# Patient Record
Sex: Female | Born: 1955
Health system: Southern US, Community
[De-identification: ages and names within clinical notes are randomized; demographics above are authoritative.]

## PROBLEM LIST (undated history)

## (undated) DIAGNOSIS — K635 Polyp of colon: Secondary | ICD-10-CM

## (undated) DIAGNOSIS — M779 Enthesopathy, unspecified: Secondary | ICD-10-CM

## (undated) DIAGNOSIS — N611 Abscess of the breast and nipple: Secondary | ICD-10-CM

## (undated) DIAGNOSIS — Z0000 Encounter for general adult medical examination without abnormal findings: Secondary | ICD-10-CM

## (undated) DIAGNOSIS — H539 Unspecified visual disturbance: Secondary | ICD-10-CM

## (undated) DIAGNOSIS — M79672 Pain in left foot: Secondary | ICD-10-CM

## (undated) DIAGNOSIS — D126 Benign neoplasm of colon, unspecified: Secondary | ICD-10-CM

## (undated) DIAGNOSIS — E162 Hypoglycemia, unspecified: Secondary | ICD-10-CM

## (undated) DIAGNOSIS — D649 Anemia, unspecified: Secondary | ICD-10-CM

## (undated) DIAGNOSIS — M1990 Unspecified osteoarthritis, unspecified site: Secondary | ICD-10-CM

## (undated) DIAGNOSIS — F329 Major depressive disorder, single episode, unspecified: Secondary | ICD-10-CM

## (undated) DIAGNOSIS — T7840XA Allergy, unspecified, initial encounter: Secondary | ICD-10-CM

## (undated) DIAGNOSIS — R0683 Snoring: Secondary | ICD-10-CM

## (undated) DIAGNOSIS — H269 Unspecified cataract: Secondary | ICD-10-CM

## (undated) DIAGNOSIS — I10 Essential (primary) hypertension: Secondary | ICD-10-CM

## (undated) DIAGNOSIS — K219 Gastro-esophageal reflux disease without esophagitis: Secondary | ICD-10-CM

## (undated) DIAGNOSIS — F32A Depression, unspecified: Secondary | ICD-10-CM

## (undated) DIAGNOSIS — L989 Disorder of the skin and subcutaneous tissue, unspecified: Secondary | ICD-10-CM

## (undated) HISTORY — DX: Unspecified visual disturbance: H53.9

## (undated) HISTORY — DX: Essential (primary) hypertension: I10

## (undated) HISTORY — DX: Polyp of colon: K63.5

## (undated) HISTORY — DX: Hypoglycemia, unspecified: E16.2

## (undated) HISTORY — DX: Snoring: R06.83

## (undated) HISTORY — PX: OTHER SURGICAL HISTORY: SHX169

## (undated) HISTORY — DX: Benign neoplasm of colon, unspecified: D12.6

## (undated) HISTORY — PX: POLYPECTOMY: SHX149

## (undated) HISTORY — DX: Unspecified osteoarthritis, unspecified site: M19.90

## (undated) HISTORY — PX: FRACTURE SURGERY: SHX138

## (undated) HISTORY — DX: Abscess of the breast and nipple: N61.1

## (undated) HISTORY — PX: EYE SURGERY: SHX253

## (undated) HISTORY — DX: Anemia, unspecified: D64.9

## (undated) HISTORY — DX: Gastro-esophageal reflux disease without esophagitis: K21.9

## (undated) HISTORY — DX: Enthesopathy, unspecified: M77.9

## (undated) HISTORY — PX: SPINE SURGERY: SHX786

## (undated) HISTORY — DX: Allergy, unspecified, initial encounter: T78.40XA

## (undated) HISTORY — PX: FOOT SURGERY: SHX648

## (undated) HISTORY — PX: CATARACT EXTRACTION: SUR2

## (undated) HISTORY — DX: Unspecified cataract: H26.9

## (undated) HISTORY — DX: Encounter for general adult medical examination without abnormal findings: Z00.00

## (undated) HISTORY — PX: CARPAL TUNNEL RELEASE: SHX101

## (undated) HISTORY — PX: COLONOSCOPY: SHX174

## (undated) HISTORY — PX: NASAL SEPTUM SURGERY: SHX37

## (undated) HISTORY — DX: Disorder of the skin and subcutaneous tissue, unspecified: L98.9

## (undated) HISTORY — DX: Pain in left foot: M79.672

---

## 1997-08-24 ENCOUNTER — Ambulatory Visit (HOSPITAL_COMMUNITY): Admission: RE | Admit: 1997-08-24 | Discharge: 1997-08-24 | Payer: Self-pay | Admitting: Orthopedic Surgery

## 1999-01-23 ENCOUNTER — Other Ambulatory Visit: Admission: RE | Admit: 1999-01-23 | Discharge: 1999-01-23 | Payer: Self-pay | Admitting: *Deleted

## 1999-02-13 ENCOUNTER — Encounter: Payer: Self-pay | Admitting: Family Medicine

## 1999-02-13 ENCOUNTER — Ambulatory Visit (HOSPITAL_COMMUNITY): Admission: RE | Admit: 1999-02-13 | Discharge: 1999-02-13 | Payer: Self-pay | Admitting: Family Medicine

## 1999-03-16 ENCOUNTER — Other Ambulatory Visit: Admission: RE | Admit: 1999-03-16 | Discharge: 1999-03-16 | Payer: Self-pay | Admitting: *Deleted

## 1999-03-16 ENCOUNTER — Encounter (INDEPENDENT_AMBULATORY_CARE_PROVIDER_SITE_OTHER): Payer: Self-pay | Admitting: Specialist

## 2000-02-18 ENCOUNTER — Encounter: Payer: Self-pay | Admitting: *Deleted

## 2000-02-18 ENCOUNTER — Ambulatory Visit (HOSPITAL_COMMUNITY): Admission: RE | Admit: 2000-02-18 | Discharge: 2000-02-18 | Payer: Self-pay | Admitting: *Deleted

## 2000-04-24 ENCOUNTER — Other Ambulatory Visit: Admission: RE | Admit: 2000-04-24 | Discharge: 2000-04-24 | Payer: Self-pay | Admitting: *Deleted

## 2000-06-24 HISTORY — PX: ABDOMINAL HYSTERECTOMY: SHX81

## 2000-10-10 ENCOUNTER — Other Ambulatory Visit: Admission: RE | Admit: 2000-10-10 | Discharge: 2000-10-10 | Payer: Self-pay | Admitting: *Deleted

## 2000-10-10 ENCOUNTER — Encounter (INDEPENDENT_AMBULATORY_CARE_PROVIDER_SITE_OTHER): Payer: Self-pay | Admitting: Specialist

## 2000-11-20 ENCOUNTER — Encounter (INDEPENDENT_AMBULATORY_CARE_PROVIDER_SITE_OTHER): Payer: Self-pay | Admitting: Specialist

## 2000-11-20 ENCOUNTER — Observation Stay (HOSPITAL_COMMUNITY): Admission: RE | Admit: 2000-11-20 | Discharge: 2000-11-21 | Payer: Self-pay | Admitting: *Deleted

## 2001-04-07 ENCOUNTER — Encounter: Payer: Self-pay | Admitting: *Deleted

## 2001-04-07 ENCOUNTER — Ambulatory Visit (HOSPITAL_COMMUNITY): Admission: RE | Admit: 2001-04-07 | Discharge: 2001-04-07 | Payer: Self-pay | Admitting: *Deleted

## 2001-05-11 ENCOUNTER — Other Ambulatory Visit: Admission: RE | Admit: 2001-05-11 | Discharge: 2001-05-11 | Payer: Self-pay | Admitting: *Deleted

## 2002-04-08 ENCOUNTER — Ambulatory Visit (HOSPITAL_COMMUNITY): Admission: RE | Admit: 2002-04-08 | Discharge: 2002-04-08 | Payer: Self-pay | Admitting: *Deleted

## 2002-04-08 ENCOUNTER — Encounter: Payer: Self-pay | Admitting: *Deleted

## 2002-05-06 ENCOUNTER — Other Ambulatory Visit: Admission: RE | Admit: 2002-05-06 | Discharge: 2002-05-06 | Payer: Self-pay | Admitting: *Deleted

## 2003-04-18 ENCOUNTER — Ambulatory Visit (HOSPITAL_COMMUNITY): Admission: RE | Admit: 2003-04-18 | Discharge: 2003-04-18 | Payer: Self-pay | Admitting: *Deleted

## 2003-04-18 ENCOUNTER — Encounter: Payer: Self-pay | Admitting: *Deleted

## 2003-05-16 ENCOUNTER — Other Ambulatory Visit: Admission: RE | Admit: 2003-05-16 | Discharge: 2003-05-16 | Payer: Self-pay | Admitting: *Deleted

## 2004-05-29 ENCOUNTER — Ambulatory Visit (HOSPITAL_COMMUNITY): Admission: RE | Admit: 2004-05-29 | Discharge: 2004-05-29 | Payer: Self-pay | Admitting: *Deleted

## 2004-05-30 ENCOUNTER — Other Ambulatory Visit: Admission: RE | Admit: 2004-05-30 | Discharge: 2004-05-30 | Payer: Self-pay | Admitting: *Deleted

## 2004-12-03 ENCOUNTER — Ambulatory Visit: Payer: Self-pay | Admitting: Gastroenterology

## 2004-12-13 ENCOUNTER — Ambulatory Visit: Payer: Self-pay | Admitting: Gastroenterology

## 2004-12-13 LAB — HM COLONOSCOPY

## 2005-08-12 ENCOUNTER — Ambulatory Visit (HOSPITAL_COMMUNITY): Admission: RE | Admit: 2005-08-12 | Discharge: 2005-08-12 | Payer: Self-pay | Admitting: *Deleted

## 2005-08-19 ENCOUNTER — Other Ambulatory Visit: Admission: RE | Admit: 2005-08-19 | Discharge: 2005-08-19 | Payer: Self-pay | Admitting: Gynecology

## 2006-06-20 ENCOUNTER — Encounter: Payer: Self-pay | Admitting: Internal Medicine

## 2006-06-20 LAB — CONVERTED CEMR LAB

## 2006-06-24 DIAGNOSIS — R0683 Snoring: Secondary | ICD-10-CM

## 2006-06-24 HISTORY — DX: Snoring: R06.83

## 2006-08-14 ENCOUNTER — Ambulatory Visit (HOSPITAL_COMMUNITY): Admission: RE | Admit: 2006-08-14 | Discharge: 2006-08-14 | Payer: Self-pay | Admitting: *Deleted

## 2006-08-25 ENCOUNTER — Other Ambulatory Visit: Admission: RE | Admit: 2006-08-25 | Discharge: 2006-08-25 | Payer: Self-pay | Admitting: *Deleted

## 2006-12-22 ENCOUNTER — Ambulatory Visit: Payer: Self-pay | Admitting: Internal Medicine

## 2006-12-22 LAB — CONVERTED CEMR LAB
Albumin: 3.9 g/dL (ref 3.5–5.2)
Alkaline Phosphatase: 69 units/L (ref 39–117)
Basophils Absolute: 0 10*3/uL (ref 0.0–0.1)
Bilirubin, Direct: 0.1 mg/dL (ref 0.0–0.3)
CO2: 31 meq/L (ref 19–32)
Eosinophils Absolute: 0.1 10*3/uL (ref 0.0–0.6)
Eosinophils Relative: 2.6 % (ref 0.0–5.0)
GFR calc non Af Amer: 62 mL/min
HCT: 42.8 % (ref 36.0–46.0)
Hemoglobin, Urine: NEGATIVE
Ketones, ur: NEGATIVE mg/dL
Monocytes Relative: 9.3 % (ref 3.0–11.0)
Neutrophils Relative %: 49 % (ref 43.0–77.0)
Nitrite: NEGATIVE
Platelets: 175 10*3/uL (ref 150–400)
RBC / HPF: NONE SEEN
RDW: 11.3 % — ABNORMAL LOW (ref 11.5–14.6)
TSH: 1.04 microintl units/mL (ref 0.35–5.50)
Total Bilirubin: 0.8 mg/dL (ref 0.3–1.2)
Total CHOL/HDL Ratio: 4.9
Total Protein, Urine: NEGATIVE mg/dL
Total Protein: 6.7 g/dL (ref 6.0–8.3)
Triglycerides: 157 mg/dL — ABNORMAL HIGH (ref 0–149)
Urine Glucose: NEGATIVE mg/dL
Urobilinogen, UA: 0.2 (ref 0.0–1.0)
pH: 7 (ref 5.0–8.0)

## 2007-01-22 ENCOUNTER — Ambulatory Visit: Payer: Self-pay | Admitting: Internal Medicine

## 2007-05-28 ENCOUNTER — Encounter: Payer: Self-pay | Admitting: Internal Medicine

## 2007-05-30 ENCOUNTER — Encounter: Payer: Self-pay | Admitting: Internal Medicine

## 2007-05-30 DIAGNOSIS — H5310 Unspecified subjective visual disturbances: Secondary | ICD-10-CM | POA: Insufficient documentation

## 2007-05-30 DIAGNOSIS — J309 Allergic rhinitis, unspecified: Secondary | ICD-10-CM | POA: Insufficient documentation

## 2007-05-30 DIAGNOSIS — S82899A Other fracture of unspecified lower leg, initial encounter for closed fracture: Secondary | ICD-10-CM | POA: Insufficient documentation

## 2007-05-30 DIAGNOSIS — D126 Benign neoplasm of colon, unspecified: Secondary | ICD-10-CM

## 2007-05-30 DIAGNOSIS — M159 Polyosteoarthritis, unspecified: Secondary | ICD-10-CM | POA: Insufficient documentation

## 2007-05-30 DIAGNOSIS — E162 Hypoglycemia, unspecified: Secondary | ICD-10-CM

## 2007-06-09 ENCOUNTER — Encounter: Payer: Self-pay | Admitting: Internal Medicine

## 2007-07-21 ENCOUNTER — Ambulatory Visit: Payer: Self-pay | Admitting: Internal Medicine

## 2007-07-21 LAB — CONVERTED CEMR LAB
ALT: 16 units/L (ref 0–35)
BUN: 16 mg/dL (ref 6–23)
CO2: 31 meq/L (ref 19–32)
Calcium: 9.5 mg/dL (ref 8.4–10.5)
Cholesterol: 175 mg/dL (ref 0–200)
GFR calc Af Amer: 75 mL/min
Hgb A1c MFr Bld: 4.8 % (ref 4.6–6.0)
Total CHOL/HDL Ratio: 4.2
Triglycerides: 90 mg/dL (ref 0–149)

## 2007-07-24 ENCOUNTER — Ambulatory Visit (HOSPITAL_BASED_OUTPATIENT_CLINIC_OR_DEPARTMENT_OTHER): Admission: RE | Admit: 2007-07-24 | Discharge: 2007-07-24 | Payer: Self-pay | Admitting: Otolaryngology

## 2007-07-27 ENCOUNTER — Ambulatory Visit: Payer: Self-pay | Admitting: Internal Medicine

## 2007-07-27 DIAGNOSIS — IMO0001 Reserved for inherently not codable concepts without codable children: Secondary | ICD-10-CM

## 2007-08-01 ENCOUNTER — Ambulatory Visit: Payer: Self-pay | Admitting: Internal Medicine

## 2007-08-10 ENCOUNTER — Encounter: Payer: Self-pay | Admitting: Internal Medicine

## 2007-08-25 ENCOUNTER — Ambulatory Visit (HOSPITAL_COMMUNITY): Admission: RE | Admit: 2007-08-25 | Discharge: 2007-08-25 | Payer: Self-pay | Admitting: *Deleted

## 2007-08-27 ENCOUNTER — Encounter: Payer: Self-pay | Admitting: Internal Medicine

## 2007-09-03 ENCOUNTER — Encounter: Payer: Self-pay | Admitting: Internal Medicine

## 2007-09-21 ENCOUNTER — Other Ambulatory Visit: Admission: RE | Admit: 2007-09-21 | Discharge: 2007-09-21 | Payer: Self-pay | Admitting: *Deleted

## 2007-10-27 ENCOUNTER — Encounter: Payer: Self-pay | Admitting: Internal Medicine

## 2008-06-27 ENCOUNTER — Encounter: Payer: Self-pay | Admitting: Internal Medicine

## 2008-08-03 ENCOUNTER — Encounter: Payer: Self-pay | Admitting: Internal Medicine

## 2008-08-19 ENCOUNTER — Ambulatory Visit: Payer: Self-pay | Admitting: Internal Medicine

## 2008-08-19 DIAGNOSIS — N951 Menopausal and female climacteric states: Secondary | ICD-10-CM | POA: Insufficient documentation

## 2008-09-05 ENCOUNTER — Ambulatory Visit (HOSPITAL_COMMUNITY): Admission: RE | Admit: 2008-09-05 | Discharge: 2008-09-06 | Payer: Self-pay | Admitting: Gynecology

## 2008-10-10 ENCOUNTER — Ambulatory Visit: Payer: Self-pay | Admitting: Internal Medicine

## 2008-10-10 LAB — CONVERTED CEMR LAB
ALT: 27 units/L (ref 0–35)
AST: 26 units/L (ref 0–37)
Alkaline Phosphatase: 83 units/L (ref 39–117)
Bilirubin, Direct: 0.1 mg/dL (ref 0.0–0.3)
CO2: 30 meq/L (ref 19–32)
Chloride: 109 meq/L (ref 96–112)
HCT: 42.2 % (ref 36.0–46.0)
Lymphs Abs: 1.3 10*3/uL (ref 0.7–4.0)
MCHC: 34.8 g/dL (ref 30.0–36.0)
MCV: 89.6 fL (ref 78.0–100.0)
Monocytes Absolute: 0.4 10*3/uL (ref 0.1–1.0)
Neutrophils Relative %: 52.6 % (ref 43.0–77.0)
Platelets: 142 10*3/uL — ABNORMAL LOW (ref 150.0–400.0)
RBC: 4.71 M/uL (ref 3.87–5.11)
Sodium: 144 meq/L (ref 135–145)
Total Bilirubin: 0.8 mg/dL (ref 0.3–1.2)
Total Protein: 6.6 g/dL (ref 6.0–8.3)
VLDL: 24.6 mg/dL (ref 0.0–40.0)
WBC: 3.7 10*3/uL — ABNORMAL LOW (ref 4.5–10.5)

## 2008-10-17 ENCOUNTER — Ambulatory Visit: Payer: Self-pay | Admitting: Internal Medicine

## 2009-01-16 ENCOUNTER — Ambulatory Visit: Payer: Self-pay | Admitting: Internal Medicine

## 2009-01-16 LAB — CONVERTED CEMR LAB
Basophils Relative: 0.5 % (ref 0.0–3.0)
Eosinophils Relative: 0.9 % (ref 0.0–5.0)
Lymphocytes Relative: 25.5 % (ref 12.0–46.0)
Lymphs Abs: 1.6 10*3/uL (ref 0.7–4.0)
MCV: 89.9 fL (ref 78.0–100.0)
Monocytes Relative: 8.5 % (ref 3.0–12.0)
Neutro Abs: 4 10*3/uL (ref 1.4–7.7)
Platelets: 172 10*3/uL (ref 150.0–400.0)
RDW: 11.8 % (ref 11.5–14.6)

## 2009-01-17 ENCOUNTER — Telehealth: Payer: Self-pay | Admitting: Internal Medicine

## 2009-03-28 ENCOUNTER — Ambulatory Visit (HOSPITAL_BASED_OUTPATIENT_CLINIC_OR_DEPARTMENT_OTHER): Admission: RE | Admit: 2009-03-28 | Discharge: 2009-03-28 | Payer: Self-pay | Admitting: Internal Medicine

## 2009-03-28 ENCOUNTER — Ambulatory Visit: Payer: Self-pay | Admitting: Internal Medicine

## 2009-03-28 ENCOUNTER — Ambulatory Visit: Payer: Self-pay | Admitting: Diagnostic Radiology

## 2009-03-28 DIAGNOSIS — M25559 Pain in unspecified hip: Secondary | ICD-10-CM | POA: Insufficient documentation

## 2009-04-01 ENCOUNTER — Encounter: Admission: RE | Admit: 2009-04-01 | Discharge: 2009-04-01 | Payer: Self-pay | Admitting: Internal Medicine

## 2009-04-02 ENCOUNTER — Telehealth: Payer: Self-pay | Admitting: Internal Medicine

## 2009-04-02 DIAGNOSIS — IMO0002 Reserved for concepts with insufficient information to code with codable children: Secondary | ICD-10-CM

## 2009-04-13 ENCOUNTER — Encounter: Payer: Self-pay | Admitting: Internal Medicine

## 2009-05-31 ENCOUNTER — Encounter: Payer: Self-pay | Admitting: Internal Medicine

## 2009-06-05 ENCOUNTER — Encounter: Payer: Self-pay | Admitting: Internal Medicine

## 2009-06-14 ENCOUNTER — Inpatient Hospital Stay (HOSPITAL_COMMUNITY): Admission: RE | Admit: 2009-06-14 | Discharge: 2009-06-15 | Payer: Self-pay | Admitting: *Deleted

## 2009-06-15 HISTORY — PX: CERVICAL FUSION: SHX112

## 2009-06-26 ENCOUNTER — Telehealth: Payer: Self-pay | Admitting: Internal Medicine

## 2009-08-01 ENCOUNTER — Encounter: Payer: Self-pay | Admitting: Internal Medicine

## 2009-09-19 ENCOUNTER — Ambulatory Visit (HOSPITAL_COMMUNITY): Admission: RE | Admit: 2009-09-19 | Discharge: 2009-09-19 | Payer: Self-pay | Admitting: Internal Medicine

## 2009-09-19 LAB — HM MAMMOGRAPHY: HM Mammogram: NORMAL

## 2009-10-24 ENCOUNTER — Telehealth: Payer: Self-pay | Admitting: Internal Medicine

## 2009-11-13 ENCOUNTER — Ambulatory Visit: Payer: Self-pay | Admitting: Internal Medicine

## 2009-11-13 DIAGNOSIS — G5600 Carpal tunnel syndrome, unspecified upper limb: Secondary | ICD-10-CM | POA: Insufficient documentation

## 2009-11-13 DIAGNOSIS — M546 Pain in thoracic spine: Secondary | ICD-10-CM | POA: Insufficient documentation

## 2009-11-29 ENCOUNTER — Ambulatory Visit: Payer: Self-pay | Admitting: Family

## 2009-11-30 ENCOUNTER — Telehealth: Payer: Self-pay | Admitting: Family

## 2009-12-18 ENCOUNTER — Ambulatory Visit: Payer: Self-pay | Admitting: Internal Medicine

## 2009-12-18 ENCOUNTER — Emergency Department (HOSPITAL_BASED_OUTPATIENT_CLINIC_OR_DEPARTMENT_OTHER): Admission: EM | Admit: 2009-12-18 | Discharge: 2009-12-18 | Payer: Self-pay | Admitting: Emergency Medicine

## 2009-12-18 ENCOUNTER — Ambulatory Visit: Payer: Self-pay | Admitting: Diagnostic Radiology

## 2009-12-18 DIAGNOSIS — M25539 Pain in unspecified wrist: Secondary | ICD-10-CM

## 2010-01-02 ENCOUNTER — Telehealth: Payer: Self-pay | Admitting: Internal Medicine

## 2010-01-09 ENCOUNTER — Encounter (INDEPENDENT_AMBULATORY_CARE_PROVIDER_SITE_OTHER): Payer: Self-pay | Admitting: *Deleted

## 2010-02-09 ENCOUNTER — Ambulatory Visit: Payer: Self-pay | Admitting: Internal Medicine

## 2010-02-15 ENCOUNTER — Telehealth: Payer: Self-pay | Admitting: Internal Medicine

## 2010-03-02 ENCOUNTER — Ambulatory Visit: Payer: Self-pay | Admitting: Diagnostic Radiology

## 2010-03-02 ENCOUNTER — Ambulatory Visit (HOSPITAL_BASED_OUTPATIENT_CLINIC_OR_DEPARTMENT_OTHER): Admission: RE | Admit: 2010-03-02 | Discharge: 2010-03-02 | Payer: Self-pay | Admitting: Internal Medicine

## 2010-03-02 ENCOUNTER — Ambulatory Visit: Payer: Self-pay | Admitting: Internal Medicine

## 2010-03-02 LAB — CONVERTED CEMR LAB: IgE (Immunoglobulin E), Serum: 23.9 intl units/mL (ref 0.0–180.0)

## 2010-03-06 ENCOUNTER — Telehealth: Payer: Self-pay | Admitting: Internal Medicine

## 2010-04-02 ENCOUNTER — Encounter: Payer: Self-pay | Admitting: Internal Medicine

## 2010-05-02 ENCOUNTER — Ambulatory Visit: Payer: Self-pay | Admitting: Internal Medicine

## 2010-05-02 DIAGNOSIS — R635 Abnormal weight gain: Secondary | ICD-10-CM

## 2010-05-02 DIAGNOSIS — I10 Essential (primary) hypertension: Secondary | ICD-10-CM

## 2010-06-24 DIAGNOSIS — L989 Disorder of the skin and subcutaneous tissue, unspecified: Secondary | ICD-10-CM

## 2010-06-24 HISTORY — DX: Disorder of the skin and subcutaneous tissue, unspecified: L98.9

## 2010-07-15 ENCOUNTER — Encounter: Payer: Self-pay | Admitting: Internal Medicine

## 2010-07-24 ENCOUNTER — Telehealth (INDEPENDENT_AMBULATORY_CARE_PROVIDER_SITE_OTHER): Payer: Self-pay | Admitting: *Deleted

## 2010-07-26 NOTE — Progress Notes (Signed)
Summary: Pt feeling better  Phone Note Call from Patient Call back at Home Phone [phone]   Caller: Patient Summary of Call: Pt called, stated is feeling better after taking effexor pill Initial call taken by: Lannette Donath,  November 30, 2009 3:46 PM

## 2010-07-26 NOTE — Letter (Signed)
Summary: Colonoscopy Letter  [facility]  [address] [place], [place] [address]   Phone: [phone]  Fax: [phone]      January 09, 2010 MRN: [id number]   [facility] [address] [place], [place]  [address]   Dear Ms. Helgeson,   According to your medical record, it is time for you to schedule a Colonoscopy. The American Cancer Society recommends this procedure as a method to detect early colon cancer. Patients with a family history of colon cancer, or a personal history of colon polyps or inflammatory bowel disease are at increased risk.  This letter has beeen generated based on the recommendations made at the time of your procedure. If you feel that in your particular situation this may no longer apply, please contact our office.  Please call our office at [phone] to schedule this appointment or to update your records at your earliest convenience.  Thank you for cooperating with [place] to provide you with the very best care possible.   Sincerely,  Hedwig Morton. Juanda Chance, M.D.  [facility] [phone]
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
## 2010-07-26 NOTE — Letter (Signed)
Summary: [facility]  [facility]   Imported By: Lanelle Bal 08/17/2009 10:29:20  _____________________________________________________________________  External Attachment:    Type:   Image     Comment:   External Document

## 2010-07-26 NOTE — Assessment & Plan Note (Signed)
Summary: FOLLOW UP / TF,CMA   Vital Signs:  Patient profile:   55 year old female Height:      64 inches Weight:      191.50 pounds BMI:     32.99 Temp:     98.2 degrees F oral Pulse rate:   81 / minute Pulse rhythm:   regular Resp:     18 per minute BP sitting:   135 / 95  (left arm)  Vitals Entered By: Glendell Docker CMA (Nov 13, 2009 4:15 PM) CC: Rm 2- Follow up , Back pain   Primary Care Provider:  DThomos Lemons DO  CC:  Rm 2- Follow up  and Back pain.  History of Present Illness:  Back Pain      This is a Julie Gray who presents with Back pain.  The pain is located in the left thoracic back.  The pain began gradually.  The pain is made worse by activity.  Risk factors for serious underlying conditions include duration of pain > 1 month.    Int hx - neck surgery in 05/2009  hot flashes - better with effexor  she also c/o intermittent right hand numbness.   symtpoms worse at night  Allergies: 1)  ! Vivelle-Dot (Estradiol)  Past History:  Past Medical History: Abnormal gluocose Osteoarthritis (knees)  Hypoglycemia   Visual flashes - referred to [facility] eye care (Normal funduscopic exam) Allergic Rhinitis Snoring (neg sleep study of OSA - 2008)  Colon Polyps Hot flashes  Past Surgical History: Hysterectomy 2002 Colonoscopy 2006    cervical fusion Right-sided C6-C7 radiculopathy status post   anterior cervical diskectomy and fusion at the C5-6 and C6-7 levels.  Estill Bamberg, MD 06/15/2009      Family History: Mother - Theresa Mulligan - history of CVA,  colon cancer. 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   Lungs:  normal respiratory effort and normal breath sounds.   Heart:  normal rate, regular rhythm, and no gallop.   Msk:  somatic dysfunction T7-9 (rotated  left) positive phalens and tinels Neurologic:  cranial nerves II-XII intact and gait normal.   Psych:  normally interactive and good eye contact.     Impression & Recommendations:  Problem # 1:  BACK PAIN, THORACIC REGION (ICD-724.1)  55 y.o with left side thoracic back pain due to somatic dysfunction.   discomfort with movement and limitation in ROM  utilized myofacial release and HVLA to thoracic spine.  she tolerated well with immediate relief discussed need for improved posture with prolonged sitting  Orders: OMT 1-2 [place] ([address])  Problem # 2:  CARPAL TUNNEL SYNDROME, RIGHT (ICD-354.0) right hand numbness and forearm pain c/w CTS.  Trial of wrist splint and stretching exercises.  if persistent symptoms, refer to Dr. Teressa Senter  Complete Medication List: 1)  Calcium-magnesium-zinc 1000-400-15 Mg Tabs (Calcium-magnesium-zinc) .... Take 1 tablet by mouth once a day 2)  Soy Isoflavones 40 Mg Tabs (Soy isoflavone) .... Take 1 tablet by mouth two times a day 3)  Glucosamine-chondroitin 1500-1200 Mg/46ml Liqd (Glucosamine-chondroitin) .... Take 1 tablet by mouth two times a day 4)  Caltrate 600+d 600-400 Mg-unit Tabs (Calcium carbonate-vitamin d) .... Take 1 tablet by mouth two times a day 5)  Vitamin C 1000 Mg Tabs (Ascorbic acid) .... Take 1  tablet by mouth once a day 6)  Fish Oil 1000 Mg Caps (Omega-3 fatty acids) .... Take 2 tablet by mouth two times a day 7)  Flaxseed Oil 1200 Mg Caps (Flaxseed (linseed)) .... Take 1 tablet by mouth two times a day 8)  Gabapentin 300 Mg Tabs (Gabapentin) .... Take 1 tablet by mouth two times a day 9)  Multivitamins Tabs (Multiple vitamin) .... Take 1 tablet by mouth once a day 10)  Effexor Xr 75 Mg Xr24h-cap (Venlafaxine hcl) .... One by mouth qd 11)  Zinc 50 Mg Tabs (Zinc) .... Take 1 tablet by mouth once a day  Patient Instructions: 1)  Please schedule a follow-up appointment in 6 months. 2)  Use right wrist splint every night as directed  for 1 month 3)  [email or web address] 4)  Limit your calories to 1300-1400 cal per day 5)  Limit carbohydrate intake to 1300-1400 cal per day Prescriptions: GABAPENTIN 300 MG  TABS (GABAPENTIN) Take 1 tablet by mouth two times a day  #180 x 3   Entered and Authorized by:   D. Thomos Lemons DO   Signed by:   D. Thomos Lemons DO on 11/13/2009   Method used:   Print then Give to Patient   RxID:   [id number] EFFEXOR XR 75 MG XR24H-CAP (VENLAFAXINE HCL) one by mouth qd  #90 x 3   Entered and Authorized by:   D. Thomos Lemons DO   Signed by:   D. Thomos Lemons DO on 11/13/2009   Method used:   Print then Give to Patient   RxID:   [id number]

## 2010-07-26 NOTE — Assessment & Plan Note (Signed)
Summary: bronchitis?/mhf   Vital Signs:  Patient profile:   55 year old female Height:      64 inches Weight:      192.75 pounds BMI:     33.21 O2 Sat:      98 % on Room air Temp:     98.3 degrees F oral Pulse rate:   80 / minute Pulse rhythm:   regular Resp:     20 per minute BP sitting:   110 / 80  (right arm) Cuff size:   large  Vitals Entered By: Glendell Docker CMA (February 09, 2010 1:12 PM)  O2 Flow:  Room air CC: Sinus & Cough Is Patient Diabetic? No Pain Assessment Patient in pain? no        Primary Care [place] Files:  Dondra Spry DO  CC:  Sinus & Cough.  History of Present Illness: 55 y/o white female c/o prod cough onset of symptoms Sunday nasal congestion tried mucinex DM - worse cough is productive of yellowish sputum no fever or shortness of breath  intermittent allergy symptoms heartburn - 3 to 4 x per month  no hx of asthma no hx of tob use   Preventive Screening-Counseling & Management  Alcohol-Tobacco     Smoking Status: never  Allergies: 1)  ! Vivelle-Dot (Estradiol)  Past History:  Past Medical History: Abnormal gluocose Osteoarthritis (knees)   Hypoglycemia   Visual flashes - referred to [facility] eye care (Normal funduscopic exam) Allergic Rhinitis Snoring (neg sleep study of OSA - 2008)  Colon Polyps Hot flashes   Family History: Mother - Theresa Mulligan - history of CVA,  colon cancer. 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   Lungs:  normal respiratory effort and slight coarse breath sounds bilaterally Heart:  normal rate, regular rhythm, and no gallop.     Impression & Recommendations:  Problem # 1:  ACUTE BRONCHITIS (ICD-466.0)  Her updated medication list for this problem includes:    Cefuroxime Axetil 500 Mg  Tabs (Cefuroxime axetil) ..... One by mouth two times a day    Azithromycin 250 Mg Tabs (Azithromycin) .Marland Kitchen... 2 tabs on day one, then one by mouth once daily x 4 days    Dulera 100-5 Mcg/act Aero (Mometasone furo-formoterol fum) .Marland Kitchen... 2 puffs two times a day  Take antibiotics and other medications as directed. Encouraged to push clear liquids, get enough rest, and take acetaminophen as needed. To be seen in 5-7 days if no improvement, sooner if worse.  Complete Medication List: 1)  Calcium-magnesium-zinc 1000-400-15 Mg Tabs (Calcium-magnesium-zinc) .... Take 1 tablet by mouth once a day 2)  Soy Isoflavones 40 Mg Tabs (Soy isoflavone) .... Take 1 tablet by mouth two times a day 3)  Glucosamine-chondroitin 1500-1200 Mg/43ml Liqd (Glucosamine-chondroitin) .... Take 1 tablet by mouth two times a day 4)  Caltrate 600+d 600-400 Mg-unit Tabs (Calcium carbonate-vitamin d) .... Take 1 tablet by mouth two times a day 5)  Vitamin C 1000 Mg Tabs (Ascorbic acid) .... Take 1 tablet by mouth once a day 6)  Fish Oil 1000 Mg Caps (Omega-3 fatty acids) .... Take 2 tablet by mouth two times a day 7)  Flaxseed Oil 1200 Mg Caps (Flaxseed (linseed)) .... Take 1 tablet by mouth two times a day 8)  Gabapentin 300  Mg Tabs (Gabapentin) .... Take 1 tablet by mouth two times a day 9)  Multivitamins Tabs (Multiple vitamin) .... Take 1 tablet by mouth once a day 10)  Effexor Xr 75 Mg Xr24h-cap (Venlafaxine hcl) .... One by mouth qd 11)  Zinc 50 Mg Tabs (Zinc) .... Take 1 tablet by mouth once a day 12)  Cefuroxime Axetil 500 Mg Tabs (Cefuroxime axetil) .... One by mouth two times a day 13)  Azithromycin 250 Mg Tabs (Azithromycin) .... 2 tabs on day one, then one by mouth once daily x 4 days 14)  Dulera 100-5 Mcg/act Aero (Mometasone furo-formoterol fum) .... 2 puffs two times a day  Patient Instructions: 1)  Call our office if your symptoms do not  improve or gets worse. Prescriptions: AZITHROMYCIN 250 MG TABS  (AZITHROMYCIN) 2 tabs on day one, then one by mouth once daily x 4 days  #6 x 0   Entered and Authorized by:   D. Thomos Lemons DO   Signed by:   D. Thomos Lemons DO on 02/09/2010   Method used:   Electronically to        [facility]* (retail)       [address]       [place], [place]  [address]       Ph: [phone]       Fax: [phone]   RxID:   [id number] CEFUROXIME AXETIL 500 MG TABS (CEFUROXIME AXETIL) one by mouth two times a day  #20 x 0   Entered and Authorized by:   D. Thomos Lemons DO   Signed by:   D. Thomos Lemons DO on 02/09/2010   Method used:   Electronically to        [facility]* (retail)       [address]       [place], [place]  [address]       Ph: [phone]       Fax: [phone]   RxID:   [id number]   Current Allergies (reviewed today): ! VIVELLE-DOT (ESTRADIOL)

## 2010-07-26 NOTE — Progress Notes (Signed)
Summary: Blood in Sputum with cough  Phone Note Call from Patient Call back at [phone]   Caller: Patient Call For: D. Thomos Lemons DO Summary of Call: patient called and left voice message stating she is concerned for the past 2 days she has coughed up specs of bloods. She  states her cough is worse at night, and she has been taking Delsym for relief. She would like to know if she should be concerned with the specs of blood appearing in her sputum. Initial call taken by: Glendell Docker CMA,  February 15, 2010 8:41 AM  Follow-up for Phone Call        this can occur with bronchitis but I suggest pt come in for CXR then OV Follow-up by: D. Thomos Lemons DO,  February 15, 2010 9:28 AM  Additional Follow-up for Phone Call Additional follow up Details #1::        patient advised per Dr Artist Pais instructions. She states she is out of town in [place] today and she will come in tomorrow to have her CXR done. Appointment with Dr Artist Pais scheduled for Thursday at 10:15 am Additional Follow-up by: Glendell Docker CMA,  February 15, 2010 9:42 AM

## 2010-07-26 NOTE — Assessment & Plan Note (Signed)
Summary: FU ON CHEST XRAY/DT   Vital Signs:  Patient profile:   55 year old female Height:      64 inches Weight:      195 pounds BMI:     33.59 O2 Sat:      97 % on Room air Temp:     98.3 degrees F oral Pulse rhythm:   regular Resp:     18 per minute BP sitting:   132 / 90  (left arm) Cuff size:   large  Vitals Entered By: Glendell Docker CMA (March 02, 2010 1:46 PM)  O2 Flow:  Room air CC: follow-up visit Is Patient Diabetic? No Pain Assessment Patient in pain? no        Primary Care Provider:  Dondra Spry DO  CC:  follow-up visit.  History of Present Illness: 55 y/o white female for f/u she still co intermittent cough she could not tolerate dulera inhaler  no shortness of breath no fever or chills cough worse at night and dry environments she started using humidifier in her room which has helped She states she still has a productive cough yellow in color  Allergies: 1)  ! Vivelle-Dot (Estradiol)  Past History:  Past Medical History: Abnormal gluocose Osteoarthritis (knees)    Hypoglycemia    Visual flashes - referred to [facility] eye care (Normal funduscopic exam) Allergic Rhinitis Snoring (neg sleep study of OSA - 2008)  Colon Polyps Hot flashes   Past Surgical History: Hysterectomy 2002 Colonoscopy 2006     cervical fusion Right-sided C6-C7 radiculopathy status post    anterior cervical diskectomy and fusion at the C5-6 and C6-7 levels.  Estill Bamberg, MD 06/15/2009       Family History: Mother - Theresa Mulligan - history of CVA,  colon cancer. 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   Lungs:  normal respiratory effort, normal breath sounds, and no wheezes.   Heart:  normal rate, regular rhythm,  and no gallop.     Impression & Recommendations:  Problem # 1:  COUGH (ICD-786.2) I suspect chronic cough from post nasal gtt / allergies flare tx with solumedrol  use nasonex regularly  Orders: T- * Misc. Laboratory test [phone]) Admin of Therapeutic Inj  intramuscular or subcutaneous ([address]213) Solumedrol up to 125mg  (Y8657)  Complete Medication List: 1)  Calcium-magnesium-zinc 1000-400-15 Mg Tabs (Calcium-magnesium-zinc) .... Take 1 tablet by mouth once a day 2)  Soy Isoflavones 40 Mg Tabs (Soy isoflavone) .... Take 1 tablet by mouth two times a day 3)  Glucosamine-chondroitin 1500-1200 Mg/47ml Liqd (Glucosamine-chondroitin) .... Take 1 tablet by mouth two times a day 4)  Caltrate 600+d 600-400 Mg-unit Tabs (Calcium carbonate-vitamin d) .... Take 1 tablet by mouth two times a day 5)  Vitamin C 1000 Mg Tabs (Ascorbic acid) .... Take 1 tablet by mouth once a day 6)  Fish Oil 1000 Mg Caps (Omega-3 fatty acids) .... Take 2 tablet by mouth two times a day 7)  Flaxseed Oil 1200 Mg Caps (Flaxseed (linseed)) .... Take 1 tablet by mouth two times a day 8)  Gabapentin 300 Mg Tabs (Gabapentin) .... Take 1 tablet by mouth two times a day 9)  Multivitamins  Tabs (Multiple vitamin) .... Take 1 tablet by mouth once a day 10)  Effexor Xr 75 Mg Xr24h-cap (Venlafaxine hcl) .... One by mouth qd 11)  Zinc 50 Mg Tabs (Zinc) .... Take 1 tablet by mouth once a day 12)  Allegra Allergy 180 Mg Tabs (Fexofenadine hcl) .... Take 1 tab by mouth at bedtime 13)  Omeprazole Magnesium 20.6 (20 Base) Mg Cpdr (Omeprazole magnesium) .... Take 1 capsule by mouth once a day 30 minutes before breakfeast 14)  Nasonex 50 Mcg/act Susp (Mometasone furoate) .... 2 sprays each nostril once daily  Patient Instructions: 1)  Please schedule a follow-up appointment in 2 months. 2)  Call our office if your symptoms do not  improve or gets worse. Prescriptions: NASONEX 50 MCG/ACT SUSP (MOMETASONE FUROATE) 2 sprays each nostril once  daily  #1 x 2   Entered and Authorized by:   D. Thomos Lemons DO   Signed by:   D. Thomos Lemons DO on 03/02/2010   Method used:   Electronically to        [facility] (retail)       [address]       [place], [place]  [address]       Ph: [phone]       Fax: [phone]   RxID:   [id number]   Current Allergies (reviewed today): ! VIVELLE-DOT (ESTRADIOL)   Medication Administration  Injection # 1:    Medication: Solumedrol up to 125mg     Diagnosis: COUGH (ICD-786.2)    Route: IM    Site: RUOQ gluteus    Exp Date: 02/22/2012    Lot #: [id number]    Mfr: Pharmacia    Patient tolerated injection without complications    Given by: Glendell Docker CMA (March 02, 2010 2:48 PM)  Orders Added: 1)  T- * Misc. Laboratory test [phone] 2)  Admin of Therapeutic Inj  intramuscular or subcutaneous [96372] 3)  Solumedrol up to 125mg  [J2930] 4)  Est. Patient Level III [87564]

## 2010-07-26 NOTE — Assessment & Plan Note (Signed)
Summary: DIZZY AND NAUSEATED/[facility]--Rm 4   Vital Signs:  Patient profile:   55 year old female Height:      64 inches Weight:      191 pounds BMI:     32.90 Temp:     97.8 degrees F oral Pulse rate:   78 / minute Pulse rhythm:   regular Resp:     16 per minute BP sitting:   134 / 62  (right arm) Cuff size:   regular  Vitals Entered By: Mervin Kung CMA (November 29, 2009 3:41 PM) CC: Room 4  Pt states she woke up yesterday with dizziness and nausea. Symptoms persist today. Reports diarrhea started today.  Has been out of Effexor since Saturday, needs refill., Headache Is Patient Diabetic? No   Primary Care Provider:  Dondra Spry DO  CC:  Room 4  Pt states she woke up yesterday with dizziness and nausea. Symptoms persist today. Reports diarrhea started today.  Has been out of Effexor since Saturday, needs refill., and Headache.  History of Present Illness: Julie Gray is a 55 year old female who presents today with complaint of Dizziness, nausea, fatigue, and diarrhea.  Notes that her last dose of Effexor was saturday night.  Symptoms started yesterday morning.  Allergies: 1)  ! Vivelle-Dot (Estradiol)  Physical Exam  General:  Well-developed,well-nourished,in no acute distress; alert,appropriate and cooperative throughout examination Lungs:  Normal respiratory effort, chest expands symmetrically. Lungs are clear to auscultation, no crackles or wheezes. Heart:  Normal rate and regular rhythm. S1 and S2 normal without gallop, murmur, click, rub or other extra sounds. Neurologic:  No cranial nerve deficits noted. Station and gait are normal. Plantar reflexes are down-going bilaterally. DTRs are symmetrical throughout. Sensory, motor and coordinative functions appear intact.   Impression & Recommendations:  Problem # 1:  DRUG WITHDRAWAL (ICD-292.0) Suspect that all of patient's symptoms can be related to Effexor withdrawal.  She is awaiting mail order.  Requests 1 week supply to  be sent to her local pharmacy to carry her until order arrives.  Patient was instructed to call if symptoms worsen or do not improve after resuming effexor.  Also instructed patient to avoid abrupt discontinuation of this medication.  Complete Medication List: 1)  Calcium-magnesium-zinc 1000-400-15 Mg Tabs (Calcium-magnesium-zinc) .... Take 1 tablet by mouth once a day 2)  Soy Isoflavones 40 Mg Tabs (Soy isoflavone) .... Take 1 tablet by mouth two times a day 3)  Glucosamine-chondroitin 1500-1200 Mg/14ml Liqd (Glucosamine-chondroitin) .... Take 1 tablet by mouth two times a day 4)  Caltrate 600+d 600-400 Mg-unit Tabs (Calcium carbonate-vitamin d) .... Take 1 tablet by mouth two times a day 5)  Vitamin C 1000 Mg Tabs (Ascorbic acid) .... Take 1 tablet by mouth once a day 6)  Fish Oil 1000 Mg Caps (Omega-3 fatty acids) .... Take 2 tablet by mouth two times a day 7)  Flaxseed Oil 1200 Mg Caps (Flaxseed (linseed)) .... Take 1 tablet by mouth two times a day 8)  Gabapentin 300 Mg Tabs (Gabapentin) .... Take 1 tablet by mouth two times a day 9)  Multivitamins Tabs (Multiple vitamin) .... Take 1 tablet by mouth once a day 10)  Effexor Xr 75 Mg Xr24h-cap (Venlafaxine hcl) .... One by mouth qd 11)  Zinc 50 Mg Tabs (Zinc) .... Take 1 tablet by mouth once a day  Patient Instructions: 1)  Please call if your symptoms worsen or do not improve. Prescriptions: EFFEXOR XR 75 MG XR24H-CAP (VENLAFAXINE HCL)  one by mouth qd  #7 x 0   Entered and Authorized by:   Lemont Fillers FNP   Signed by:   Lemont Fillers FNP on 11/29/2009   Method used:   Print then Give to Patient   RxID:   [id number]   Current Allergies: ! VIVELLE-DOT (ESTRADIOL)

## 2010-07-26 NOTE — Assessment & Plan Note (Signed)
Summary: 2 MONTH FU/DT   Vital Signs:  Patient profile:   55 year old female Height:      64 inches Weight:      199 pounds BMI:     34.28 O2 Sat:      96 % on Room air Temp:     97.9 degrees F oral Pulse rate:   76 / minute Pulse rhythm:   regular Resp:     20 per minute BP sitting:   132 / 90  (right arm) Cuff size:   large  Vitals Entered By: Glendell Docker CMA (May 02, 2010 8:02 AM)  O2 Flow:  Room air CC: 2 Month Follow up Is Patient Diabetic? No Pain Assessment Patient in pain? no      Comments discuss zostavax -husband currently  has   Primary Care Provider:  DThomos Lemons DO  CC:  2 Month Follow up.  History of Present Illness: 55 y/o white female cough completely resolved after finishing steroids  pt notes elevated bp at screening event   Preventive Screening-Counseling & Management  Alcohol-Tobacco     Smoking Status: never  Allergies: 1)  ! Vivelle-Dot (Estradiol)  Past History:  Past Medical History: Osteoarthritis (knees)     Hypoglycemia    Visual flashes - referred to [facility] eye care (Normal funduscopic exam) Allergic Rhinitis Snoring (neg sleep study of OSA - 2008)  Colon Polyps Hot flashes   Past Surgical History: Hysterectomy 2002 Colonoscopy 2006      cervical fusion Right-sided C6-C7 radiculopathy status post    anterior cervical diskectomy and fusion at the C5-6 and C6-7 levels.  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   Lungs:  normal respiratory effort, normal breath sounds, no crackles, and no wheezes.   Heart:  normal rate, regular rhythm, and no gallop.   Extremities:  trace left pedal edema and trace right pedal edema.     Impression & Recommendations:  Problem # 1:  COUGH (ICD-786.2) Assessment  Improved resolved with steroids allergy panel was negative  Problem # 2:  WEIGHT GAIN (ICD-783.1) pt reports exercising less hard to eat right when she is traveling for work follow 1000-1200 cal diet  Problem # 3:  ELEVATED BLOOD PRESSURE WITHOUT DIAGNOSIS OF HYPERTENSION (ICD-796.2) pt reports 140's to 150's SBP at screening event.  pt advised to monitor BP at home. regular exercise and low salt diet recommended  BP today: 132/90 Prior BP: 132/90 (03/02/2010)  Labs Reviewed: Creat: 0.9 (10/10/2008) Chol: 179 (10/10/2008)   HDL: 45.70 (10/10/2008)   LDL: 109 (10/10/2008)   TG: 123.0 (10/10/2008)  Instructed in low sodium diet (DASH Handout) and behavior modification.    Complete Medication List: 1)  Calcium-magnesium-zinc 1000-400-15 Mg Tabs (Calcium-magnesium-zinc) .... Take 1 tablet by mouth once a day 2)  Soy Isoflavones 40 Mg Tabs (Soy isoflavone) .... Take 1 tablet by mouth two times a day 3)  Glucosamine-chondroitin 1500-1200 Mg/2ml Liqd (Glucosamine-chondroitin) .... Take 1 tablet by mouth two times a day 4)  Caltrate 600+d 600-400 Mg-unit Tabs (Calcium carbonate-vitamin d) .... Take 1 tablet by mouth two times a day 5)  Vitamin C 1000 Mg Tabs (Ascorbic acid) .... Take 1 tablet by mouth once a day 6)  Fish Oil 1000 Mg Caps (Omega-3  fatty acids) .... Take 2 tablet by mouth two times a day 7)  Flaxseed Oil 1200 Mg Caps (Flaxseed (linseed)) .... Take 1 tablet by mouth two times a day 8)  Gabapentin 300 Mg Tabs (Gabapentin) .... Take 1 tablet by mouth two times a day 9)  Multivitamins Tabs (Multiple vitamin) .... Take 1 tablet by mouth once a day 10)  Effexor Xr 75 Mg Xr24h-cap (Venlafaxine hcl) .... One by mouth qd 11)  Zinc 50 Mg Tabs (Zinc) .... Take 1 tablet by mouth once a day 12)  Allegra Allergy 180 Mg Tabs (Fexofenadine hcl) .... Take 1 tab by mouth at bedtime 13)  Omeprazole Magnesium 20.6 (20 Base) Mg Cpdr (Omeprazole magnesium) .... Take 1 capsule by mouth once a day  30 minutes before breakfeast 14)  Nasonex 50 Mcg/act Susp (Mometasone furoate) .... 2 sprays each nostril once daily 15)  Zostavax 28413 Unt/0.65ml Solr (Zoster vaccine live) .... Administer vaccine x 1  Other Orders: Flu Vaccine 80yrs + (24401) Admin 1st Vaccine (02725)  Patient Instructions: 1)  Please schedule a follow-up appointment in 3 months. 2)  Monitor your blood pressure with home monitor. Prescriptions: GABAPENTIN 300 MG  TABS (GABAPENTIN) Take 1 tablet by mouth two times a day  #180 x 1   Entered and Authorized by:   D. Thomos Lemons DO   Signed by:   D. Thomos Lemons DO on 05/02/2010   Method used:   Print then Mail to Patient   RxID:   [id number] EFFEXOR XR 75 MG XR24H-CAP (VENLAFAXINE HCL) one by mouth qd  #90 x 1   Entered and Authorized by:   D. Thomos Lemons DO   Signed by:   D. Thomos Lemons DO on 05/02/2010   Method used:   Print then Mail to Patient   RxID:   [id number] ZOSTAVAX 60630 UNT/0.65ML SOLR (ZOSTER VACCINE LIVE) administer vaccine x 1  #1 x 0   Entered and Authorized by:   D. Thomos Lemons DO   Signed by:   D. Thomos Lemons DO on 05/02/2010   Method used:   Print then Give to Patient   RxID:   [id number]  Rx's for Gabepentin and Effexor faxed to [facility] Glendell Docker CMA  May 02, 2010 10:35 AM   Orders Added: 1)  Flu Vaccine 77yrs + [90658] 2)  Admin 1st Vaccine [90471] 3)  Est. Patient Level III [25427]   Immunizations Administered:  Influenza Vaccine # 1:    Vaccine Type: Fluvax 3+    Site: left deltoid    Mfr: GlaxoSmithKline    Dose: 0.5 ml    Route: IM    Given by: Glendell Docker CMA    Exp. Date: 12/22/2010    Lot #: [id number]    VIS given: 01/16/10 version given May 02, 2010.  Flu Vaccine Consent Questions:    Do you have a history of severe allergic reactions to this vaccine? no    Any prior history of allergic reactions to egg and/or gelatin? no    Do you have a sensitivity to the preservative Thimersol? no     Do you have a past history of Guillan-Barre Syndrome? no    Do you currently have an acute febrile illness? no    Have you ever had a severe reaction to latex? no    Vaccine information given and explained to patient? yes    Are you currently pregnant? no   Immunizations Administered:  Influenza Vaccine # 1:  Vaccine Type: Fluvax 3+    Site: left deltoid    Mfr: GlaxoSmithKline    Dose: 0.5 ml    Route: IM    Given by: Glendell Docker CMA    Exp. Date: 12/22/2010    Lot #: [id number]    VIS given: 01/16/10 version given May 02, 2010.     Current Allergies (reviewed today): ! VIVELLE-DOT (ESTRADIOL)

## 2010-07-26 NOTE — Progress Notes (Signed)
Summary: Lab Results  Phone Note Outgoing Call   Summary of Call: call pt - allergy panel is negative Initial call taken by: D. Thomos Lemons DO,  March 06, 2010 6:49 PM  Follow-up for Phone Call        call placed to patient [phone], she has been advised per Dr Artist Pais instructions Follow-up by: Glendell Docker CMA,  March 07, 2010 9:15 AM

## 2010-07-26 NOTE — Progress Notes (Signed)
Summary: refill request  Phone Note Refill Request Message from:  Fax from Pharmacy on June 26, 2009 8:54 AM  Refills Requested: Medication #1:  venlafaxine hcl er 7.5 mg cer   Dosage confirmed as above?Dosage Confirmed   Brand Name Necessary? No   Supply Requested: 1 month  Method Requested: Electronic Next Appointment Scheduled: none Initial call taken by: Roselle Locus,  June 26, 2009 8:55 AM  Follow-up for Phone Call        Patient was last seen on 03/28/09. How many refills allowed? Follow-up by: Lucious Groves,  June 26, 2009 3:35 PM  Additional Follow-up for Phone Call Additional follow up Details #1::        ok to refill x 3 Additional Follow-up by: D. Thomos Lemons DO,  June 26, 2009 4:17 PM    Prescriptions: EFFEXOR XR 75 MG XR24H-CAP (VENLAFAXINE HCL) one by mouth qd  #30 x 3   Entered by:   Lamar Sprinkles, CMA   Authorized by:   D. Thomos Lemons DO   Signed by:   Lamar Sprinkles, CMA on 06/27/2009   Method used:   Electronically to        [facility]* (retail)       [address]       [place], [place]  [address]       Ph: [phone]       Fax: [phone]   RxID:   [id number]

## 2010-07-26 NOTE — Letter (Signed)
Summary: [facility]  [facility]   Imported By: Maryln Gottron 04/20/2010 14:25:37  _____________________________________________________________________  External Attachment:    Type:   Image     Comment:   External Document

## 2010-07-26 NOTE — Progress Notes (Signed)
Summary: Status Update  ---- Converted from flag ---- ---- 01/01/2010 11:03 PM, D. Thomos Lemons DO wrote: call pt - is she feeling better ------------------------------ Phone Note Outgoing Call   Call placed by: Glendell Docker CMA,  January 02, 2010 8:28 AM Summary of Call: call placed to patient  at [phone]. She states she  is feeling much better. Her bronchitis is better, her cough is just about gone, and rib pain is still present, but she is able to move somewhat better than before, and since she no longer is bothered by the cough her pain has improved. She does states with certain movement she still has some discomfort,but is doing much better. Initial call taken by: Glendell Docker CMA,  January 02, 2010 8:32 AM

## 2010-07-26 NOTE — Progress Notes (Signed)
Summary: Effexor Refill  Phone Note Refill Request Message from:  Fax from Pharmacy on Oct 24, 2009 1:09 PM  Refills Requested: Medication #1:  EFFEXOR XR 75 MG XR24H-CAP one by mouth qd.   Dosage confirmed as above?Dosage Confirmed   Brand Name Necessary? No   Supply Requested: 1 month  Method Requested: Electronic Next Appointment Scheduled: None Initial call taken by: Glendell Docker CMA,  Oct 24, 2009 1:09 PM  Follow-up for Phone Call        refill x 2.  needs OV for further refills Follow-up by: D. Thomos Lemons DO,  Oct 24, 2009 1:20 PM  Additional Follow-up for Phone Call Additional follow up Details #1::        Refills sent to pharmacy. Left message on pt's voicemail at home to return my call.  Mervin Kung CMA  Oct 24, 2009 3:11 PM   Advised pt. that refills were sent to pharmacy. Scheduled f/u with Dr. Artist Pais for 11/13/09 @ 3:45pm.  Mervin Kung CMA  Oct 24, 2009 4:26 PM     Prescriptions: EFFEXOR XR 75 MG XR24H-CAP (VENLAFAXINE HCL) one by mouth qd  #30 x 1   Entered by:   Mervin Kung CMA   Authorized by:   D. Thomos Lemons DO   Signed by:   Mervin Kung CMA on 10/24/2009   Method used:   Electronically to        [facility]* (retail)       [address]       [place], [place]  [address]       Ph: [phone]       Fax: [phone]   RxID:   [id number]

## 2010-08-01 NOTE — Progress Notes (Signed)
  Phone Note Other Incoming   Request: Send information Summary of Call: Request for records received from [facility].Request forwarded to [facility]. Complete health history-06/24/2005 to present  Julie Gray

## 2010-08-03 ENCOUNTER — Ambulatory Visit (INDEPENDENT_AMBULATORY_CARE_PROVIDER_SITE_OTHER): Payer: 59 | Admitting: Internal Medicine

## 2010-08-03 ENCOUNTER — Encounter: Payer: Self-pay | Admitting: Internal Medicine

## 2010-08-03 DIAGNOSIS — I10 Essential (primary) hypertension: Secondary | ICD-10-CM

## 2010-08-06 ENCOUNTER — Other Ambulatory Visit: Payer: Self-pay | Admitting: Obstetrics & Gynecology

## 2010-08-13 ENCOUNTER — Other Ambulatory Visit (HOSPITAL_COMMUNITY): Payer: Self-pay | Admitting: Obstetrics & Gynecology

## 2010-08-13 DIAGNOSIS — Z1231 Encounter for screening mammogram for malignant neoplasm of breast: Secondary | ICD-10-CM

## 2010-08-21 NOTE — Assessment & Plan Note (Signed)
Summary: 3 month follow up/mh[facility]   Vital Signs:  Patient profile:   55 year old female Height:      64 inches Weight:      194 pounds BMI:     33.42 O2 Sat:      99 % on Room air Temp:     97.9 degrees F oral Pulse rate:   75 / minute Resp:     22 per minute BP sitting:   154 / 84  (right arm) Cuff size:   large  Vitals Entered By: Glendell Docker CMA (August 03, 2010 8:12 AM)  O2 Flow:  Room air CC: 3 Month follow up  Is Patient Diabetic? No Pain Assessment Patient in pain? no      Comments refills on medications-request printed rx's  , no concerns   Primary Care Provider:  DThomos Lemons DO  CC:  3 Month follow up .  History of Present Illness:  Hypertension Follow-Up      This is a 55 year old woman who presents for Hypertension follow-up.  The patient denies headaches.  The patient denies the following associated symptoms: chest pain.  The patient reports that dietary compliance has been fair.  The patient reports exercising occasionally.    Allergies: 1)  ! Vivelle-Dot (Estradiol)  Past History:  Past Medical History: Osteoarthritis (knees)     Hypoglycemia    Visual flashes - referred to [facility] eye care (Normal funduscopic exam) Allergic Rhinitis Snoring (neg sleep study of OSA - 2008)  Colon Polyps Hot flashes  Precancerous lesion removed from right side of face 06/2010  Past Surgical History: Hysterectomy 2002 Colonoscopy 2006      cervical fusion Right-sided C6-C7 radiculopathy status post    anterior cervical diskectomy and fusion at the C5-6 and C6-7 levels.  Estill Bamberg, MD 06/15/2009  Bilateral CTS surgery - Dr. Ave Filter 05/2010, 06/2010  Family History: Mother - Theresa Mulligan - history of CVA,  colon cancer.   now residing at [facility] Father has history of colon cancer        Physical Exam  General:  alert, well-developed, and well-nourished.   Neck:  supple and no carotid bruits.   Lungs:  normal respiratory effort and  normal breath sounds.   Heart:  normal rate, regular rhythm, and no gallop.   Extremities:  trace left pedal edema and trace right pedal edema.     Impression & Recommendations:  Problem # 1:  HYPERTENSION (ICD-401.9) Assessment Deteriorated no improvement with lifestyle changes.  start ARB Her updated medication list for this problem includes:    Losartan Potassium 25 Mg Tabs (Losartan potassium) ..... One tab by mouth once daily  BP today: 154/84 Prior BP: 132/90 (05/02/2010)  Labs Reviewed: K+: 4.2 (10/10/2008) Creat: : 0.9 (10/10/2008)   Chol: 179 (10/10/2008)   HDL: 45.70 (10/10/2008)   LDL: 109 (10/10/2008)   TG: 123.0 (10/10/2008)  Complete Medication List: 1)  Calcium-magnesium-zinc 1000-400-15 Mg Tabs (Calcium-magnesium-zinc) .... Take 1 tablet by mouth once a day 2)  Soy Isoflavones 40 Mg Tabs (Soy isoflavone) .... Take 1 tablet by mouth two times a day 3)  Glucosamine-chondroitin 1500-1200 Mg/12ml Liqd (Glucosamine-chondroitin) .... Take 1 tablet by mouth two times a day 4)  Caltrate 600+d 600-400 Mg-unit Tabs (Calcium carbonate-vitamin d) .... Take 1 tablet by mouth two times a day 5)  Fish Oil 1000 Mg Caps (Omega-3 fatty acids) .... Take 2 tablet by mouth two times a day 6)  Flaxseed Oil 1200  Mg Caps (Flaxseed (linseed)) .... Take 1 tablet by mouth two times a day 7)  Gabapentin 300 Mg Tabs (Gabapentin) .... Take 1 tablet by mouth two times a day 8)  Multivitamins Tabs (Multiple vitamin) .... Take 1 tablet by mouth once a day 9)  Effexor Xr 75 Mg Xr24h-cap (Venlafaxine hcl) .... One by mouth qd 10)  Zinc 50 Mg Tabs (Zinc) .... Take 1 tablet by mouth once a day 11)  Allegra Allergy 180 Mg Tabs (Fexofenadine hcl) .... Take 1 tab by mouth at bedtime 12)  Omeprazole Magnesium 20.6 (20 Base) Mg Cpdr (Omeprazole magnesium) .... Take 1 capsule by mouth once a day 30 minutes before breakfeast 13)  Nasonex 50 Mcg/act Susp (Mometasone furoate) .... 2 sprays each nostril once  daily 14)  Losartan Potassium 25 Mg Tabs (Losartan potassium) .... One tab by mouth once daily  Patient Instructions: 1)  Please schedule a follow-up appointment in 6 weeks. 2)  BMP prior to visit, ICD-9: 401.9 3)  Lipid Panel prior to visit, ICD-9: 401.9 4)  High sensitivity CRP :  401.9 5)  Please return for lab work one (1) week before your next appointment.  Prescriptions: EFFEXOR XR 75 MG XR24H-CAP (VENLAFAXINE HCL) one by mouth qd  #90 x 1   Entered and Authorized by:   D. Thomos Lemons DO   Signed by:   D. Thomos Lemons DO on 08/03/2010   Method used:   Print then Give to Patient   RxID:   [id number] GABAPENTIN 300 MG  TABS (GABAPENTIN) Take 1 tablet by mouth two times a day  #180 x 1   Entered and Authorized by:   D. Thomos Lemons DO   Signed by:   D. Thomos Lemons DO on 08/03/2010   Method used:   Print then Give to Patient   RxID:   [id number] LOSARTAN POTASSIUM 25 MG TABS (LOSARTAN POTASSIUM) one tab by mouth once daily  #30 x 1   Entered and Authorized by:   D. Thomos Lemons DO   Signed by:   D. Thomos Lemons DO on 08/03/2010   Method used:   Electronically to        [facility]* (retail)       [address]       [place], [place]  [address]       Ph: [phone]       Fax: [phone]   RxID:   [id number]    Orders Added: 1)  Est. Patient Level III [56433]    Current Allergies (reviewed today): ! VIVELLE-DOT (ESTRADIOL)

## 2010-09-04 ENCOUNTER — Encounter: Payer: Self-pay | Admitting: Internal Medicine

## 2010-09-07 ENCOUNTER — Other Ambulatory Visit: Payer: Self-pay | Admitting: Internal Medicine

## 2010-09-07 ENCOUNTER — Other Ambulatory Visit: Payer: 59

## 2010-09-07 ENCOUNTER — Encounter (INDEPENDENT_AMBULATORY_CARE_PROVIDER_SITE_OTHER): Payer: Self-pay | Admitting: *Deleted

## 2010-09-07 DIAGNOSIS — E785 Hyperlipidemia, unspecified: Secondary | ICD-10-CM

## 2010-09-07 DIAGNOSIS — I10 Essential (primary) hypertension: Secondary | ICD-10-CM

## 2010-09-07 LAB — BASIC METABOLIC PANEL
Calcium: 9.3 mg/dL (ref 8.4–10.5)
GFR: 73.88 mL/min (ref >60.00)
Potassium: 4.3 mEq/L (ref 3.5–5.1)
Sodium: 142 mEq/L (ref 135–145)

## 2010-09-07 LAB — LIPID PANEL
Cholesterol: 228 mg/dL — ABNORMAL HIGH (ref 0–200)
HDL: 50 mg/dL (ref >39.00)
VLDL: 21.2 mg/dL (ref 0.0–40.0)

## 2010-09-14 ENCOUNTER — Encounter: Payer: Self-pay | Admitting: Internal Medicine

## 2010-09-14 ENCOUNTER — Ambulatory Visit (INDEPENDENT_AMBULATORY_CARE_PROVIDER_SITE_OTHER): Payer: 59 | Admitting: Internal Medicine

## 2010-09-14 DIAGNOSIS — E785 Hyperlipidemia, unspecified: Secondary | ICD-10-CM

## 2010-09-14 DIAGNOSIS — K219 Gastro-esophageal reflux disease without esophagitis: Secondary | ICD-10-CM | POA: Insufficient documentation

## 2010-09-14 DIAGNOSIS — I10 Essential (primary) hypertension: Secondary | ICD-10-CM

## 2010-09-14 MED ORDER — OMEPRAZOLE 40 MG PO CPDR: 40.0000 mg | DELAYED_RELEASE_CAPSULE | Freq: Every day | ORAL | Status: DC

## 2010-09-14 MED ORDER — LOSARTAN POTASSIUM 50 MG PO TABS: 50.0000 mg | ORAL_TABLET | Freq: Every day | ORAL | Status: DC

## 2010-09-14 NOTE — Progress Notes (Signed)
  Subjective:    [name], female    DOB: Mar 29, 1956, 55 y.o.   MRN: [id number]  Gastrophageal Reflux She complains of belching, heartburn and water brash. She reports no abdominal pain. This is a chronic problem. The current episode started more than 1 month ago. The problem occurs frequently. The heartburn is located in the substernum. The heartburn is of moderate intensity. The symptoms are aggravated by lying down. She has tried a PPI for the symptoms. The treatment provided mild relief.     Int hx :  Seen by Dr. Aldona Bar Pap smear reported normal  Hyperlipidemia -  Breakfast - small bowl of cereal with 2% milk,  shreaded wheat Lunch - some times chicken, pita delight, occ salads Dinner - [employer] ,  [employer]   Htn - home bp readings better but still 135-150's systolic  Review of Systems  Gastrointestinal: Positive for heartburn. Negative for abdominal pain.       Objective:   Physical Exam  Constitutional: She appears well-developed and well-nourished.  Cardiovascular: Normal rate, regular rhythm and normal heart sounds.   Pulmonary/Chest: Effort normal and breath sounds normal.  Abdominal: Soft.  Psychiatric: She has a normal mood and affect. Her behavior is normal.          Assessment & Plan:

## 2010-09-14 NOTE — Assessment & Plan Note (Signed)
Slight improvement but not at goal. Increase losartan to 50 mg Lab Results  Component Value Date   CREATININE 0.9 09/07/2010

## 2010-09-14 NOTE — Assessment & Plan Note (Signed)
Pt having refractory GERD symptoms despite omeprazole I suspect her supplements and current diet exacerbating her symptoms  swtitch to omeprazole 40 mg once daily Anti reflux measures reviewed  If persistent symptoms, consider EGD and testing for H Pylori

## 2010-09-14 NOTE — Assessment & Plan Note (Addendum)
She does not want to take statins Prefers to manage with diet alone but she is having difficulty staying on low sat fat diet She belongs to motorcycle club and weekend trips usually involves fast food CRP is normal Pt counseled on low sat fat diet.  Lipid panel reviewed

## 2010-09-24 ENCOUNTER — Ambulatory Visit (HOSPITAL_COMMUNITY)
Admission: RE | Admit: 2010-09-24 | Discharge: 2010-09-24 | Disposition: A | Discharge status: Home / Self Care | Payer: 59 | Source: Ambulatory Visit | Attending: Obstetrics & Gynecology | Admitting: Obstetrics & Gynecology

## 2010-09-24 DIAGNOSIS — Z1231 Encounter for screening mammogram for malignant neoplasm of breast: Secondary | ICD-10-CM

## 2010-09-24 LAB — DIFFERENTIAL
Basophils Relative: 1 % (ref 0–1)
Eosinophils Absolute: 0.1 10*3/uL (ref 0.0–0.7)
Lymphs Abs: 1.9 10*3/uL (ref 0.7–4.0)
Neutrophils Relative %: 50 % (ref 43–77)

## 2010-09-24 LAB — COMPREHENSIVE METABOLIC PANEL
ALT: 44 U/L — ABNORMAL HIGH (ref 0–35)
AST: 38 U/L — ABNORMAL HIGH (ref 0–37)
CO2: 31 mEq/L (ref 19–32)
Calcium: 10.1 mg/dL (ref 8.4–10.5)
Creatinine, Ser: 1 mg/dL (ref 0.4–1.2)
GFR calc Af Amer: >60 mL/min (ref >60)
GFR calc non Af Amer: 58 mL/min — ABNORMAL LOW (ref >60)
Sodium: 142 mEq/L (ref 135–145)
Total Protein: 7 g/dL (ref 6.0–8.3)

## 2010-09-24 LAB — PROTIME-INR
INR: 0.97 (ref 0.00–1.49)
Prothrombin Time: 12.8 seconds (ref 11.6–15.2)

## 2010-09-24 LAB — URINALYSIS, ROUTINE W REFLEX MICROSCOPIC
Bilirubin Urine: NEGATIVE
Hgb urine dipstick: NEGATIVE
Protein, ur: NEGATIVE mg/dL
Urobilinogen, UA: 0.2 mg/dL (ref 0.0–1.0)

## 2010-09-24 LAB — CBC
MCHC: 35.3 g/dL (ref 30.0–36.0)
MCV: 90.1 fL (ref 78.0–100.0)
RDW: 12.4 % (ref 11.5–15.5)

## 2010-11-06 NOTE — Procedures (Signed)
NAME:  Julie [name], Julie [name]            ACCOUNT NO.:  [id number]   MEDICAL RECORD NO.:  [id number]          PATIENT TYPE:  OUT   LOCATION:  SLEEP CENTER                 [facility]ACILITY[facility]  [facility]   PHYSICIAN:  Clinton D. Maple Hudson, MD, FCCP, FACPDATE OF BIRTH:  September 22, 1955   DATE OF STUDY:  07/24/2007                            NOCTURNAL POLYSOMNOGRAM   REFERRING PHYSICIAN:  Zola Button T. Lazarus Salines, M.D.   INDICATION FOR STUDY:  Hypersomnia with sleep apnea.   EPWORTH SLEEPINESS SCORE:  2/24, BMI 31.2, weight 182 pounds, height 64  inches, neck 14 inches.   MEDICATIONS:  Home medication charted and reviewed.   SLEEP ARCHITECTURE:  Total sleep time 235 minutes with sleep efficiency  65.1%.  Stage I was 20.6%, stage II 59.8%, stage III 7%, REM 12.6% of  total sleep time.  Sleep latency 32.5 minutes, REM latency 128.5  minutes, awake after sleep onset 92 minutes, arousal index 12.8.  No  bedtime medication was taken.   RESPIRATORY DATA:  Apnea-hypopnea index (AHI) 3.1 events per hour, which  is within normal limits.  There were 12 total respiratory events, all  hypopneas, recorded most while sleeping supine.  REM AHI 18.3.   OXYGEN DATA:  Moderately loud snoring with oxygen desaturation to a  nadir of 88%.  Mean oxygen saturation through the study 94.7% on room  air.   CARDIAC DATA:  Sinus rhythm with occasional PVC.   MOVEMENT-PARASOMNIA:  Periodic limb movement syndrome with a total of 12  events, index 3.1 per hour.  No bathroom trips.   IMPRESSIONS-RECOMMENDATIONS:  1. Occasional respiratory events within normal limits, apnea-hypopnea      index 3.1 per hour.  Events were primarily associated with supine      sleep position and rapid eye movement.  Moderately loud snoring      with oxygen desaturation to a nadir of 88 percent.  2. Technician stated that the patient arrived upset, not feeling that      she needed to have a sleep study and      indicating that she did not want to be at the Sleep  Center.  Note      that the patient awoke and ended the study at 3 a.m.      Clinton D. Maple Hudson, MD, FCCP, FACP  Diplomate, Biomedical engineer of Sleep Medicine  Electronically Signed     CDY/MEDQ  D:  08/01/2007 18:00:12  T:  08/03/2007 11:31:12  Job:  [id number]

## 2010-11-06 NOTE — Assessment & Plan Note (Signed)
[facility]                           PRIMARY CARE OFFICE NOTE   NAME:[name], [name]                   MRN:          [id number]  DATE:12/22/2006                            DOB:          June 29, 1955    History and Physical.   CHIEF COMPLAINT:  New patient to practice.   HISTORY OF PRESENT ILLNESS:  The patient is a 55 year old white female,  here to establish primary care.  She was formerly followed by Dr.  Egbert [name] in [place], [place].  Her main concern today has been  history of hypoglycemia, also she has experienced visual disturbances  over the last 2 week's  time period.  She describes flashes of white  light, associated with mild lightheadedness.  She notes she can  appreciate white flashes with eyes open or closed.  In terms of her  previous history of hypoglycemia, she describes blood sugars into the  40s.  She believes she saw an endocrinologist in the past who  recommended frequent meals, 5 to 6 times per day.  Unclear what other  work up  had been performed.   Her other chronic medical problems include osteoarthritis.  She  described chronic neck pain, knee pain.  She was seen by Dr. Corliss [name]  at [facility].  She was initially prescribed Celebrex 3 to 4 years ago.  She  currently takes OTC Naproxen.  She takes 220 mg 2 pills in the morning  and 2 pills at night.  She denies any abdominal discomfort.  She has had  some more fluid retention lately and has had noticed elevations in her  blood pressure.   She also denies any problems with melena or dark stools.   PAST MEDICAL HISTORY SUMMARY:  1. Chronic osteoarthritis, neck and bilateral knees.  2. History of allergic rhinitis.  3. Status post hysterectomy 2002.  4. Left ankle fracture in 1969.  5. History of colon polyps.   CURRENT MEDICATIONS:  1. Naproxen 220 mg 2 b.i.d.  2. Magnesium and Zinc 15 mg once a day.  3. Folic acid 1 a day.  4. Soy Isoflavor 40 mg b.i.d.  5.  Glucosamine chondroitin sulfate 1300 mg b.i.d.  6. Caltrate 600 mg with vitamin D twice  a day.  7. Calcium 1000 mg once a day.  8. Estradiol 1 mg 1/2 p.o. nightly.   ALLERGIES TO MEDICATIONS:  Include VIVELLE DOT which causes rash.   SOCIAL HISTORY:  The patient has been married for the last 32 years.  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   HABITS:  No tobacco, no alcohol.   FAMILY HISTORY:  Mother is alive, has had a history of CVA, high blood  pressure and colon cancer. She is a patient at the [place] office. Father  has a history of colon cancer and alcoholism.   REVIEW OF SYSTEMS:  Denies any significant weight gain, weight loss.  No  chest pain, palpitation, shortness of breath or dyspnea on exertion.  Denies heartburn, nausea, vomiting, constipation, diarrhea.  All other  systems negative.  PHYSICAL EXAMINATION:  VITAL SIGNS:  Height is 5 foot 4 inches.  Weight  is 185 pounds.  Temperature is 97.6, pulse is 78, BP is 147/95 left arm  in a seated position.  GENERAL: The patient is a pleasant, somewhat overweight 55 year old  white female who appears her stated age.  HEENT:  Normocephalic.  Atraumatic.  Pupils are equal and reactive to  light bilaterally.  Extraocular muscles was intact.  Patient was  anicteric.  Conjunctiva was within normal limits.  External and auditory  canals and tympanic membranes are clear  bilaterally.  Oropharyngeal  exam was unremarkable.  NECK:  Supple.  Did not appreciate and carotid bruits, thyromegaly or  adenopathy.  CHEST:  Normal inspiratory effort.  Chest was clear to auscultation  bilaterally.  No rhonchi, no rales, no wheezing.  CARDIOVASCULAR:  Regular rate and rhythm.  No significant murmur, rubs,  gallops appreciated.  ABDOMEN:  Soft, protuberant, nontender, positive bowel sounds.  No  organomegaly.  MUSCULOSKELETAL:  No clubbing, cyanosis or edema.  The  patient had  palpable pedis and dorsalis pulses.  SKIN:  Warm and dry.   IMPRESSIONS:  1. Elevated blood pressure reading without diagnosis of hypertension.  2. Chronic neck pain and bilateral knee pain, presumed secondary to      osteoarthritis.  3. Chronic NSAID use.  4. Visual changes of unclear etiology.  5. History of hypoglycemia.  6. History of colon polyps.  7. Health maintenance.   RECOMMENDATIONS:  1. The patient is to taper of Naproxen.  We will try over-the-counter      fish oil and gabapentin 300 mg p.o. nightly.  She was also given a      prescription for Darvocet N 100 every 8 hours as needed.  2. We will check a uric acid to consider possibility of gout.  3. She will be referred to [facility] regarding abnormal      vision/flashes of light to      rule out retinal detachment or other retinal disease.  4. She is at high risk for nephrotoxicity with NSAIDs and we will send      her for baseline kidney function and LFTs.  Followup time in      approximately 1 month.     Julie [name]. Julie Pais, DO  Electronically Signed    RDY/MedQ  DD: 12/22/2006  DT: 12/22/2006  Job #: [id number]

## 2010-11-09 NOTE — Op Note (Signed)
[facility]  Patient:    Julie Gray, Julie Gray                   MRN: [id number] Proc. Date: 11/20/00 Adm. Date:  [id number] Attending:  Collene Schlichter CC:         Harl Bowie, M.D.   Operative Report  PREOPERATIVE DIAGNOSES:  Abnormal uterine bleeding, pelvic pain, recurring ovarian cysts.  POSTOPERATIVE DIAGNOSES:  Abnormal uterine bleeding, pelvic pain, recurring ovarian cysts.  Pending pathology.  OPERATION:  Abdominal supracervical hysterectomy, destruction of left ovarian cysts.  ANESTHESIA:  General orotracheal.  OPERATOR:  Almedia Balls. Randell Patient, M.D.  FIRST ASSISTANT:  Harl Bowie, M.D.  INDICATIONS:  The patient is a 55 year old with the above-noted problems, who was counseled as to the need for surgery to treat these problems.  She has been fully counseled as to the nature of the procedure and the risks involved to include risk of anesthesia, injury to bowel or bladder, blood vessels, ureters, postoperative hemorrhage, infection, and recuperation and the use of hormone replacement should her ovaries be removed.  She fully understands all of these considerations and wishes to proceed on Nov 20, 2000.  OPERATIVE FINDINGS:  On an attempt to effect descent of the uterus, it was found that the uterus would not descend above the upper vaginal area. Accordingly, an abdominal procedure was performed.  On entry into the abdomen, the uterus was mid posterior, top-normal size and extremely well supported by uterosacral ligaments, such that descent was not possible.  Upper abdominal viscera were palpated and felt to be normal to include liver, gallbladder area, spleen, kidneys, periaortic areas, and appendix.  DESCRIPTION OF PROCEDURE:  With the patient under general anesthesia, prepared and draped in the usual sterile fashion, a speculum was placed in the vagina, and a tenaculum was placed on the cervix.  An attempt was made to  effect descent of the uterus which was unsuccessful.  Accordingly, an abdominal procedure was performed.  The patient was reprepared, positioned, and draped for an abdominal procedure after a Foley catheter had been placed and left to straight drainage, and lower abdominal transverse incision was made and carried into the peritoneal cavity without difficulty.  Self-retaining retractor was placed, and the bowel was packed off.  Kelly clamps were used to clamp the uteroovarian anastomoses, tubes, and round ligaments bilaterally for traction and hemostasis.  The round ligaments were then secured by suture ligature or Bovie electrocoagulation with opening of the anterior peritoneum and retroperitoneal space.  Bladder flap was developed on the anterior surface of the uterus and was advanced over the lower uterine segment and cervix.  The uteroovarian anastomoses bilaterally were clamped using Heaney clamps, cut, and doubly ligated with #1 chromic catgut so that the ovaries could be conserved.  Small simple cysts were destroyed using Bovie electrocoagulation.  Skeletonization of the vessels was then carried out with clamping bilaterally, cutting, and suturing with #1 chromic catgut.  Cardinal ligaments were treated in a similar fashion.  At this point, it was possible to remove the uterine fundus from the cervix which was accomplished using Bovie electrocoagulation.  Interrupted sutures of #1 chromic catgut were placed at the 3 and 9 oclock positions on the cervix for hemostasis and in the midline for reapproximation of the upper cervix and hemostasis.  Small bleeders were rendered hemostatic with Bovie electrocoagulation.  The area was lavaged with copious amounts of Lactated Ringers solution, and after noting that hemostasis was maintained in  this area, the area was reperitonealized with an interrupted suture of #1 chromic catgut.  After inspecting for hemostasis, and noting that this was  maintained, and after noting that sponge and instrument counts were correct, the peritoneum was closed with a continuous suture of 0 Vicryl.  The fascia was closed with two sutures of 0 Vicryl which were brought from the lateral aspects of the incision and tied separately in the midline.  Subcutaneous analgesia system was then placed using a puncture wound with placement of a catheter beneath the subcutaneous fat overlying the fascia.  The subcutaneous fat was reapproximated with interrupted sutures of #1 chromic catgut.  The skin was closed with a subcuticular suture of 3-0 plain catgut.  Estimated blood loss 250 mL.  The patient was taken to the recovery room in good condition with clear urine in the Foley catheter tubing and the subcutaneous analgesic catheter in the lower abdomen on the right.  She will be placed on 23-hour observation following surgery. DD:  11/20/00 TD:  11/20/00 Job: [id number]

## 2010-11-09 NOTE — Discharge Summary (Signed)
[facility]  Patient:    Julie Gray, Julie Gray                   MRN: [id number] Adm. Date:  [id number] Disc. Date: 11/21/00 Attending:  Collene Schlichter                           Discharge Summary  HISTORY:                      The patient is a 55 year old with abnormal uterine bleeding, pelvic pain, dysmenorrhea, dyspareunia, history of endometriosis who was admitted for hysterectomy on Nov 20, 2000. The remainder of her history and physical are as previously dictated.  LABORATORY DATA:              Preoperative hemoglobin of 13.4.  HOSPITAL COURSE:              The patient was taken to the operating room on Nov 20, 2000 at which time, after an attempted laparoscopically assisted vaginal hysterectomy, an abdominal supracervical hysterectomy was performed. The patient did well postoperatively. Diet and ambulation were progressed over the evening of May 30 and early morning of May 31. On the morning of May 31, she was afebrile and experiencing only discomfort which was relieved by analgesia, and it was felt that she could be discharged at this time.  FINAL DIAGNOSES:              1. Abnormal uterine bleeding.                               2. Pelvic pain.                               3. Dysmenorrhea.                               4. Dyspareunia.                               5. History of endometriosis.  OPERATION:                    Abdominal supracervical hysterectomy.  PATHOLOGY REPORT:             Unavailable at the time of dictation.  DISPOSITION:                  Discharged home to return to the office in two weeks for followup. She was instructed to gradually progress her activities over several weeks at home and to limit lifting or driving for two weeks. She was fully ambulatory, on a regular diet, and in good condition at the time of discharge.  DISCHARGE MEDICATIONS:        She was given a prescription for [employer], or  generic, #30, to be taken one or two q.4h. p.r.n. pain and doxycycline 100 mg, #12, to be taken one b.i.d.  DISCHARGE INSTRUCTIONS:       She will call for any problems. DD:  11/21/00 TD:  11/21/00 Job: [id number]

## 2010-11-09 NOTE — H&P (Signed)
[facility]  Patient:    Julie Gray, Julie Gray                     MRN: [id number] Adm. Date:  11/20/00 Attending:  Almedia Balls. Randell Patient, M.D.                         History and Physical  CHIEF COMPLAINT:  Heavy periods with severe pain and dyspareunia.  HISTORY:  The patient is a 55 year old gravida 1, para 1, who has had tubal ligation in the past.  She has been followed in our office over several years and has undergone several D&C procedures because of abnormal bleeding.  The most recent of these was in April 2002 with findings of benign endocervical mucosa and disordered proliferative of endometrium with simple hyperplasia without atypia.  Because of the worsening problems that she is experiencing, she is admitted at this time for hysterectomy, possible bilateral salpingo-oophorectomy.  She has been fully counseled as to the nature of the procedure and the risks involved to include risks of anesthesia, injury to bowel, bladder, blood vessels, ureters, postoperative hemorrhage, infection, recuperation and hormone replacement should her ovaries be removed.  She fully understands all these considerations and wishes to proceed on Nov 20, 2000. Pap smear was normal in November 2001.  PAST MEDICAL HISTORY:  Includes D&C as noted above and a previous D&C in September 2000, again with benign findings.  She has undergone tubal ligation for sterilization in the past.  She has been placed on oral contraceptives in the past to attempt to control the bleeding without success.  She takes Celebrex 200 mg one daily because of arthritis of her cervical spine, plus other vitamins.  She has been experiencing hot flashes recently as well with indication of decreased estrogen effect.  FAMILY HISTORY:  Noncontributory.  REVIEW OF SYSTEMS:  HEENT:  Negative.  Cardiorespiratory:  Negative. Gastrointestinal:  Negative.  Genitourinary:  As in present illness. Neuromuscular:   Negative.  PHYSICAL EXAMINATION:  VITAL SIGNS:  Height is 5 feet 4.5 inches, weight is 151 pounds.  Blood pressure is 110/60, pulse 80, respirations 18.  GENERAL:  Well-developed white female in no acute distress.  HEENT:  Within normal limits.  NECK:  Supple without masses, adenopathy or bruits.  HEART:  Regular rate and rhythm without murmurs.  LUNGS:  Clear to P & A.  BREASTS:  Sitting and lying without mass.  Axilla negative.  ABDOMEN:  Flat and soft with some tenderness bilaterally, right greater than left.  PELVIC:  External genitalia, Bartholins, urethral and Skenes glands within normal limits.  Cervix slightly inflamed, uterus in position, approximately 10-[redacted] weeks gestation size.  There were no palpable adnexal masses.  Anterior and posterior cul-de-sac exam was confirmatory.  EXTREMITIES:  Within normal limits.  CENTRAL NERVOUS SYSTEM:  Grossly intact.  SKIN:  Without suspicious lesions.  IMPRESSION:  Abnormal uterine bleeding, pelvic pain, dysmenorrhea, dyspareunia.  DISCHARGE DISPOSITION:  As noted above. DD:  11/18/00 TD:  11/19/00 Job: [id number]

## 2010-12-14 ENCOUNTER — Encounter: Payer: Self-pay | Admitting: Family Medicine

## 2010-12-14 ENCOUNTER — Ambulatory Visit (INDEPENDENT_AMBULATORY_CARE_PROVIDER_SITE_OTHER): Payer: 59 | Admitting: Family Medicine

## 2010-12-14 ENCOUNTER — Ambulatory Visit: Payer: 59 | Admitting: Internal Medicine

## 2010-12-14 DIAGNOSIS — I10 Essential (primary) hypertension: Secondary | ICD-10-CM

## 2010-12-16 NOTE — Progress Notes (Signed)
OFFICE NOTE  12/16/2010  CC:  Chief Complaint  Patient presents with  . Hypertension    c/o elevated blood pressure     HPI:   Patient is a 55 y.o. Caucasian female who is here for htn f/u. BP at home still 140s/90s some, sometimes 150s systolic.  Some headaches the last week or so.   Ran out of meds yesterday so current bp is w/out any med in her system. Pertinent PMH:  HTN Hyperlipidemia Osteoarthritis GERD Allergic rhinitis Menopausal syndrome  MEDS;   Outpatient Prescriptions Prior to Visit  Medication Sig Dispense Refill  . fexofenadine (ALLEGRA) 180 MG tablet Take 180 mg by mouth at bedtime.        . gabapentin (NEURONTIN) 300 MG capsule Take 300 mg by mouth 2 (two) times daily.        . Glucosamine-Chondroitin-Vit D3 1500-1200-800 MG-MG-UNIT PACK Take 1 tablet by mouth 2 (two) times daily.        Marland Kitchen losartan (COZAAR) 50 MG tablet Take 1 tablet (50 mg total) by mouth daily.  30 tablet  3  . mometasone (NASONEX) 50 MCG/ACT nasal spray 2 sprays by Each Nare route daily.        Marland Kitchen omeprazole (PRILOSEC) 40 MG capsule Take 1 capsule (40 mg total) by mouth daily.  90 capsule  1  . Soy Isoflavones 40 MG TABS Take 1 tablet by mouth 2 (two) times daily.        Marland Kitchen venlafaxine (EFFEXOR-XR) 75 MG 24 hr capsule Take 75 mg by mouth daily.          PE: Blood pressure 126/70, pulse 84, temperature 98.3 F (36.8 C), temperature source Oral, resp. rate 20, SpO2 98.00%. Gen: Alert, well appearing.  Patient is oriented to person, place, time, and situation. Neck: supple, ROM full.  Carotids 2+ bilat, without bruit.  No lymphadenopathy, thyromegaly, or mass. Chest: symmetric expansion, nonlabored respirations.  Clear and equal breath sounds in all lung fields.   CV: RRR, no m/r/g.  Peripheral pulses 2+ and symmetric.  P 65-70 by me today. EXT: no clubbing, cyanosis, or edema.    IMPRESSION AND PLAN:  HYPERTENSION Control not ideal. Will change to benicar HCT 40/12.5, 1  qd. Therapeutic expectations and side effect profile of medication discussed today.  Patient's questions answered. Monitor home bp qd-qod.  DASH diet.  Exercise 30 min for 5/7 days per week. F/u 73mo in office, bmet the week prior.     FOLLOW UP:  Return in about 4 weeks (around 01/11/2011) for f/u HTN.

## 2010-12-16 NOTE — Assessment & Plan Note (Signed)
Control not ideal. Will change to benicar HCT 40/12.5, 1 qd. Therapeutic expectations and side effect profile of medication discussed today.  Patient's questions answered. Monitor home bp qd-qod.  DASH diet.  Exercise 30 min for 5/7 days per week. F/u 49mo in office, bmet the week prior.

## 2010-12-24 ENCOUNTER — Other Ambulatory Visit (INDEPENDENT_AMBULATORY_CARE_PROVIDER_SITE_OTHER): Payer: 59

## 2010-12-24 DIAGNOSIS — I10 Essential (primary) hypertension: Secondary | ICD-10-CM

## 2010-12-24 LAB — BASIC METABOLIC PANEL
CO2: 32 mEq/L (ref 19–32)
Glucose, Bld: 102 mg/dL — ABNORMAL HIGH (ref 70–99)
Potassium: 3.8 mEq/L (ref 3.5–5.1)
Sodium: 142 mEq/L (ref 135–145)

## 2011-01-11 ENCOUNTER — Encounter: Payer: Self-pay | Admitting: Internal Medicine

## 2011-01-11 ENCOUNTER — Ambulatory Visit (INDEPENDENT_AMBULATORY_CARE_PROVIDER_SITE_OTHER): Payer: 59 | Admitting: Internal Medicine

## 2011-01-11 ENCOUNTER — Ambulatory Visit: Payer: 59 | Admitting: Internal Medicine

## 2011-01-11 VITALS — BP 138/80 | HR 75 | Temp 98.5°F | Wt 196.0 lb

## 2011-01-11 DIAGNOSIS — I10 Essential (primary) hypertension: Secondary | ICD-10-CM

## 2011-01-11 MED ORDER — OLMESARTAN MEDOXOMIL-HCTZ 40-12.5 MG PO TABS: 1.0000 | ORAL_TABLET | Freq: Every day | ORAL | Status: DC

## 2011-01-11 NOTE — Assessment & Plan Note (Signed)
Normotensive and stable. Continue current dosing. Prescription provided.

## 2011-01-11 NOTE — Progress Notes (Signed)
  Subjective:    [name], female    DOB: [birth year], 55 y.o.   MRN: [id number]  HPI Pt presents to clinic for followup of HTN. Recently changed from cozaar to samples of benicar hct. Tolerates well without cough. Notices mild increase in urination. Mild le intermittent swelling now resolved. Reviewed recent nl chem7. Outpt bp log reviewed as normotensive. No complaint.  Reviewed pmh, medications and allergies   Review of Systems see hpi     Objective:   Physical Exam  Nursing note and vitals reviewed. Constitutional: She appears well-developed and well-nourished. No distress.  HENT:  Head: Normocephalic and atraumatic.  Right Ear: External ear normal.  Left Ear: External ear normal.  Eyes: Conjunctivae are normal. No scleral icterus.  Cardiovascular: Normal rate and regular rhythm.   Pulmonary/Chest: Effort normal and breath sounds normal. No respiratory distress. She has no wheezes. She has no rales.  Neurological: She is alert.  Skin: Skin is warm and dry. She is not diaphoretic.  Psychiatric: She has a normal mood and affect.          Assessment & Plan:

## 2011-01-30 ENCOUNTER — Telehealth: Payer: Self-pay | Admitting: *Deleted

## 2011-01-30 NOTE — Telephone Encounter (Signed)
Patient had a colonoscopy on 12/13/04 due to family history of colon cancer in a parent and personal history of adenomatous colon polyps. I have left a message for patient to call back.

## 2011-02-06 ENCOUNTER — Encounter: Payer: Self-pay | Admitting: Internal Medicine

## 2011-02-06 NOTE — Telephone Encounter (Signed)
-----   Message -----    From: Holli Humbles    Sent: 02/06/2011   4:36 PM      To: Vernia Buff, CMA  Pt. returned your call and sch'd COL w/Dr. Juanda Chance for 03-22-11 and Previsit for 03-08-11 @ 8:00. Mailed Paperwork.

## 2011-02-06 NOTE — Telephone Encounter (Signed)
Left message for patient to call back  

## 2011-03-11 ENCOUNTER — Ambulatory Visit (AMBULATORY_SURGERY_CENTER): Payer: 59 | Admitting: *Deleted

## 2011-03-11 VITALS — Ht 64.0 in | Wt 198.0 lb

## 2011-03-11 DIAGNOSIS — Z1211 Encounter for screening for malignant neoplasm of colon: Secondary | ICD-10-CM

## 2011-03-11 MED ORDER — SUPREP BOWEL PREP KIT 17.5-3.13-1.6 GM/177ML PO SOLN: 1.0000 | ORAL | Status: DC

## 2011-03-12 ENCOUNTER — Encounter: Payer: Self-pay | Admitting: Internal Medicine

## 2011-03-22 ENCOUNTER — Encounter: Payer: Self-pay | Admitting: Internal Medicine

## 2011-03-22 ENCOUNTER — Ambulatory Visit (AMBULATORY_SURGERY_CENTER): Payer: 59 | Admitting: Internal Medicine

## 2011-03-22 ENCOUNTER — Other Ambulatory Visit: Payer: 59 | Admitting: Internal Medicine

## 2011-03-22 VITALS — BP 119/80 | HR 64 | Temp 97.9°F | Resp 18 | Ht 64.0 in | Wt 198.0 lb

## 2011-03-22 DIAGNOSIS — D126 Benign neoplasm of colon, unspecified: Secondary | ICD-10-CM

## 2011-03-22 DIAGNOSIS — K2270 Barrett's esophagus without dysplasia: Secondary | ICD-10-CM

## 2011-03-22 DIAGNOSIS — Z1211 Encounter for screening for malignant neoplasm of colon: Secondary | ICD-10-CM

## 2011-03-22 MED ORDER — SODIUM CHLORIDE 0.9 % IV SOLN: 500.0000 mL | INTRAVENOUS | Status: DC

## 2011-03-22 NOTE — Patient Instructions (Signed)
Follow your discharge instructions.  Continue your medications.  High Fiber diet with liberal fluid intake.  Await pathology results.

## 2011-03-25 ENCOUNTER — Telehealth: Payer: Self-pay

## 2011-03-25 NOTE — Telephone Encounter (Signed)

## 2011-03-26 ENCOUNTER — Encounter: Payer: Self-pay | Admitting: Internal Medicine

## 2011-04-09 ENCOUNTER — Encounter: Payer: Self-pay | Admitting: Internal Medicine

## 2011-04-09 ENCOUNTER — Ambulatory Visit (INDEPENDENT_AMBULATORY_CARE_PROVIDER_SITE_OTHER): Payer: 59 | Admitting: Internal Medicine

## 2011-04-09 VITALS — BP 112/82 | HR 77 | Temp 98.6°F | Resp 18 | Wt 198.0 lb

## 2011-04-09 DIAGNOSIS — J329 Chronic sinusitis, unspecified: Secondary | ICD-10-CM

## 2011-04-09 MED ORDER — DOXYCYCLINE HYCLATE 100 MG PO TABS: 100.0000 mg | ORAL_TABLET | Freq: Two times a day (BID) | ORAL | Status: AC

## 2011-04-09 MED ORDER — METHYLPREDNISOLONE ACETATE 40 MG/ML IJ SUSP
40.0000 mg | Freq: Once | INTRAMUSCULAR | Status: AC
  Administered 2011-04-09: 40 mg via INTRAMUSCULAR

## 2011-04-09 NOTE — Progress Notes (Signed)
  Subjective:    [name], female    DOB: Oct 27, 1955, 55 y.o.   MRN: [id number]  HPI Pt presents to clinic for evaluation of possible sinusitis. Notes almost a week of cough productive for yellow sputum with nasal congestion and sinus pressure. Taking otc allergy medication without improvement. No other alleviating or exacerbating factors. No sick exposures or f/c. No other complaints.  Past Medical History  Diagnosis Date  . Osteoarthritis     knees  . Hypoglycemia   . Visual disturbance     visual flashes, referred to [facility] (Normal funduscopic exam)  . Allergic rhinitis   . Snoring 2008    negative sleep study of OSA  . Colon polyps   . Hot flashes   . Facial lesion 06/2010    removed from right side of face--precancerous  . GERD (gastroesophageal reflux disease)   . Hypertension   . Adenomatous colon polyp    Past Surgical History  Procedure Date  . Abdominal hysterectomy 2002  . Cervical fusion 06/15/09    right sided C6-C7 radiculopathy status post anterior cervical diskectomy and fusion at the C5-6 and C6-7 levels. Estill Bamberg, MD  . Carpal tunnel release 05/2010, 06/2010    bilateral--Dr Ave Filter  . Colonoscopy     reports that she has never smoked. She has never used smokeless tobacco. She reports that she does not drink alcohol or use illicit drugs. family history includes Colon cancer in her mother and Stroke in her mother. Allergies  Allergen Reactions  . Estradiol     REACTION: Rash       Review of Systems see hpi     Objective:   Physical Exam  Nursing note and vitals reviewed. Constitutional: She appears well-developed and well-nourished. No distress.  HENT:  Head: Normocephalic and atraumatic.  Right Ear: Tympanic membrane, external ear and ear canal normal.  Left Ear: Tympanic membrane, external ear and ear canal normal.  Nose: Nose normal.  Mouth/Throat: Oropharynx is clear and moist. No oropharyngeal exudate.    Neck: Neck supple.  Cardiovascular: Normal rate, regular rhythm and normal heart sounds.   Pulmonary/Chest: Effort normal and breath sounds normal. No respiratory distress. She has no wheezes. She has no rales.  Lymphadenopathy:    She has no cervical adenopathy.  Neurological: She is alert.  Skin: Skin is warm and dry. She is not diaphoretic.  Psychiatric: She has a normal mood and affect.          Assessment & Plan:

## 2011-04-10 ENCOUNTER — Telehealth: Payer: Self-pay | Admitting: *Deleted

## 2011-04-10 NOTE — Telephone Encounter (Signed)
Patient called and left voice message stating she checked her medications when she got home, and the current dosing of her Omeprazole is 40 and  20 mg and previously stated at office visit.

## 2011-04-10 NOTE — Telephone Encounter (Signed)
Rx updated to reflect current dosing.   Patient called back and left voice message stating she wanted to make Dr Rodena Medin aware that she is feeling much better today. Her message stated that she was able to sleep through the night, drainage and cough has improved.

## 2011-04-13 DIAGNOSIS — J329 Chronic sinusitis, unspecified: Secondary | ICD-10-CM | POA: Insufficient documentation

## 2011-04-13 NOTE — Assessment & Plan Note (Signed)
Begin doxycycline to completion. depomedrol im injxn given. Followup if no improvement or worsening.

## 2011-05-01 ENCOUNTER — Other Ambulatory Visit: Payer: Self-pay | Admitting: Internal Medicine

## 2011-05-02 NOTE — Telephone Encounter (Signed)
Rx refill sent to pharmacy. 

## 2011-05-14 ENCOUNTER — Ambulatory Visit (INDEPENDENT_AMBULATORY_CARE_PROVIDER_SITE_OTHER): Payer: 59 | Admitting: Internal Medicine

## 2011-05-14 ENCOUNTER — Encounter: Payer: Self-pay | Admitting: Internal Medicine

## 2011-05-14 VITALS — BP 100/80 | HR 73 | Temp 97.3°F | Resp 16

## 2011-05-14 DIAGNOSIS — E785 Hyperlipidemia, unspecified: Secondary | ICD-10-CM

## 2011-05-14 DIAGNOSIS — E782 Mixed hyperlipidemia: Secondary | ICD-10-CM | POA: Insufficient documentation

## 2011-05-14 DIAGNOSIS — Z79899 Other long term (current) drug therapy: Secondary | ICD-10-CM

## 2011-05-14 DIAGNOSIS — IMO0001 Reserved for inherently not codable concepts without codable children: Secondary | ICD-10-CM

## 2011-05-14 DIAGNOSIS — I10 Essential (primary) hypertension: Secondary | ICD-10-CM

## 2011-05-14 DIAGNOSIS — Z23 Encounter for immunization: Secondary | ICD-10-CM

## 2011-05-14 LAB — CBC
HCT: 43.5 % (ref 36.0–46.0)
Hemoglobin: 14.8 g/dL (ref 12.0–15.0)
MCH: 30.5 pg (ref 26.0–34.0)
MCHC: 34 g/dL (ref 30.0–36.0)

## 2011-05-14 LAB — BASIC METABOLIC PANEL
BUN: 21 mg/dL (ref 6–23)
Glucose, Bld: 82 mg/dL (ref 70–99)
Potassium: 4.6 mEq/L (ref 3.5–5.3)

## 2011-05-14 LAB — HEPATIC FUNCTION PANEL
ALT: 14 U/L (ref 0–35)
Bilirubin, Direct: 0.1 mg/dL (ref 0.0–0.3)
Indirect Bilirubin: 0.3 mg/dL (ref 0.0–0.9)
Total Bilirubin: 0.4 mg/dL (ref 0.3–1.2)

## 2011-05-14 LAB — LIPID PANEL
Cholesterol: 222 mg/dL — ABNORMAL HIGH (ref 0–200)
VLDL: 28 mg/dL (ref 0–40)

## 2011-05-14 MED ORDER — OMEPRAZOLE 20 MG PO CPDR: 40.0000 mg | DELAYED_RELEASE_CAPSULE | Freq: Every day | ORAL | Status: DC

## 2011-05-14 MED ORDER — VENLAFAXINE HCL ER 75 MG PO CP24: 75.0000 mg | ORAL_CAPSULE | Freq: Every day | ORAL | Status: DC

## 2011-05-14 MED ORDER — GABAPENTIN 300 MG PO CAPS: 300.0000 mg | ORAL_CAPSULE | Freq: Two times a day (BID) | ORAL | Status: DC

## 2011-05-14 NOTE — Assessment & Plan Note (Signed)
Obtain lipid/lft. 

## 2011-05-14 NOTE — Assessment & Plan Note (Signed)
Stable. Refill neurontin

## 2011-05-14 NOTE — Progress Notes (Signed)
  Subjective:    [name], female    DOB: 1956-01-18, 55 y.o.   MRN: [id number]  HPI Pt presents to clinic for followup of multiple medical problems. BP normotensive on home monitoring. bp rechecked today 110/70. Compliant with medication without adverse effect. Reviewed h/o hyperlipidemia not currently requiring medication. No active complaint.  Past Medical History  Diagnosis Date  . Osteoarthritis     knees  . Hypoglycemia   . Visual disturbance     visual flashes, referred to [facility] (Normal funduscopic exam)  . Allergic rhinitis   . Snoring 2008    negative sleep study of OSA  . Colon polyps   . Hot flashes   . Facial lesion 06/2010    removed from right side of face--precancerous  . GERD (gastroesophageal reflux disease)   . Hypertension   . Adenomatous colon polyp    Past Surgical History  Procedure Date  . Abdominal hysterectomy 2002  . Cervical fusion 06/15/09    right sided C6-C7 radiculopathy status post anterior cervical diskectomy and fusion at the C5-6 and C6-7 levels. Estill Bamberg, MD  . Carpal tunnel release 05/2010, 06/2010    bilateral--Dr Ave Filter  . Colonoscopy     reports that she has never smoked. She has never used smokeless tobacco. She reports that she does not drink alcohol or use illicit drugs. family history includes Colon cancer in her mother and Stroke in her mother. Allergies  Allergen Reactions  . Estradiol     REACTION: Rash     Review of Systems see hpi     Objective:   Physical Exam  Physical Exam  Nursing note and vitals reviewed. Constitutional: Appears well-developed and well-nourished. No distress.  HENT:  Head: Normocephalic and atraumatic.  Right Ear: External ear normal.  Left Ear: External ear normal.  Eyes: Conjunctivae are normal. No scleral icterus.  Neck: Neck supple. Carotid bruit is not present.  Cardiovascular: Normal rate, regular rhythm and normal heart sounds.  Exam reveals no  gallop and no friction rub.   No murmur heard. Pulmonary/Chest: Effort normal and breath sounds normal. No respiratory distress. He has no wheezes. no rales.  Lymphadenopathy:    He has no cervical adenopathy.  Neurological:Alert.  Skin: Skin is warm and dry. Not diaphoretic.  Psychiatric: Has a normal mood and affect.        Assessment & Plan:

## 2011-05-14 NOTE — Assessment & Plan Note (Signed)
Normotensive and stable. Continue current regimen. Monitor bp as outpt and followup in clinic as scheduled. Obtain cbc and chem7 

## 2011-08-16 ENCOUNTER — Encounter: Payer: Self-pay | Admitting: Internal Medicine

## 2011-08-16 ENCOUNTER — Ambulatory Visit (INDEPENDENT_AMBULATORY_CARE_PROVIDER_SITE_OTHER): Payer: 59 | Admitting: Internal Medicine

## 2011-08-16 VITALS — BP 110/72 | HR 85 | Temp 98.5°F | Resp 20 | Ht 64.0 in | Wt 200.0 lb

## 2011-08-16 DIAGNOSIS — J329 Chronic sinusitis, unspecified: Secondary | ICD-10-CM

## 2011-08-16 DIAGNOSIS — R238 Other skin changes: Secondary | ICD-10-CM

## 2011-08-16 LAB — CBC WITH DIFFERENTIAL/PLATELET
Basophils Absolute: 0 10*3/uL (ref 0.0–0.1)
HCT: 40.6 % (ref 36.0–46.0)
Hemoglobin: 13.7 g/dL (ref 12.0–15.0)
Lymphocytes Relative: 23 % (ref 12–46)
Lymphs Abs: 1.5 10*3/uL (ref 0.7–4.0)
Monocytes Absolute: 0.6 10*3/uL (ref 0.1–1.0)
Neutro Abs: 4.1 10*3/uL (ref 1.7–7.7)
RBC: 4.55 MIL/uL (ref 3.87–5.11)
RDW: 12.4 % (ref 11.5–15.5)
WBC: 6.3 10*3/uL (ref 4.0–10.5)

## 2011-08-16 MED ORDER — AZITHROMYCIN 250 MG PO TABS: ORAL_TABLET | ORAL | Status: AC

## 2011-08-18 DIAGNOSIS — R238 Other skin changes: Secondary | ICD-10-CM | POA: Insufficient documentation

## 2011-08-18 DIAGNOSIS — J329 Chronic sinusitis, unspecified: Secondary | ICD-10-CM | POA: Insufficient documentation

## 2011-08-18 NOTE — Assessment & Plan Note (Signed)
Obtain cbc  

## 2011-08-18 NOTE — Progress Notes (Signed)
  Subjective:    [name], female    DOB: [birth year], 56 y.o.   MRN: [id number]  HPI Pt presents to clinic for evaluation of possible sinusitis. Notes 2wk h/o sinus ha-frontal, green nasal drainage and productive cough without hemoptysis. Taking otc cold medication without improvement. No alleviating or exacerbating factors. Notes recent easy bruisability without gross active bleeding. No triggers/injury for bruising. Does take low dose asa. No h/o coagulopathy or thrombocytopenia.  Past Medical History  Diagnosis Date  . Osteoarthritis     knees  . Hypoglycemia   . Visual disturbance     visual flashes, referred to [facility] (Normal funduscopic exam)  . Allergic rhinitis   . Snoring 2008    negative sleep study of OSA  . Colon polyps   . Hot flashes   . Facial lesion 06/2010    removed from right side of face--precancerous  . GERD (gastroesophageal reflux disease)   . Hypertension   . Adenomatous colon polyp    Past Surgical History  Procedure Date  . Abdominal hysterectomy 2002  . Cervical fusion 06/15/09    right sided C6-C7 radiculopathy status post anterior cervical diskectomy and fusion at the C5-6 and C6-7 levels. Estill Bamberg, MD  . Carpal tunnel release 05/2010, 06/2010    bilateral--Dr Ave Filter  . Colonoscopy     reports that she has never smoked. She has never used smokeless tobacco. She reports that she does not drink alcohol or use illicit drugs. family history includes Colon cancer in her mother and Stroke in her mother. Allergies  Allergen Reactions  . Estradiol     REACTION: Rash     Review of Systems see hpi     Objective:   Physical Exam  Nursing note and vitals reviewed. Constitutional: She appears well-developed and well-nourished. No distress.  HENT:  Head: Normocephalic and atraumatic.  Right Ear: External ear normal.  Left Ear: External ear normal.  Mouth/Throat: Oropharynx is clear and moist. No oropharyngeal  exudate.  Eyes: Conjunctivae are normal. No scleral icterus.  Pulmonary/Chest: Effort normal and breath sounds normal. No respiratory distress. She has no wheezes. She has no rales.  Neurological: She is alert.  Skin: Skin is warm and dry. She is not diaphoretic.  Psychiatric: She has a normal mood and affect.          Assessment & Plan:

## 2011-08-18 NOTE — Assessment & Plan Note (Signed)
Begin abx. Followup if no improvement or worsening.  

## 2011-09-03 ENCOUNTER — Telehealth: Payer: Self-pay | Admitting: *Deleted

## 2011-09-03 NOTE — Telephone Encounter (Signed)
kegel exercises always a good way to address. Can print handout if needed. For medication can try enablex 7.5mg  po qd #30 rf6 if no interactions.

## 2011-09-03 NOTE — Telephone Encounter (Signed)
Patient called and left voice message stating she is experiencing urinary leakage with sneezing coughing, and laughing. She would like to know if there is something she could take to help prevent this. She states that she has tried something a very long time ago, and she is unable to recall what it was that she took.

## 2011-09-04 MED ORDER — DARIFENACIN HYDROBROMIDE ER 7.5 MG PO TB24: 7.5000 mg | ORAL_TABLET | Freq: Every day | ORAL | Status: AC

## 2011-09-04 NOTE — Telephone Encounter (Signed)
Call placed to patient at [phone], no answer. A detailed voice message was left informing patient per Dr Rodena Medin instructions. She was advised to call back if there are any allergies with Enablex. Kegel exercise sheet mailed to patients address on file.

## 2011-09-04 NOTE — Telephone Encounter (Signed)
Patient returned phone call and left voice message stating she did not have any allergies to prescribed medication. Rx sent to pharmacy.

## 2011-09-16 ENCOUNTER — Ambulatory Visit: Payer: 59 | Admitting: Internal Medicine

## 2011-10-14 ENCOUNTER — Telehealth: Payer: Self-pay | Admitting: Internal Medicine

## 2011-10-14 NOTE — Telephone Encounter (Signed)
Patient states that she now uses [facility] for her pharmacy

## 2011-10-14 NOTE — Telephone Encounter (Signed)
Pharmacy updated.

## 2011-10-22 ENCOUNTER — Telehealth: Payer: Self-pay | Admitting: Internal Medicine

## 2011-10-22 MED ORDER — OLMESARTAN MEDOXOMIL-HCTZ 40-12.5 MG PO TABS: 1.0000 | ORAL_TABLET | Freq: Every day | ORAL | Status: DC

## 2011-10-22 MED ORDER — OMEPRAZOLE 20 MG PO CPDR: 40.0000 mg | DELAYED_RELEASE_CAPSULE | Freq: Every day | ORAL | Status: DC

## 2011-10-22 NOTE — Telephone Encounter (Signed)
Omeprazole cap  benicar hct tab  90 days supply

## 2011-10-22 NOTE — Telephone Encounter (Signed)
Rx refill sent to mail order pharmacy. 

## 2011-11-25 ENCOUNTER — Ambulatory Visit (INDEPENDENT_AMBULATORY_CARE_PROVIDER_SITE_OTHER): Payer: 59 | Admitting: Internal Medicine

## 2011-11-25 ENCOUNTER — Encounter: Payer: Self-pay | Admitting: Internal Medicine

## 2011-11-25 VITALS — BP 110/80 | HR 71 | Temp 98.0°F | Resp 16 | Wt 201.2 lb

## 2011-11-25 DIAGNOSIS — N951 Menopausal and female climacteric states: Secondary | ICD-10-CM

## 2011-11-25 DIAGNOSIS — E785 Hyperlipidemia, unspecified: Secondary | ICD-10-CM

## 2011-11-25 MED ORDER — ESTRADIOL 0.025 MG/24HR TD PTWK: 1.0000 | MEDICATED_PATCH | TRANSDERMAL | Status: DC

## 2011-11-25 NOTE — Patient Instructions (Signed)
Please schedule fasting labs for approximately June 17th chem7-v58.69, lipid/lft-272.4

## 2011-12-01 NOTE — Assessment & Plan Note (Signed)
Obtain lipid/lft. 

## 2011-12-01 NOTE — Assessment & Plan Note (Signed)
Discussed potential risks of HRT including but not limited to increased risk of VTE, MI and breast cancer. States understanding and wishes to resume estradiol. Attempt low dose climara.

## 2011-12-01 NOTE — Progress Notes (Signed)
  Subjective:    [name], female    DOB: 1955/12/18, 56 y.o.   MRN: [id number]  HPI Pt presents to clinic for followup of multiple medical problems. Notes continued hot flashes from menopause. Sx's are severe. Wishes to resume HRT.  H/o urine leakage/stress incontinence-enablex didn't help. kegel exercises helped some.   Past Medical History  Diagnosis Date  . Osteoarthritis     knees  . Hypoglycemia   . Visual disturbance     visual flashes, referred to [facility] (Normal funduscopic exam)  . Allergic rhinitis   . Snoring 2008    negative sleep study of OSA  . Colon polyps   . Hot flashes   . Facial lesion 06/2010    removed from right side of face--precancerous  . GERD (gastroesophageal reflux disease)   . Hypertension   . Adenomatous colon polyp    Past Surgical History  Procedure Date  . Abdominal hysterectomy 2002  . Cervical fusion 06/15/09    right sided C6-C7 radiculopathy status post anterior cervical diskectomy and fusion at the C5-6 and C6-7 levels. Estill Bamberg, MD  . Carpal tunnel release 05/2010, 06/2010    bilateral--Dr Ave Filter  . Colonoscopy     reports that she has never smoked. She has never used smokeless tobacco. She reports that she does not drink alcohol or use illicit drugs. family history includes Colon cancer in her mother and Stroke in her mother. No Active Allergies    Review of Systems see hpi     Objective:   Physical Exam  Nursing note and vitals reviewed. Constitutional: She appears well-developed and well-nourished.          Assessment & Plan:

## 2011-12-09 ENCOUNTER — Telehealth: Payer: Self-pay | Admitting: *Deleted

## 2011-12-09 DIAGNOSIS — E785 Hyperlipidemia, unspecified: Secondary | ICD-10-CM

## 2011-12-09 DIAGNOSIS — Z79899 Other long term (current) drug therapy: Secondary | ICD-10-CM

## 2011-12-09 LAB — BASIC METABOLIC PANEL
BUN: 14 mg/dL (ref 6–23)
CO2: 32 mEq/L (ref 19–32)
Chloride: 102 mEq/L (ref 96–112)
Potassium: 4.1 mEq/L (ref 3.5–5.3)

## 2011-12-09 LAB — HEPATIC FUNCTION PANEL
Albumin: 4.2 g/dL (ref 3.5–5.2)
Indirect Bilirubin: 0.3 mg/dL (ref 0.0–0.9)
Total Bilirubin: 0.4 mg/dL (ref 0.3–1.2)
Total Protein: 6.3 g/dL (ref 6.0–8.3)

## 2011-12-09 LAB — LIPID PANEL
HDL: 44 mg/dL (ref >39)
LDL Cholesterol: 129 mg/dL — ABNORMAL HIGH (ref 0–99)
Triglycerides: 142 mg/dL (ref <150)
VLDL: 28 mg/dL (ref 0–40)

## 2011-12-09 NOTE — Telephone Encounter (Signed)
Pt presented to the lab. Order placed and given to the lab.

## 2011-12-30 ENCOUNTER — Encounter: Payer: Self-pay | Admitting: Internal Medicine

## 2011-12-30 ENCOUNTER — Ambulatory Visit (INDEPENDENT_AMBULATORY_CARE_PROVIDER_SITE_OTHER): Payer: 59 | Admitting: Internal Medicine

## 2011-12-30 VITALS — BP 110/62 | HR 79 | Temp 98.6°F | Wt 201.0 lb

## 2011-12-30 DIAGNOSIS — L729 Follicular cyst of the skin and subcutaneous tissue, unspecified: Secondary | ICD-10-CM

## 2011-12-30 DIAGNOSIS — L723 Sebaceous cyst: Secondary | ICD-10-CM

## 2011-12-30 NOTE — Progress Notes (Signed)
  Subjective:    [name], female    DOB: 01/10/1956, 56 y.o.   MRN: [id number]  HPI Pt presents to clinic for evaluation of possible soft tissue mass. Noticed very small soft tissue mass of left forearm three days ago. Area is nontender and mobile. No obvious injury/trauma or other trigger. No alleviating or exacerbating factors. Is using otc green tea extract for weight loss and bp monitoring has been normal.   Past Medical History  Diagnosis Date  . Osteoarthritis     knees  . Hypoglycemia   . Visual disturbance     visual flashes, referred to [facility] (Normal funduscopic exam)  . Allergic rhinitis   . Snoring 2008    negative sleep study of OSA  . Colon polyps   . Hot flashes   . Facial lesion 06/2010    removed from right side of face--precancerous  . GERD (gastroesophageal reflux disease)   . Hypertension   . Adenomatous colon polyp    Past Surgical History  Procedure Date  . Abdominal hysterectomy 2002  . Cervical fusion 06/15/09    right sided C6-C7 radiculopathy status post anterior cervical diskectomy and fusion at the C5-6 and C6-7 levels. Estill Bamberg, MD  . Carpal tunnel release 05/2010, 06/2010    bilateral--Dr Ave Filter  . Colonoscopy     reports that she has never smoked. She has never used smokeless tobacco. She reports that she does not drink alcohol or use illicit drugs. family history includes Colon cancer in her mother and Stroke in her mother. No Active Allergies   Review of Systems see hpi     Objective:   Physical Exam  Nursing note and vitals reviewed. Constitutional: She appears well-developed and well-nourished. No distress.  HENT:  Head: Normocephalic and atraumatic.  Right Ear: External ear normal.  Left Ear: External ear normal.  Neurological: She is alert.  Skin: Skin is warm and dry. No rash noted. She is not diaphoretic. No erythema.       Left forearm: 3-56mm ST mass. Well circumscribed, NT and mobile.    Psychiatric: She has a normal mood and affect.          Assessment & Plan:

## 2011-12-30 NOTE — Assessment & Plan Note (Signed)
Appears most c/w cyst. Is asx. Recommend observation currently.

## 2012-03-26 ENCOUNTER — Ambulatory Visit: Payer: 59 | Admitting: Internal Medicine

## 2012-03-26 ENCOUNTER — Encounter: Payer: Self-pay | Admitting: Internal Medicine

## 2012-03-26 ENCOUNTER — Ambulatory Visit (INDEPENDENT_AMBULATORY_CARE_PROVIDER_SITE_OTHER): Payer: 59 | Admitting: Internal Medicine

## 2012-03-26 VITALS — BP 104/78 | HR 75 | Temp 97.8°F | Resp 16 | Wt 197.8 lb

## 2012-03-26 DIAGNOSIS — Z23 Encounter for immunization: Secondary | ICD-10-CM

## 2012-03-26 DIAGNOSIS — E669 Obesity, unspecified: Secondary | ICD-10-CM

## 2012-03-26 DIAGNOSIS — E785 Hyperlipidemia, unspecified: Secondary | ICD-10-CM

## 2012-03-26 DIAGNOSIS — I10 Essential (primary) hypertension: Secondary | ICD-10-CM

## 2012-03-26 NOTE — Progress Notes (Signed)
  Subjective:    [name], female    DOB: 1955-11-20, 56 y.o.   MRN: [id number]  HPI Pt presents to clinic for followup of multiple medical problems. Is successfully attempting to lose weight. Down 4 lbs since last visit. Stopped green coffee extract. Noted recent bilateral axillary tenderness without mass-now almost resolved. Similar sx's used to occur prior to her periods. BP reviewed normotensive.   Past Medical History  Diagnosis Date  . Osteoarthritis     knees  . Hypoglycemia   . Visual disturbance     visual flashes, referred to [facility] (Normal funduscopic exam)  . Allergic rhinitis   . Snoring 2008    negative sleep study of OSA  . Colon polyps   . Hot flashes   . Facial lesion 06/2010    removed from right side of face--precancerous  . GERD (gastroesophageal reflux disease)   . Hypertension   . Adenomatous colon polyp    Past Surgical History  Procedure Date  . Abdominal hysterectomy 2002  . Cervical fusion 06/15/09    right sided C6-C7 radiculopathy status post anterior cervical diskectomy and fusion at the C5-6 and C6-7 levels. Estill Bamberg, MD  . Carpal tunnel release 05/2010, 06/2010    bilateral--Dr Ave Filter  . Colonoscopy     reports that she has never smoked. She has never used smokeless tobacco. She reports that she does not drink alcohol or use illicit drugs. family history includes Colon cancer in her mother and Stroke in her mother. No Active Allergies    Review of Systems see hpi     Objective:   Physical Exam  Physical Exam  Nursing note and vitals reviewed. Constitutional: Appears well-developed and well-nourished. No distress.  HENT:  Head: Normocephalic and atraumatic.  Right Ear: External ear normal.  Left Ear: External ear normal.  Eyes: Conjunctivae are normal. No scleral icterus.  Neck: Neck supple. Carotid bruit is not present.  Cardiovascular: Normal rate, regular rhythm and normal heart sounds.  Exam  reveals no gallop and no friction rub.   No murmur heard. Pulmonary/Chest: Effort normal and breath sounds normal. No respiratory distress. He has no wheezes. no rales.  Lymphadenopathy:    He has no cervical adenopathy.  Neurological:Alert.  Skin: Skin is warm and dry. Not diaphoretic.  Psychiatric: Has a normal mood and affect.        Assessment & Plan:

## 2012-03-26 NOTE — Assessment & Plan Note (Signed)
Congratulated on efforts. Continue dietary modification/exercise.

## 2012-03-26 NOTE — Assessment & Plan Note (Signed)
Normotensive and stable. Continue current regimen. Recommend outpt bp log as BP may decline with further weight loss. May require downward titration. Obtain chem7 prior to next visit

## 2012-03-26 NOTE — Assessment & Plan Note (Signed)
Encouraged further weight loss. Obtain lipid prior to next visit

## 2012-03-26 NOTE — Patient Instructions (Signed)
Please schedule fasting labs prior to next visit chem7-v58.69 and lipid-272.4 

## 2012-04-07 NOTE — Addendum Note (Signed)
Addended by: Regis Bill on: 04/07/2012 03:49 PM   Modules accepted: Orders

## 2012-05-25 ENCOUNTER — Other Ambulatory Visit: Payer: Self-pay | Admitting: *Deleted

## 2012-05-25 MED ORDER — VENLAFAXINE HCL ER 75 MG PO CP24: 75.0000 mg | ORAL_CAPSULE | Freq: Every day | ORAL | Status: DC

## 2012-05-25 NOTE — Telephone Encounter (Signed)
Venlafaxine XR CAP 75 mg [Last Rx 11.20.12 #90x2]/SLS Please advise.

## 2012-06-08 ENCOUNTER — Telehealth: Payer: Self-pay | Admitting: Internal Medicine

## 2012-06-08 ENCOUNTER — Ambulatory Visit (INDEPENDENT_AMBULATORY_CARE_PROVIDER_SITE_OTHER): Payer: 59 | Admitting: Internal Medicine

## 2012-06-08 ENCOUNTER — Encounter: Payer: Self-pay | Admitting: Internal Medicine

## 2012-06-08 VITALS — BP 110/84 | HR 84 | Temp 98.1°F | Resp 16 | Wt 197.5 lb

## 2012-06-08 DIAGNOSIS — R42 Dizziness and giddiness: Secondary | ICD-10-CM

## 2012-06-08 DIAGNOSIS — Z79899 Other long term (current) drug therapy: Secondary | ICD-10-CM

## 2012-06-08 LAB — BASIC METABOLIC PANEL
BUN: 17 mg/dL (ref 6–23)
Chloride: 101 mEq/L (ref 96–112)
Glucose, Bld: 50 mg/dL — ABNORMAL LOW (ref 70–99)
Potassium: 4.3 mEq/L (ref 3.5–5.3)

## 2012-06-08 LAB — CBC WITH DIFFERENTIAL/PLATELET
Basophils Relative: 0 % (ref 0–1)
HCT: 43.4 % (ref 36.0–46.0)
Hemoglobin: 15.1 g/dL — ABNORMAL HIGH (ref 12.0–15.0)
MCHC: 34.8 g/dL (ref 30.0–36.0)
MCV: 85.6 fL (ref 78.0–100.0)
Monocytes Absolute: 0.5 10*3/uL (ref 0.1–1.0)
Monocytes Relative: 8 % (ref 3–12)
Neutro Abs: 3.9 10*3/uL (ref 1.7–7.7)

## 2012-06-08 MED ORDER — GABAPENTIN 300 MG PO CAPS: 300.0000 mg | ORAL_CAPSULE | Freq: Two times a day (BID) | ORAL | Status: DC

## 2012-06-08 MED ORDER — VENLAFAXINE HCL ER 75 MG PO CP24: 75.0000 mg | ORAL_CAPSULE | Freq: Every day | ORAL | Status: DC

## 2012-06-08 NOTE — Telephone Encounter (Signed)
Patient states that [facility] has never received refill for Effexor. She would also like refill on gabapentin to be sent to [facility]. Patient states that she is out of both medications.

## 2012-06-08 NOTE — Telephone Encounter (Signed)
Effexor sent to [facility] via escript 12.02.13 #90x2; pt will need to have pharmacy send request if not received; Gabapentin sent in error to [facility]] 11.20.13 #180x2, resent to [facility] with same instructions; per Vo TWH, Ok to resend Effexor Rx/SLS

## 2012-06-09 ENCOUNTER — Telehealth: Payer: Self-pay | Admitting: Internal Medicine

## 2012-06-09 NOTE — Telephone Encounter (Signed)
PATIENT CALLED TO STATE [facility] GOT HER EFFEXOR RX AND IS NOW FILLING IT

## 2012-06-24 DIAGNOSIS — R42 Dizziness and giddiness: Secondary | ICD-10-CM | POA: Insufficient documentation

## 2012-06-24 NOTE — Progress Notes (Signed)
  Subjective:    [name], female    DOB: 1956-02-22, 57 y.o.   MRN: [id number]  HPI Pt presents to clinic for evaluation of dizziness. C/o dizziness and associated nausea without associated neurologic deficit. No alleviating or exacerbating factors. Notes sx's may have begun after running out of effexor.   Past Medical History  Diagnosis Date  . Osteoarthritis     knees  . Hypoglycemia   . Visual disturbance     visual flashes, referred to [facility] (Normal funduscopic exam)  . Allergic rhinitis   . Snoring 2008    negative sleep study of OSA  . Colon polyps   . Hot flashes   . Facial lesion 06/2010    removed from right side of face--precancerous  . GERD (gastroesophageal reflux disease)   . Hypertension   . Adenomatous colon polyp    Past Surgical History  Procedure Date  . Abdominal hysterectomy 2002  . Cervical fusion 06/15/09    right sided C6-C7 radiculopathy status post anterior cervical diskectomy and fusion at the C5-6 and C6-7 levels. Estill Bamberg, MD  . Carpal tunnel release 05/2010, 06/2010    bilateral--Dr Ave Filter  . Colonoscopy     reports that she has never smoked. She has never used smokeless tobacco. She reports that she does not drink alcohol or use illicit drugs. family history includes Colon cancer in her mother and Stroke in her mother. No Known Allergies   Review of Systems see hpi     Objective:   Physical Exam  Nursing note and vitals reviewed. Constitutional: She appears well-developed and well-nourished. No distress.  HENT:  Head: Normocephalic and atraumatic.  Right Ear: External ear normal.  Left Ear: External ear normal.  Eyes: Conjunctivae normal and EOM are normal. Pupils are equal, round, and reactive to light. No scleral icterus.  Neck: Neck supple.  Cardiovascular: Normal rate, regular rhythm and normal heart sounds.  Exam reveals no gallop and no friction rub.   No murmur heard. Pulmonary/Chest:  Effort normal and breath sounds normal. No respiratory distress. She has no wheezes. She has no rales.  Neurological: She is alert. No cranial nerve deficit. Coordination normal.  Skin: She is not diaphoretic.  Psychiatric: She has a normal mood and affect.          Assessment & Plan:

## 2012-06-24 NOTE — Assessment & Plan Note (Signed)
Obtain cbc and chem7. Suspect due to cessation of effexor. Resume effexor. Followup if no improvement or worsening.

## 2012-07-28 ENCOUNTER — Other Ambulatory Visit: Payer: Self-pay

## 2012-07-28 MED ORDER — GABAPENTIN 300 MG PO CAPS: 300.0000 mg | ORAL_CAPSULE | Freq: Two times a day (BID) | ORAL | Status: DC

## 2012-08-21 ENCOUNTER — Telehealth: Payer: Self-pay | Admitting: Internal Medicine

## 2012-08-21 MED ORDER — GABAPENTIN 300 MG PO CAPS: 300.0000 mg | ORAL_CAPSULE | Freq: Two times a day (BID) | ORAL | Status: DC

## 2012-08-21 NOTE — Telephone Encounter (Signed)
Caller: Julie Gray/Patient; Phone: [phone]; Reason for Call: Pt calling today 08/21/12 regarding she had sent a request to office to have her Gabapentin refilled to [facility].  They have never received the refill.  [facility] is sending a refill request today to the office and she wants to make sure the office will be looking for this because she is currently out of medication.  [facility] pulled chart and looks like this medication was refilled 07/28/12 to [facility], [place].  Pt said they evidently never received this because she just talked with them and they said they never received this.  She is not sure about the actual mailing address.  Said she refills on line. PLEASE CALL PT BACK AT [phone] TO LET HER KNOW THIS HAS BEEN PROCESSED.  Thanks.

## 2012-08-21 NOTE — Telephone Encounter (Signed)
i called and informed pt that I sent the Gabapentin to [facility] again for her.

## 2012-09-07 ENCOUNTER — Encounter: Payer: Self-pay | Admitting: Family

## 2012-09-07 ENCOUNTER — Ambulatory Visit (INDEPENDENT_AMBULATORY_CARE_PROVIDER_SITE_OTHER): Payer: 59 | Admitting: Family Medicine

## 2012-09-07 VITALS — BP 104/72 | HR 85 | Temp 98.3°F | Resp 16 | Wt 204.0 lb

## 2012-09-07 DIAGNOSIS — J019 Acute sinusitis, unspecified: Secondary | ICD-10-CM

## 2012-09-07 DIAGNOSIS — I10 Essential (primary) hypertension: Secondary | ICD-10-CM

## 2012-09-07 MED ORDER — METHYLPREDNISOLONE ACETATE 40 MG/ML IJ SUSP
40.0000 mg | Freq: Once | INTRAMUSCULAR | Status: AC
  Administered 2012-09-07: 40 mg via INTRAMUSCULAR

## 2012-09-07 MED ORDER — AMOXICILLIN-POT CLAVULANATE 875-125 MG PO TABS: 1.0000 | ORAL_TABLET | Freq: Two times a day (BID) | ORAL | Status: DC

## 2012-09-07 MED ORDER — PROBIOTIC PO CAPS: ORAL_CAPSULE | ORAL | Status: DC

## 2012-09-07 MED ORDER — GUAIFENESIN ER 600 MG PO TB12: 600.0000 mg | ORAL_TABLET | Freq: Two times a day (BID) | ORAL | Status: AC

## 2012-09-07 NOTE — Patient Instructions (Addendum)

## 2012-09-07 NOTE — Progress Notes (Signed)
[name], female   DOB: 08/02/1955, 57 y.o.   MRN: [id number] Julie Gray [id number] 04-01-1956 09/07/2012      Progress Note-Follow Up  Subjective  Chief Complaint  Chief Complaint  Patient presents with  . Nasal Congestion    Pt reports nasal congestion x 4 days, now has productive cough. Husband recently diagnosed with sinus infection.    HPI  57 year old Caucasian female who is in today complaining of congestion. She's been sick for 4-5 days now. She headache and sinus pressure. She has rhinorrhea. She's a cough productive of green phlegm. Some low-grade malaise, myalgias fatigue and possible low-grade fevers are noted. No chest pain but he chest congestion. No shortness of breath palpitations. No GI or GU complaints she has not tried anything over-the-counter thus far  Past Medical History  Diagnosis Date  . Osteoarthritis     knees  . Hypoglycemia   . Visual disturbance     visual flashes, referred to [facility] (Normal funduscopic exam)  . Allergic rhinitis   . Snoring 2008    negative sleep study of OSA  . Colon polyps   . Hot flashes   . Facial lesion 06/2010    removed from right side of face--precancerous  . GERD (gastroesophageal reflux disease)   . Hypertension   . Adenomatous colon polyp     Past Surgical History  Procedure Laterality Date  . Abdominal hysterectomy  2002  . Cervical fusion  06/15/09    right sided C6-C7 radiculopathy status post anterior cervical diskectomy and fusion at the C5-6 and C6-7 levels. Estill Bamberg, MD  . Carpal tunnel release  05/2010, 06/2010    bilateral--Dr Ave Filter  . Colonoscopy      Family History  Problem Relation Age of Onset  . Stroke Mother   . Colon cancer Mother     History   Social History  . Marital Status: Married    Spouse Name: N/A    Number of Children: N/A  . Years of Education: N/A   Occupational History  . Writer for [employer]     Social History Main Topics  . 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 Smokeless tobacco: Never Used  . Alcohol Use: No  . Drug Use: No  . Sexually Active: Not on file   Other Topics Concern  . Not on file   Social History Narrative  . No narrative on file    Current Outpatient Prescriptions on File Prior to Visit  Medication Sig Dispense Refill  . aspirin 81 MG EC tablet Take 81 mg by mouth daily.        Marland Kitchen estradiol (CLIMARA) 0.025 mg/24hr Place 1 patch (0.025 mg total) onto the skin once a week.  4 patch  12  . fexofenadine (ALLEGRA) 180 MG tablet Take 180 mg by mouth at bedtime as needed.       . gabapentin (NEURONTIN) 300 MG capsule Take 1 capsule (300 mg total) by mouth 2 (two) times daily at 10 AM and 5 PM.  180 capsule  2  . Glucosamine-Chondroitin-Vit D3 1500-1200-800 MG-MG-UNIT PACK Take 1 tablet by mouth 2 (two) times daily.        Marland Kitchen olmesartan-hydrochlorothiazide (BENICAR HCT) 40-12.5 MG per tablet Take 1 tablet by mouth daily.  90 tablet  4  . omeprazole (PRILOSEC) 20 MG capsule Take 2 capsules (40 mg total) by mouth daily.  180 capsule  4  . Soy Isoflavones 40 MG TABS  Take 1 tablet by mouth 2 (two) times daily.        Marland Kitchen venlafaxine XR (EFFEXOR-XR) 75 MG 24 hr capsule Take 1 capsule (75 mg total) by mouth daily.  90 capsule  2  . mometasone (NASONEX) 50 MCG/ACT nasal spray Place 2 sprays into both nostrils daily as needed.        No current facility-administered medications on file prior to visit.    No Known Allergies  Review of Systems  Review of Systems  Constitutional: Positive for malaise/fatigue. Negative for fever.  HENT: Positive for congestion.   Eyes: Negative for discharge.  Respiratory: Positive for cough and sputum production. Negative for shortness of breath.   Cardiovascular: Negative for chest pain, palpitations and leg swelling.  Gastrointestinal: Negative for nausea, abdominal pain and diarrhea.  Genitourinary: Negative for dysuria.   Musculoskeletal: Negative for falls.  Skin: Negative for rash.  Neurological: Positive for headaches. Negative for loss of consciousness.  Endo/Heme/Allergies: Negative for polydipsia.  Psychiatric/Behavioral: Negative for depression and suicidal ideas. The patient is not nervous/anxious and does not have insomnia.     Objective  BP 104/72  Pulse 85  Temp(Src) 98.3 F (36.8 C) (Oral)  Resp 16  Wt 204 lb (92.534 kg)  BMI 35 kg/m2  SpO2 96%  Physical Exam  Physical Exam  Constitutional: She is oriented to person, place, and time and well-developed, well-nourished, and in no distress. No distress.  HENT:  Head: Normocephalic and atraumatic.  Nasal mucosa boggy and erythematous  Eyes: Conjunctivae are normal.  Neck: Neck supple. No thyromegaly present.  Cardiovascular: Normal rate, regular rhythm and normal heart sounds.   No murmur heard. Pulmonary/Chest: Effort normal and breath sounds normal. She has no wheezes.  Abdominal: She exhibits no distension and no mass.  Musculoskeletal: She exhibits no edema.  Lymphadenopathy:    She has no cervical adenopathy.  Neurological: She is alert and oriented to person, place, and time.  Skin: Skin is warm and dry. No rash noted. She is not diaphoretic.  Psychiatric: Memory, affect and judgment normal.    Lab Results  Component Value Date   TSH 1.91 10/10/2008   Lab Results  Component Value Date   WBC 5.9 06/08/2012   HGB 15.1* 06/08/2012   HCT 43.4 06/08/2012   MCV 85.6 06/08/2012   PLT 228 06/08/2012   Lab Results  Component Value Date   CREATININE 0.88 06/08/2012   BUN 17 06/08/2012   NA 141 06/08/2012   K 4.3 06/08/2012   CL 101 06/08/2012   CO2 30 06/08/2012   Lab Results  Component Value Date   ALT 18 12/09/2011   AST 20 12/09/2011   ALKPHOS 93 12/09/2011   BILITOT 0.4 12/09/2011   Lab Results  Component Value Date   CHOL 201* 12/09/2011   Lab Results  Component Value Date   HDL 44 12/09/2011   Lab Results   Component Value Date   LDLCALC 129* 12/09/2011   Lab Results  Component Value Date   TRIG 142 12/09/2011   Lab Results  Component Value Date   CHOLHDL 4.6 12/09/2011     Assessment & Plan  Sinusitis, acute Started on probiotics, mucinex and Augmentin, increase rest and hydration, report if no improvement  HYPERTENSION Well controlled at today's visit. No changes

## 2012-09-07 NOTE — Assessment & Plan Note (Signed)
Well controlled at today's visit. No changes 

## 2012-09-07 NOTE — Assessment & Plan Note (Signed)
Started on probiotics, mucinex and Augmentin, increase rest and hydration, report if no improvement

## 2012-12-04 ENCOUNTER — Other Ambulatory Visit: Payer: Self-pay | Admitting: Internal Medicine

## 2012-12-04 NOTE — Telephone Encounter (Signed)
Please advise re: estradiol refill. Last rx sent 11/25/11, #4 patch x 12 refills.

## 2012-12-04 NOTE — Telephone Encounter (Signed)
i have sent a 6 month supply but warn her she has to have an annual exam before she can have more

## 2012-12-22 ENCOUNTER — Other Ambulatory Visit: Payer: Self-pay | Admitting: Obstetrics & Gynecology

## 2013-01-18 ENCOUNTER — Other Ambulatory Visit: Payer: Self-pay

## 2013-01-18 MED ORDER — OMEPRAZOLE 20 MG PO CPDR: 40.0000 mg | DELAYED_RELEASE_CAPSULE | Freq: Every day | ORAL | Status: DC

## 2013-01-18 MED ORDER — OLMESARTAN MEDOXOMIL-HCTZ 40-12.5 MG PO TABS: 1.0000 | ORAL_TABLET | Freq: Every day | ORAL | Status: DC

## 2013-01-18 MED ORDER — VENLAFAXINE HCL ER 75 MG PO CP24: 75.0000 mg | ORAL_CAPSULE | Freq: Every day | ORAL | Status: DC

## 2013-01-20 ENCOUNTER — Other Ambulatory Visit: Payer: Self-pay

## 2013-01-20 MED ORDER — OLMESARTAN MEDOXOMIL-HCTZ 40-12.5 MG PO TABS: 1.0000 | ORAL_TABLET | Freq: Every day | ORAL | Status: DC

## 2013-01-20 MED ORDER — VENLAFAXINE HCL ER 75 MG PO CP24: 75.0000 mg | ORAL_CAPSULE | Freq: Every day | ORAL | Status: DC

## 2013-01-20 NOTE — Telephone Encounter (Signed)
Patient left a message stating she received an email yesterday stating that [facility] received the RX for Gabapentin. Pt would like the Venlafaxine and Benicar sent to pharmacy.  RX's sent

## 2013-03-30 ENCOUNTER — Other Ambulatory Visit: Payer: Self-pay | Admitting: Family Medicine

## 2013-05-24 ENCOUNTER — Other Ambulatory Visit: Payer: Self-pay | Admitting: Family Medicine

## 2013-05-25 NOTE — Telephone Encounter (Signed)
Refill sent for Climara patch.  Pt last seen in march and has no future appts on file.  Please advise when pt should follow up again?

## 2013-05-25 NOTE — Telephone Encounter (Signed)
Needs annual check up. March was only an acute. She can have 3 months of the Climara but before that is out needs an annual exam with labs (lipid, renal, cbc, tsh, hepatic) unless she does labs elsewhere

## 2013-05-26 NOTE — Telephone Encounter (Signed)
Left detailed message informing patient of medication refill and to call our office to schedule cpe °

## 2013-05-26 NOTE — Telephone Encounter (Signed)
Please call pt to arrange fasting physical in 3 months then route note back to [place] to enter future labs.

## 2013-07-19 ENCOUNTER — Telehealth: Payer: Self-pay | Admitting: Family Medicine

## 2013-07-19 NOTE — Telephone Encounter (Signed)
Has an appt with Dr B on 08-19-2013.  Needs Benicar Hzt called in to [facility] to last till then.

## 2013-07-20 MED ORDER — OLMESARTAN MEDOXOMIL-HCTZ 40-12.5 MG PO TABS: 1.0000 | ORAL_TABLET | Freq: Every day | ORAL | Status: DC

## 2013-08-19 ENCOUNTER — Ambulatory Visit: Payer: 59 | Admitting: Family Medicine

## 2013-08-26 ENCOUNTER — Encounter: Payer: Self-pay | Admitting: Family Medicine

## 2013-08-26 ENCOUNTER — Ambulatory Visit (INDEPENDENT_AMBULATORY_CARE_PROVIDER_SITE_OTHER): Payer: Managed Care, Other (non HMO) | Admitting: Family Medicine

## 2013-08-26 VITALS — BP 100/60 | HR 76 | Temp 98.2°F | Ht 64.0 in | Wt 202.1 lb

## 2013-08-26 DIAGNOSIS — I10 Essential (primary) hypertension: Secondary | ICD-10-CM

## 2013-08-26 DIAGNOSIS — J309 Allergic rhinitis, unspecified: Secondary | ICD-10-CM

## 2013-08-26 DIAGNOSIS — E785 Hyperlipidemia, unspecified: Secondary | ICD-10-CM

## 2013-08-26 DIAGNOSIS — E669 Obesity, unspecified: Secondary | ICD-10-CM

## 2013-08-26 MED ORDER — GABAPENTIN 300 MG PO CAPS: 300.0000 mg | ORAL_CAPSULE | Freq: Two times a day (BID) | ORAL | Status: DC

## 2013-08-26 MED ORDER — VENLAFAXINE HCL ER 75 MG PO CP24: 75.0000 mg | ORAL_CAPSULE | Freq: Every day | ORAL | Status: DC

## 2013-08-26 MED ORDER — OMEPRAZOLE 20 MG PO CPDR: 40.0000 mg | DELAYED_RELEASE_CAPSULE | Freq: Every day | ORAL | Status: DC

## 2013-08-26 MED ORDER — OLMESARTAN MEDOXOMIL-HCTZ 40-12.5 MG PO TABS: 1.0000 | ORAL_TABLET | Freq: Every day | ORAL | Status: DC

## 2013-08-26 MED ORDER — ESTRADIOL 0.025 MG/24HR TD PTWK: 0.0250 mg | MEDICATED_PATCH | TRANSDERMAL | Status: DC

## 2013-08-26 NOTE — Progress Notes (Signed)
Pre visit review using our clinic review tool, if applicable. No additional management support is needed unless otherwise documented below in the visit note. 

## 2013-08-26 NOTE — Progress Notes (Signed)
[name], female   DOB: 1956/01/03, 58 y.o.   MRN: [id number] TANYA CROTHERS [id number] Mar 08, 1956 08/26/2013      Progress Note-Follow Up  Subjective  Chief Complaint  Chief Complaint  Patient presents with  . Follow-up    HPI  Is a 58 year female who is in today for followup. Generally been doing well. No recent illness. No chest pain, palpitations, shortness of breath, GI or GU concerns. Taking medications as prescribed. Allergies well controlled on current meds when necessary  Past Medical History  Diagnosis Date  . Osteoarthritis     knees  . Hypoglycemia   . Visual disturbance     visual flashes, referred to [facility] (Normal funduscopic exam)  . Allergic rhinitis   . Snoring 2008    negative sleep study of OSA  . Colon polyps   . Hot flashes   . Facial lesion 06/2010    removed from right side of face--precancerous  . GERD (gastroesophageal reflux disease)   . Hypertension   . Adenomatous colon polyp     Past Surgical History  Procedure Laterality Date  . Abdominal hysterectomy  2002  . Cervical fusion  06/15/09    right sided C6-C7 radiculopathy status post anterior cervical diskectomy and fusion at the C5-6 and C6-7 levels. Phylliss Bob, MD  . Carpal tunnel release  05/2010, 06/2010    bilateral--Dr Tamera Punt  . Colonoscopy      Family History  Problem Relation Age of Onset  . Stroke Mother   . Colon cancer Mother     History   Social History  . Marital Status: Married    Spouse Name: N/A    Number of Children: N/A  . Years of Education: N/A   Occupational History  . 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 Smokeless tobacco: Never Used  . Alcohol Use: No  . Drug Use: No  . Sexual Activity: Not on file   Other Topics Concern  . Not on file   Social History Narrative  . No narrative on file    Current Outpatient Prescriptions on  File Prior to Visit  Medication Sig Dispense Refill  . aspirin 81 MG EC tablet Take 81 mg by mouth daily.        Marland Kitchen estradiol (CLIMARA - DOSED IN MG/24 HR) 0.025 mg/24hr patch PLACE 1 PATCH ONTO SKIN ONCE A WEEK  4 patch  5  . fexofenadine (ALLEGRA) 180 MG tablet Take 180 mg by mouth at bedtime as needed.       . gabapentin (NEURONTIN) 300 MG capsule Take 1 capsule (300 mg  total) by mouth 2 (two)  times daily at 10 AM and 5  PM.  180 capsule  1  . Glucosamine-Chondroitin-Vit D3 1500-1200-800 MG-MG-UNIT PACK Take 1 tablet by mouth 2 (two) times daily.        Marland Kitchen guaiFENesin (MUCINEX) 600 MG 12 hr tablet Take 1 tablet (600 mg total) by mouth 2 (two) times daily.  28 tablet  0  . mometasone (NASONEX) 50 MCG/ACT nasal spray Place 2 sprays into both nostrils daily as needed.       Marland Kitchen olmesartan-hydrochlorothiazide (BENICAR HCT) 40-12.5 MG per tablet Take 1 tablet by mouth daily.  90 tablet  0  . omeprazole (PRILOSEC) 20 MG capsule Take 2 capsules (40 mg total) by mouth daily.  180 capsule  1  .  PROBIOTIC CAPS Digestive Advantage, Align or generic daily      . Soy Isoflavones 40 MG TABS Take 1 tablet by mouth 2 (two) times daily.        Marland Kitchen venlafaxine XR (EFFEXOR-XR) 75 MG 24 hr capsule Take 1 capsule (75 mg total) by mouth daily.  90 capsule  1   No current facility-administered medications on file prior to visit.    No Known Allergies  Review of Systems  Review of Systems  Constitutional: Negative for fever and malaise/fatigue.  HENT: Negative for congestion.   Eyes: Negative for discharge.  Respiratory: Negative for shortness of breath.   Cardiovascular: Negative for chest pain, palpitations and leg swelling.  Gastrointestinal: Negative for nausea, abdominal pain and diarrhea.  Genitourinary: Negative for dysuria.  Musculoskeletal: Negative for falls.  Skin: Negative for rash.  Neurological: Negative for loss of consciousness and headaches.  Endo/Heme/Allergies: Negative for polydipsia.   Psychiatric/Behavioral: Negative for depression and suicidal ideas. The patient is not nervous/anxious and does not have insomnia.     Objective  BP 100/60  Pulse 76  Temp(Src) 98.2 F (36.8 C) (Oral)  Ht 5\' 4"  (1.626 m)  Wt 202 lb 1.9 oz (91.681 kg)  BMI 34.68 kg/m2  SpO2 97%  Physical Exam  Physical Exam  Constitutional: She is oriented to person, place, and time and well-developed, well-nourished, and in no distress. No distress.  HENT:  Head: Normocephalic and atraumatic.  Eyes: Conjunctivae are normal.  Neck: Neck supple. No thyromegaly present.  Cardiovascular: Normal rate, regular rhythm and normal heart sounds.   No murmur heard. Pulmonary/Chest: Effort normal and breath sounds normal. She has no wheezes.  Abdominal: She exhibits no distension and no mass.  Musculoskeletal: She exhibits no edema.  Lymphadenopathy:    She has no cervical adenopathy.  Neurological: She is alert and oriented to person, place, and time.  Skin: Skin is warm and dry. No rash noted. She is not diaphoretic.  Psychiatric: Memory, affect and judgment normal.    Lab Results  Component Value Date   TSH 1.91 10/10/2008   Lab Results  Component Value Date   WBC 5.9 06/08/2012   HGB 15.1* 06/08/2012   HCT 43.4 06/08/2012   MCV 85.6 06/08/2012   PLT 228 06/08/2012   Lab Results  Component Value Date   CREATININE 0.88 06/08/2012   BUN 17 06/08/2012   NA 141 06/08/2012   K 4.3 06/08/2012   CL 101 06/08/2012   CO2 30 06/08/2012   Lab Results  Component Value Date   ALT 18 12/09/2011   AST 20 12/09/2011   ALKPHOS 93 12/09/2011   BILITOT 0.4 12/09/2011   Lab Results  Component Value Date   CHOL 201* 12/09/2011   Lab Results  Component Value Date   HDL 44 12/09/2011   Lab Results  Component Value Date   LDLCALC 129* 12/09/2011   Lab Results  Component Value Date   TRIG 142 12/09/2011   Lab Results  Component Value Date   CHOLHDL 4.6 12/09/2011     Assessment &  Plan  HYPERTENSION Well controlled on current meds no changes  Obesity Encouraged DASH diet and regular exercise  Other and unspecified hyperlipidemia Avoid trans fats, minimize simple carbs, saturated fats, increase exercise  ALLERGIC RHINITIS Well controlled on current meds prn

## 2013-08-26 NOTE — Patient Instructions (Signed)

## 2013-08-29 NOTE — Assessment & Plan Note (Signed)
Well controlled on current meds no changes 

## 2013-08-29 NOTE — Assessment & Plan Note (Signed)
Well controlled on current meds prn

## 2013-08-29 NOTE — Assessment & Plan Note (Signed)
Encouraged DASH diet and regular exercise 

## 2013-08-29 NOTE — Assessment & Plan Note (Signed)
Avoid trans fats, minimize simple carbs, saturated fats, increase exercise

## 2013-09-22 ENCOUNTER — Ambulatory Visit: Payer: Managed Care, Other (non HMO) | Admitting: Physician Assistant

## 2013-09-30 ENCOUNTER — Ambulatory Visit (INDEPENDENT_AMBULATORY_CARE_PROVIDER_SITE_OTHER): Payer: Managed Care, Other (non HMO) | Admitting: Physician Assistant

## 2013-09-30 ENCOUNTER — Encounter: Payer: Self-pay | Admitting: Physician Assistant

## 2013-09-30 VITALS — BP 116/72 | HR 75 | Temp 98.2°F | Resp 16 | Ht 64.0 in | Wt 205.2 lb

## 2013-09-30 DIAGNOSIS — L0390 Cellulitis, unspecified: Principal | ICD-10-CM

## 2013-09-30 DIAGNOSIS — L0291 Cutaneous abscess, unspecified: Secondary | ICD-10-CM

## 2013-09-30 MED ORDER — SULFAMETHOXAZOLE-TMP DS 800-160 MG PO TABS: 1.0000 | ORAL_TABLET | Freq: Two times a day (BID) | ORAL | Status: DC

## 2013-09-30 NOTE — Patient Instructions (Signed)
Please take antibiotic as prescribed with food.  Monitor area.  Call if symptoms have improved within 1 week or if symptoms worsen.  If area persists, you will need evaluation by general surgeon.   I do not think this is breast related at present time.

## 2013-10-01 ENCOUNTER — Telehealth: Payer: Self-pay | Admitting: Family Medicine

## 2013-10-01 DIAGNOSIS — L0291 Cutaneous abscess, unspecified: Secondary | ICD-10-CM | POA: Insufficient documentation

## 2013-10-01 DIAGNOSIS — L0390 Cellulitis, unspecified: Principal | ICD-10-CM

## 2013-10-01 NOTE — Telephone Encounter (Signed)
PA paperwork for Benicar received, forward to nurse

## 2013-10-01 NOTE — Assessment & Plan Note (Signed)
No drainage expressed.  Rx Bactrim.  Reassured patient that this area above breast seems consistent with infection and not malignancy.  Follow-up in 1 week.  If not improving, we will proceed with [place] and referral to gen surgery.

## 2013-10-01 NOTE — Progress Notes (Addendum)
Patient presents to clinic today c/o reddened, tender "lump" above her right breast that has been present for 4 days.  The area of concerns has grown in size and tenderness.  Patient denies puncture or insect bite.  Denies fever, chills, sweats.  Patient without history of breast cancer or abnormal mammogram.  Last mammogram 1 year ago.  Patient is very anxious because she is concerned this could be cancer.  Past Medical History  Diagnosis Date  . Osteoarthritis     knees  . Hypoglycemia   . Visual disturbance     visual flashes, referred to [facility] (Normal funduscopic exam)  . Allergic rhinitis   . Snoring 2008    negative sleep study of OSA  . Colon polyps   . Hot flashes   . Facial lesion 06/2010    removed from right side of face--precancerous  . GERD (gastroesophageal reflux disease)   . Hypertension   . Adenomatous colon polyp     Current Outpatient Prescriptions on File Prior to Visit  Medication Sig Dispense Refill  . aspirin 81 MG EC tablet Take 81 mg by mouth daily.        Marland Kitchen estradiol (CLIMARA - DOSED IN MG/24 HR) 0.025 mg/24hr patch Place 1 patch (0.025 mg total) onto the skin once a week.  12 patch  2  . fexofenadine (ALLEGRA) 180 MG tablet Take 180 mg by mouth at bedtime as needed.       . gabapentin (NEURONTIN) 300 MG capsule Take 1 capsule (300 mg total) by mouth 2 (two) times daily.  180 capsule  1  . Glucosamine-Chondroitin-Vit D3 1500-1200-800 MG-MG-UNIT PACK Take 1 tablet by mouth 2 (two) times daily.        . mometasone (NASONEX) 50 MCG/ACT nasal spray Place 2 sprays into both nostrils daily as needed.       Marland Kitchen olmesartan-hydrochlorothiazide (BENICAR HCT) 40-12.5 MG per tablet Take 1 tablet by mouth daily.  90 tablet  1  . omeprazole (PRILOSEC) 20 MG capsule Take 2 capsules (40 mg total) by mouth daily.  180 capsule  1  . PROBIOTIC CAPS Digestive Advantage, Align or generic daily      . Soy Isoflavones 40 MG TABS Take 1 tablet by mouth 2 (two) times  daily.        Marland Kitchen venlafaxine XR (EFFEXOR-XR) 75 MG 24 hr capsule Take 1 capsule (75 mg total) by mouth daily.  90 capsule  1   No current facility-administered medications on file prior to visit.    No Known Allergies  Family History  Problem Relation Age of Onset  . Stroke Mother   . Colon cancer Mother     History   Social History  . Marital Status: Married    Spouse Name: N/A    Number of Children: N/A  . Years of Education: N/A   Occupational History  . 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 Smokeless tobacco: Never Used  . Alcohol Use: No  . Drug Use: No  . Sexual Activity: None   Other Topics Concern  . None   Social History Narrative  . None   Review of Systems - See HPI.  All other ROS are negative.  BP 116/72  Pulse 75  Temp(Src) 98.2 F (36.8 C) (Oral)  Resp 16  Ht 5\' 4"  (1.626 m)  Wt 205 lb 4 oz (93.101 kg)  BMI 35.21  kg/m2  SpO2 98%  Physical Exam  Vitals reviewed. Constitutional: She is well-developed, well-nourished, and in no distress.  HENT:  Head: Normocephalic and atraumatic.  Eyes: Conjunctivae are normal.  Cardiovascular: Normal rate, regular rhythm, normal heart sounds and intact distal pulses.   Pulmonary/Chest: Effort normal and breath sounds normal. She has no wheezes. She has no rales. She exhibits no tenderness. Right breast exhibits no inverted nipple, no nipple discharge, no skin change and no tenderness. Left breast exhibits no inverted nipple, no mass, no nipple discharge, no skin change and no tenderness. Breasts are symmetrical.    Presence of a warm, tender and erythematous region above right breath that with slight firmness and ~ 1 cm in diameter.  Seems consistent with cellulitis.  Psychiatric:  Slightly anxious affect   Assessment/Plan: Cellulitis and abscess No drainage expressed.  Rx Bactrim.  Reassured patient that this area above breast  seems consistent with infection and not malignancy.  Follow-up in 1 week.  If not improving, we will proceed with [place] and referral to gen surgery.

## 2013-10-08 ENCOUNTER — Telehealth: Payer: Self-pay | Admitting: *Deleted

## 2013-10-08 ENCOUNTER — Other Ambulatory Visit: Payer: Self-pay | Admitting: Physician Assistant

## 2013-10-08 DIAGNOSIS — N6310 Unspecified lump in the right breast, unspecified quadrant: Secondary | ICD-10-CM

## 2013-10-08 NOTE — Telephone Encounter (Signed)
Spoke with the pt and informed her of Cody's recommendation and note below.  Pt understood and agreed.  Pt was scheduled an appt for Monday (10-11-13).//AB/CMA

## 2013-10-08 NOTE — Telephone Encounter (Signed)
Pt called to said she seen on last Thurs for lump on (R) breast.  She was given an ATB for 1 week, which she finished.  Pt stated the lump is still there and she wants to know what it is.  She want to know what is the next step.//AB/CMA

## 2013-10-08 NOTE — Telephone Encounter (Signed)
Would like for her to come in for re-evaluation now that she in on antibiotic.  Remind her that I did say that the antibiotic would help kill off infection and decrease redness, tenderness and warmth.  "hardness" of lump would still remain until skin had time to heal.  I want her to come back in on Monday so we can reassess it.  I am also going to order an ultrasound of the R breast.  She will be contacted for imaging appointment.

## 2013-10-11 ENCOUNTER — Ambulatory Visit (INDEPENDENT_AMBULATORY_CARE_PROVIDER_SITE_OTHER): Payer: Managed Care, Other (non HMO) | Admitting: Physician Assistant

## 2013-10-11 ENCOUNTER — Encounter: Payer: Self-pay | Admitting: Physician Assistant

## 2013-10-11 ENCOUNTER — Other Ambulatory Visit: Payer: Managed Care, Other (non HMO)

## 2013-10-11 VITALS — BP 98/65 | HR 77 | Temp 97.7°F | Resp 16 | Ht 64.0 in | Wt 202.5 lb

## 2013-10-11 DIAGNOSIS — J309 Allergic rhinitis, unspecified: Secondary | ICD-10-CM | POA: Insufficient documentation

## 2013-10-11 DIAGNOSIS — L0291 Cutaneous abscess, unspecified: Secondary | ICD-10-CM

## 2013-10-11 DIAGNOSIS — L0390 Cellulitis, unspecified: Secondary | ICD-10-CM

## 2013-10-11 MED ORDER — METHYLPREDNISOLONE ACETATE 80 MG/ML IJ SUSP
60.0000 mg | Freq: Once | INTRAMUSCULAR | Status: AC
  Administered 2013-10-11: 60 mg via INTRAMUSCULAR

## 2013-10-11 NOTE — Patient Instructions (Signed)
The area above your right breast is looking better.  No sign of remaining infection.  Please keep appointment for imaging so we can further assess this area.  I will call you as soon as I have your results.  If new symptoms were to develop, do not hesitate to call the office.  For allergic sinusitis -- the steroid shot should begin to help symptoms.  Increase fluid intake.  Rest.  Continue allergy medications.  Place a humidifier in bedroom.

## 2013-10-11 NOTE — Progress Notes (Signed)
Pre visit review using our clinic review tool, if applicable. No additional management support is needed unless otherwise documented below in the visit note/SLS  

## 2013-10-11 NOTE — Assessment & Plan Note (Signed)
No evidence of active cellulitis.  Skin changes (hardening) from the cellulitis remain.  Patient already scheduled for Mammogram and [place] in the next week to rule/out underlying pathology.  Will refer to general surgery if indicated by imaging results.

## 2013-10-11 NOTE — Progress Notes (Signed)
Patient presents to clinic today for follow-up and re-assessment of tender lump above her right breast. Patient evaluated around 1 week ago.  No palpable lymphadenopathy noted. Diagnosis of cellulitis and abscess made. Started on antibiotic.  Since then patient states the area has become smaller in size.  Is no longer tender or warm to the touch.  Denies new associated symptoms.  Still denies fever, chills, sweats, weight loss.  Mammogram 1 year ago within normal limits.  Patient c/o sinus pressure and pos-nasal drip over the past week.  Patient was on bactrim for cellulitis/abscess.  Denies fever, chills, sweats, ear pain or tooth pain.  Has history of seasonal allergies.  Endorses dry cough, but denies SOB, wheezing or pleuritic chest pain.  Past Medical History  Diagnosis Date  . Osteoarthritis     knees  . Hypoglycemia   . Visual disturbance     visual flashes, referred to [facility] (Normal funduscopic exam)  . Allergic rhinitis   . Snoring 2008    negative sleep study of OSA  . Colon polyps   . Hot flashes   . Facial lesion 06/2010    removed from right side of face--precancerous  . GERD (gastroesophageal reflux disease)   . Hypertension   . Adenomatous colon polyp     Current Outpatient Prescriptions on File Prior to Visit  Medication Sig Dispense Refill  . aspirin 81 MG EC tablet Take 81 mg by mouth daily.        Marland Kitchen estradiol (CLIMARA - DOSED IN MG/24 HR) 0.025 mg/24hr patch Place 1 patch (0.025 mg total) onto the skin once a week.  12 patch  2  . fexofenadine (ALLEGRA) 180 MG tablet Take 180 mg by mouth at bedtime as needed.       . gabapentin (NEURONTIN) 300 MG capsule Take 1 capsule (300 mg total) by mouth 2 (two) times daily.  180 capsule  1  . Glucosamine-Chondroitin-Vit D3 1500-1200-800 MG-MG-UNIT PACK Take 1 tablet by mouth 2 (two) times daily.        . mometasone (NASONEX) 50 MCG/ACT nasal spray Place 2 sprays into both nostrils daily as needed.       Marland Kitchen  olmesartan-hydrochlorothiazide (BENICAR HCT) 40-12.5 MG per tablet Take 1 tablet by mouth daily.  90 tablet  1  . omeprazole (PRILOSEC) 20 MG capsule Take 2 capsules (40 mg total) by mouth daily.  180 capsule  1  . PROBIOTIC CAPS Digestive Advantage, Align or generic daily      . Soy Isoflavones 40 MG TABS Take 1 tablet by mouth 2 (two) times daily.        Marland Kitchen venlafaxine XR (EFFEXOR-XR) 75 MG 24 hr capsule Take 1 capsule (75 mg total) by mouth daily.  90 capsule  1   No current facility-administered medications on file prior to visit.    No Known Allergies  Family History  Problem Relation Age of Onset  . Stroke Mother   . Colon cancer Mother     History   Social History  . Marital Status: Married    Spouse Name: N/A    Number of Children: N/A  . Years of Education: N/A   Occupational History  . 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 Smokeless tobacco: Never Used  . Alcohol Use: No  . Drug Use: No  . Sexual Activity: None   Other Topics Concern  . None  Social History Narrative  . None   Review of Systems - See HPI.  All other ROS are negative.  BP 98/65  Pulse 77  Temp(Src) 97.7 F (36.5 C) (Oral)  Resp 16  Ht 5\' 4"  (1.626 m)  Wt 202 lb 8 oz (91.853 kg)  BMI 34.74 kg/m2  SpO2 98%  Physical Exam  Vitals reviewed. Constitutional: She is oriented to person, place, and time and well-developed, well-nourished, and in no distress.  HENT:  Head: Normocephalic and atraumatic.  Right Ear: External ear normal.  Left Ear: External ear normal.  Nose: Nose normal.  Mouth/Throat: No oropharyngeal exudate.  TM within normal limits bilaterally.    Cardiovascular: Normal rate, regular rhythm, normal heart sounds and intact distal pulses.   Pulmonary/Chest: Effort normal and breath sounds normal. No respiratory distress. She has no wheezes. She has no rales. She exhibits no tenderness.   Lymphadenopathy:       Head (right side): No submental, no submandibular, no tonsillar, no preauricular, no posterior auricular and no occipital adenopathy present.       Head (left side): No submental, no submandibular, no tonsillar, no preauricular, no posterior auricular and no occipital adenopathy present.    She has no cervical adenopathy.    She has no axillary adenopathy.       Right: No supraclavicular adenopathy present.       Left: No supraclavicular adenopathy present.  Neurological: She is alert and oriented to person, place, and time.  Skin: Skin is warm and dry.  Presence of hardened area above right breast, at area of previous cellulitis and abscess.  No fluctuation, tenderness, erythema or warmth noted on examination.  Psychiatric: Affect normal.   Assessment/Plan: Allergic sinusitis 60 mg of Depro Medrol given.  Encourage daily use of allergy medications. Humidifier in bedroom.  Unlikely bacterial etiology given recent antibiotic course.  Cellulitis and abscess No evidence of active cellulitis.  Skin changes (hardening) from the cellulitis remain.  Patient already scheduled for Mammogram and [place] in the next week to rule/out underlying pathology.  Will refer to general surgery if indicated by imaging results.

## 2013-10-11 NOTE — Assessment & Plan Note (Signed)
60 mg of Depro Medrol given.  Encourage daily use of allergy medications. Humidifier in bedroom.  Unlikely bacterial etiology given recent antibiotic course.

## 2013-10-11 NOTE — Addendum Note (Signed)
Addended by: Rockwell Germany on: 10/11/2013 02:50 PM   Modules accepted: Orders

## 2013-10-12 ENCOUNTER — Other Ambulatory Visit: Payer: Self-pay

## 2013-10-12 ENCOUNTER — Other Ambulatory Visit: Payer: Self-pay | Admitting: Physician Assistant

## 2013-10-12 DIAGNOSIS — N6310 Unspecified lump in the right breast, unspecified quadrant: Secondary | ICD-10-CM

## 2013-10-12 NOTE — Telephone Encounter (Signed)
Per md: let pt know that insurance won't pay for Benicar unless she has tried Losartan or Lisinopril.  Pt took Losartan 50 mg in 2012.   Paperwork refaxed

## 2013-10-15 NOTE — Telephone Encounter (Signed)
Received approval from [place]. Faxed it to [facility] and left detailed message on pt's home #.

## 2013-10-18 ENCOUNTER — Ambulatory Visit
Admission: RE | Admit: 2013-10-18 | Discharge: 2013-10-18 | Disposition: A | Discharge status: Home / Self Care | Payer: 59 | Source: Ambulatory Visit | Attending: Physician Assistant | Admitting: Physician Assistant

## 2013-10-18 ENCOUNTER — Other Ambulatory Visit: Payer: Self-pay | Admitting: Physician Assistant

## 2013-10-18 DIAGNOSIS — N6310 Unspecified lump in the right breast, unspecified quadrant: Secondary | ICD-10-CM

## 2013-10-19 NOTE — Telephone Encounter (Signed)
Received message from pt that [facility] said they did not get our fax. Left detailed message for pt that the Rx was originally sent to [facility] and they verified they shipped the medication to pt in march and it will not be due for refill until May. Advised them of approval and asked pt to call us if any further questions.

## 2014-01-18 ENCOUNTER — Encounter: Payer: Self-pay | Admitting: Family

## 2014-01-18 ENCOUNTER — Ambulatory Visit (INDEPENDENT_AMBULATORY_CARE_PROVIDER_SITE_OTHER): Payer: 59 | Admitting: Family

## 2014-01-18 VITALS — BP 100/70 | HR 81 | Temp 98.0°F | Resp 16 | Ht 64.0 in | Wt 205.1 lb

## 2014-01-18 DIAGNOSIS — F329 Major depressive disorder, single episode, unspecified: Secondary | ICD-10-CM | POA: Insufficient documentation

## 2014-01-18 DIAGNOSIS — I10 Essential (primary) hypertension: Secondary | ICD-10-CM | POA: Insufficient documentation

## 2014-01-18 DIAGNOSIS — F419 Anxiety disorder, unspecified: Secondary | ICD-10-CM

## 2014-01-18 DIAGNOSIS — K219 Gastro-esophageal reflux disease without esophagitis: Secondary | ICD-10-CM

## 2014-01-18 DIAGNOSIS — F341 Dysthymic disorder: Secondary | ICD-10-CM

## 2014-01-18 DIAGNOSIS — F32A Depression, unspecified: Secondary | ICD-10-CM

## 2014-01-18 LAB — BASIC METABOLIC PANEL
BUN: 15 mg/dL (ref 6–23)
CO2: 32 mEq/L (ref 19–32)
Calcium: 9.2 mg/dL (ref 8.4–10.5)
Chloride: 102 mEq/L (ref 96–112)
Creat: 0.94 mg/dL (ref 0.50–1.10)
Glucose, Bld: 118 mg/dL — ABNORMAL HIGH (ref 70–99)
POTASSIUM: 4.1 meq/L (ref 3.5–5.3)
SODIUM: 141 meq/L (ref 135–145)

## 2014-01-18 MED ORDER — OLMESARTAN MEDOXOMIL-HCTZ 40-12.5 MG PO TABS: 1.0000 | ORAL_TABLET | Freq: Every day | ORAL | Status: DC

## 2014-01-18 NOTE — Assessment & Plan Note (Signed)
BP stable on benicar HCT. Continue same, obtain bmet.

## 2014-01-18 NOTE — Assessment & Plan Note (Signed)
Stable on effexor.  continue same.  

## 2014-01-18 NOTE — Progress Notes (Signed)
Subjective:    [name], female    DOB: 1956/03/05, 58 y.o.   MRN: [id number]  HPI  Julie Gray is a 58 yr old female who presents today for follow up of HTN. She denies CP/SOB or swelling. She is having issues with her mail order refill and is requesting a short term refill for her local pharmacy in case she needs to fill a refill gap.  BP Readings from Last 3 Encounters:  01/18/14 100/70  10/11/13 98/65  09/30/13 116/72   Anxiety/depression- Per pt she is on effexor for both anxiety and depression and report that these symptoms are well controlled on effexor.  GERD- Pt is maintained on prilosec. well controlled on PPI.    Review of Systems    see HPI  Past Medical History  Diagnosis Date  . Osteoarthritis     knees  . Hypoglycemia   . Visual disturbance     visual flashes, referred to [facility] (Normal funduscopic exam)  . Allergic rhinitis   . Snoring 2008    negative sleep study of OSA  . Colon polyps   . Hot flashes   . Facial lesion 06/2010    removed from right side of face--precancerous  . GERD (gastroesophageal reflux disease)   . Hypertension   . Adenomatous colon polyp     History   Social History  . Marital Status: Married    Spouse Name: N/A    Number of Children: N/A  . Years of Education: N/A   Occupational History  . 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 Smokeless tobacco: Never Used  . Alcohol Use: No  . Drug Use: No  . Sexual Activity: Not on file   Other Topics Concern  . Not on file   Social History Narrative  . No narrative on file    Past Surgical History  Procedure Laterality Date  . Abdominal hysterectomy  2002  . Cervical fusion  06/15/09    right sided C6-C7 radiculopathy status post anterior cervical diskectomy and fusion at the C5-6 and C6-7 levels. Phylliss Bob, MD  . Carpal tunnel release  05/2010, 06/2010     bilateral--Dr Tamera Punt  . Colonoscopy      Family History  Problem Relation Age of Onset  . Stroke Mother   . Colon cancer Mother     No Known Allergies  Current Outpatient Prescriptions on File Prior to Visit  Medication Sig Dispense Refill  . aspirin 81 MG EC tablet Take 81 mg by mouth daily.        Marland Kitchen estradiol (CLIMARA - DOSED IN MG/24 HR) 0.025 mg/24hr patch Place 1 patch (0.025 mg total) onto the skin once a week.  12 patch  2  . fexofenadine (ALLEGRA) 180 MG tablet Take 180 mg by mouth at bedtime as needed.       . gabapentin (NEURONTIN) 300 MG capsule Take 1 capsule (300 mg total) by mouth 2 (two) times daily.  180 capsule  1  . Glucosamine-Chondroitin-Vit D3 1500-1200-800 MG-MG-UNIT PACK Take 1 tablet by mouth 2 (two) times daily.        . mometasone (NASONEX) 50 MCG/ACT nasal spray Place 2 sprays into both nostrils daily as needed.       Marland Kitchen omeprazole (PRILOSEC) 20 MG capsule Take 2 capsules (40 mg total) by mouth daily.  180 capsule  1  . PROBIOTIC  CAPS Digestive Advantage, Align or generic daily      . venlafaxine XR (EFFEXOR-XR) 75 MG 24 hr capsule Take 1 capsule (75 mg total) by mouth daily.  90 capsule  1  . Soy Isoflavones 40 MG TABS Take 1 tablet by mouth 2 (two) times daily.         No current facility-administered medications on file prior to visit.    BP 100/70  Pulse 81  Temp(Src) 98 F (36.7 C) (Oral)  Resp 16  Ht 5\' 4"  (1.626 m)  Wt 205 lb 1.3 oz (93.024 kg)  BMI 35.18 kg/m2  SpO2 97%    Objective:   Physical Exam  Constitutional: She is oriented to person, place, and time. She appears well-developed and well-nourished. No distress.  Cardiovascular: Normal rate and regular rhythm.   No murmur heard. Pulmonary/Chest: Breath sounds normal. No respiratory distress. She has no wheezes. She has no rales. She exhibits no tenderness.  Musculoskeletal: She exhibits no edema.  Neurological: She is alert and oriented to person, place, and time.    Psychiatric: She has a normal mood and affect. Her behavior is normal. Judgment and thought content normal.          Assessment & Plan:

## 2014-01-18 NOTE — Patient Instructions (Addendum)
Please complete lab work prior to leaving.  Follow up with Dr. Charlett Blake in 3 months.

## 2014-01-18 NOTE — Assessment & Plan Note (Signed)
Stable on PPI, continue same.  

## 2014-01-20 ENCOUNTER — Telehealth: Payer: Self-pay | Admitting: Family

## 2014-01-20 DIAGNOSIS — R739 Hyperglycemia, unspecified: Secondary | ICD-10-CM

## 2014-01-20 NOTE — Telephone Encounter (Signed)
Sugar is elevated.  I would like her to return for A1C to screen her for diabetes.

## 2014-01-21 NOTE — Telephone Encounter (Signed)
Yes, I still would like to see her A1C. That will let me know if her sugar is running elevated consistently, or if it was just because of what she had eaten that day.

## 2014-01-21 NOTE — Telephone Encounter (Signed)
Left message for pt to return my call.

## 2014-01-21 NOTE — Telephone Encounter (Signed)
Julie Gray, pt told me the day of her appt that she had just eaten a dessert prior to coming in to the office and was concerned if we checked her sugar that it would be elevated.  Do I still need to bring her in for a1c?  I feel like she will have reservations about returning based on our conversation at her last visit. Please advise.

## 2014-01-26 NOTE — Telephone Encounter (Signed)
Left message for pt to return my call.

## 2014-01-27 NOTE — Telephone Encounter (Signed)
Patient returned phone call. °

## 2014-01-28 NOTE — Telephone Encounter (Signed)
Notified pt and she states she will return to the lab next Friday, 02/04/14. Lab order entered.

## 2014-02-07 ENCOUNTER — Other Ambulatory Visit: Payer: Self-pay | Admitting: Obstetrics & Gynecology

## 2014-02-07 LAB — HEMOGLOBIN A1C
HEMOGLOBIN A1C: 5.2 % (ref <5.7)
Mean Plasma Glucose: 103 mg/dL (ref <117)

## 2014-02-08 ENCOUNTER — Encounter: Payer: Self-pay | Admitting: Family

## 2014-02-08 LAB — CYTOLOGY - PAP

## 2014-03-01 ENCOUNTER — Other Ambulatory Visit: Payer: Self-pay | Admitting: Family Medicine

## 2014-03-07 ENCOUNTER — Encounter: Payer: Self-pay | Admitting: Family Medicine

## 2014-03-07 ENCOUNTER — Ambulatory Visit (INDEPENDENT_AMBULATORY_CARE_PROVIDER_SITE_OTHER): Payer: 59 | Admitting: Family Medicine

## 2014-03-07 VITALS — BP 142/92 | HR 70 | Temp 98.6°F | Ht 64.0 in | Wt 207.8 lb

## 2014-03-07 DIAGNOSIS — K219 Gastro-esophageal reflux disease without esophagitis: Secondary | ICD-10-CM

## 2014-03-07 DIAGNOSIS — F341 Dysthymic disorder: Secondary | ICD-10-CM

## 2014-03-07 DIAGNOSIS — F32A Depression, unspecified: Secondary | ICD-10-CM

## 2014-03-07 DIAGNOSIS — D126 Benign neoplasm of colon, unspecified: Secondary | ICD-10-CM

## 2014-03-07 DIAGNOSIS — M25562 Pain in left knee: Secondary | ICD-10-CM

## 2014-03-07 DIAGNOSIS — I10 Essential (primary) hypertension: Secondary | ICD-10-CM

## 2014-03-07 DIAGNOSIS — E669 Obesity, unspecified: Secondary | ICD-10-CM

## 2014-03-07 DIAGNOSIS — Z0000 Encounter for general adult medical examination without abnormal findings: Secondary | ICD-10-CM | POA: Insufficient documentation

## 2014-03-07 DIAGNOSIS — E785 Hyperlipidemia, unspecified: Secondary | ICD-10-CM

## 2014-03-07 DIAGNOSIS — Z780 Asymptomatic menopausal state: Secondary | ICD-10-CM

## 2014-03-07 DIAGNOSIS — F419 Anxiety disorder, unspecified: Secondary | ICD-10-CM

## 2014-03-07 DIAGNOSIS — Z23 Encounter for immunization: Secondary | ICD-10-CM

## 2014-03-07 DIAGNOSIS — F329 Major depressive disorder, single episode, unspecified: Secondary | ICD-10-CM

## 2014-03-07 DIAGNOSIS — F418 Other specified anxiety disorders: Secondary | ICD-10-CM

## 2014-03-07 DIAGNOSIS — J309 Allergic rhinitis, unspecified: Secondary | ICD-10-CM

## 2014-03-07 DIAGNOSIS — M25569 Pain in unspecified knee: Secondary | ICD-10-CM | POA: Insufficient documentation

## 2014-03-07 HISTORY — DX: Encounter for general adult medical examination without abnormal findings: Z00.00

## 2014-03-07 LAB — URIC ACID: URIC ACID, SERUM: 5.4 mg/dL (ref 2.4–7.0)

## 2014-03-07 LAB — HEPATIC FUNCTION PANEL
ALBUMIN: 3.6 g/dL (ref 3.5–5.2)
ALT: 23 U/L (ref 0–35)
AST: 24 U/L (ref 0–37)
Alkaline Phosphatase: 75 U/L (ref 39–117)
Bilirubin, Direct: 0 mg/dL (ref 0.0–0.3)
TOTAL PROTEIN: 6.8 g/dL (ref 6.0–8.3)
Total Bilirubin: 0.3 mg/dL (ref 0.2–1.2)

## 2014-03-07 LAB — CBC
HCT: 39.5 % (ref 36.0–46.0)
HEMOGLOBIN: 13.4 g/dL (ref 12.0–15.0)
MCHC: 33.9 g/dL (ref 30.0–36.0)
MCV: 88 fl (ref 78.0–100.0)
PLATELETS: 187 10*3/uL (ref 150.0–400.0)
RBC: 4.49 Mil/uL (ref 3.87–5.11)
RDW: 12.7 % (ref 11.5–15.5)
WBC: 4.5 10*3/uL (ref 4.0–10.5)

## 2014-03-07 LAB — LIPID PANEL
Cholesterol: 199 mg/dL (ref 0–200)
HDL: 34.5 mg/dL — ABNORMAL LOW (ref >39.00)
LDL CALC: 129 mg/dL — AB (ref 0–99)
NonHDL: 164.5
Total CHOL/HDL Ratio: 6
Triglycerides: 178 mg/dL — ABNORMAL HIGH (ref 0.0–149.0)
VLDL: 35.6 mg/dL (ref 0.0–40.0)

## 2014-03-07 LAB — TSH: TSH: 1.51 u[IU]/mL (ref 0.35–4.50)

## 2014-03-07 MED ORDER — GABAPENTIN 300 MG PO CAPS: 300.0000 mg | ORAL_CAPSULE | Freq: Two times a day (BID) | ORAL | Status: DC

## 2014-03-07 MED ORDER — ESTRADIOL 0.025 MG/24HR TD PTWK: 0.0250 mg | MEDICATED_PATCH | TRANSDERMAL | Status: DC

## 2014-03-07 MED ORDER — OLMESARTAN MEDOXOMIL-HCTZ 40-12.5 MG PO TABS: 1.0000 | ORAL_TABLET | Freq: Every day | ORAL | Status: DC

## 2014-03-07 MED ORDER — OMEPRAZOLE 20 MG PO CPDR: 40.0000 mg | DELAYED_RELEASE_CAPSULE | Freq: Every day | ORAL | Status: DC

## 2014-03-07 MED ORDER — VENLAFAXINE HCL ER 75 MG PO CP24: 75.0000 mg | ORAL_CAPSULE | Freq: Every day | ORAL | Status: DC

## 2014-03-07 NOTE — Patient Instructions (Signed)

## 2014-03-07 NOTE — Assessment & Plan Note (Signed)
Well controlled, no changes to meds. Encouraged heart healthy diet such as the DASH diet and exercise as tolerated.  °

## 2014-03-07 NOTE — Assessment & Plan Note (Signed)
Colonoscopy UTD, asymptomatic

## 2014-03-07 NOTE — Progress Notes (Signed)
[name], female   DOB: 03/14/1956, 58 y.o.   MRN: [id number] Julie Gray [id number] 1956/04/18 03/07/2014      Progress Note-Follow Up  Subjective  Chief Complaint  Chief Complaint  Patient presents with  . Annual Exam    physical  . Injections    flu    HPI  Patient is a 58 year old female in today for routine medical care. She is in today for annual exam. Had generally been doing well until recent flare in left knee pain. She reports 2 weeks ago she had the sudden onset of swollen right painful knee. No trauma or injury. No other acute complaints. Denies CP/palp/SOB/HA/congestion/fevers/GI or GU c/o. Taking meds as prescribed  Past Medical History  Diagnosis Date  . Osteoarthritis     knees  . Hypoglycemia   . Visual disturbance     visual flashes, referred to [facility] (Normal funduscopic exam)  . Allergic rhinitis   . Snoring 2008    negative sleep study of OSA  . Colon polyps   . Hot flashes   . Facial lesion 06/2010    removed from right side of face--precancerous  . GERD (gastroesophageal reflux disease)   . Hypertension   . Adenomatous colon polyp     Past Surgical History  Procedure Laterality Date  . Abdominal hysterectomy  2002  . Cervical fusion  06/15/09    right sided C6-C7 radiculopathy status post anterior cervical diskectomy and fusion at the C5-6 and C6-7 levels. Phylliss Bob, MD  . Carpal tunnel release  05/2010, 06/2010    bilateral--Dr Tamera Punt  . Colonoscopy      Family History  Problem Relation Age of Onset  . Stroke Mother   . Colon cancer Mother     History   Social History  . Marital Status: Married    Spouse Name: N/A    Number of Children: N/A  . Years of Education: N/A   Occupational History  . 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 Smokeless tobacco: Never Used  . Alcohol Use: No  . Drug Use: No  . Sexual  Activity: Not on file   Other Topics Concern  . Not on file   Social History Narrative  . No narrative on file    Current Outpatient Prescriptions on File Prior to Visit  Medication Sig Dispense Refill  . aspirin 81 MG EC tablet Take 81 mg by mouth daily.        Marland Kitchen estradiol (CLIMARA - DOSED IN MG/24 HR) 0.025 mg/24hr patch Place 1 patch (0.025 mg total) onto the skin once a week.  12 patch  2  . fexofenadine (ALLEGRA) 180 MG tablet Take 180 mg by mouth at bedtime as needed.       . gabapentin (NEURONTIN) 300 MG capsule Take 1 capsule (300 mg total) by mouth 2 (two) times daily.  180 capsule  1  . Glucosamine-Chondroitin-Vit D3 1500-1200-800 MG-MG-UNIT PACK Take 1 tablet by mouth 2 (two) times daily.        . mometasone (NASONEX) 50 MCG/ACT nasal spray Place 2 sprays into both nostrils daily as needed.       Marland Kitchen olmesartan-hydrochlorothiazide (BENICAR HCT) 40-12.5 MG per tablet Take 1 tablet by mouth daily.  30 tablet  0  . omeprazole (PRILOSEC) 20 MG capsule TAKE 2 CAPSULES BY MOUTH (40MG  TOTAL) DAILY AS DIRECTED  180  capsule  0  . Soy Isoflavones 40 MG TABS Take 1 tablet by mouth 2 (two) times daily.        Marland Kitchen venlafaxine XR (EFFEXOR-XR) 75 MG 24 hr capsule TAKE 1 CAPSULE BY MOUTH DAILY  90 capsule  0   No current facility-administered medications on file prior to visit.    No Known Allergies  Review of Systems  Review of Systems  Constitutional: Negative for fever and malaise/fatigue.  HENT: Negative for congestion.   Eyes: Negative for discharge.  Respiratory: Negative for shortness of breath.   Cardiovascular: Negative for chest pain, palpitations and leg swelling.  Gastrointestinal: Negative for nausea, abdominal pain and diarrhea.  Genitourinary: Negative for dysuria.  Musculoskeletal: Negative for falls.  Skin: Negative for rash.  Neurological: Negative for loss of consciousness and headaches.  Endo/Heme/Allergies: Negative for polydipsia.  Psychiatric/Behavioral:  Negative for depression and suicidal ideas. The patient is not nervous/anxious and does not have insomnia.     Objective  BP 142/92  Pulse 70  Temp(Src) 98.6 F (37 C) (Oral)  Ht 5\' 4"  (1.626 m)  Wt 207 lb 12.8 oz (94.257 kg)  BMI 35.65 kg/m2  SpO2 99%  Physical Exam  Physical Exam  Constitutional: She is oriented to person, place, and time and well-developed, well-nourished, and in no distress. No distress.  HENT:  Head: Normocephalic and atraumatic.  Right Ear: External ear normal.  Left Ear: External ear normal.  Nose: Nose normal.  Mouth/Throat: Oropharynx is clear and moist. No oropharyngeal exudate.  Eyes: Conjunctivae are normal. Pupils are equal, round, and reactive to light. Right eye exhibits no discharge. Left eye exhibits no discharge. No scleral icterus.  Neck: Normal range of motion. Neck supple. No thyromegaly present.  Cardiovascular: Normal rate, regular rhythm, normal heart sounds and intact distal pulses.   No murmur heard. Pulmonary/Chest: Effort normal and breath sounds normal. No respiratory distress. She has no wheezes. She has no rales.  Abdominal: Soft. Bowel sounds are normal. She exhibits no distension and no mass. There is no tenderness.  Musculoskeletal: Normal range of motion. She exhibits no edema and no tenderness.  Lymphadenopathy:    She has no cervical adenopathy.  Neurological: She is alert and oriented to person, place, and time. She has normal reflexes. No cranial nerve deficit. Coordination normal.  Skin: Skin is warm and dry. No rash noted. She is not diaphoretic.  Psychiatric: Mood, memory and affect normal.    Lab Results  Component Value Date   TSH 1.91 10/10/2008   Lab Results  Component Value Date   WBC 5.9 06/08/2012   HGB 15.1* 06/08/2012   HCT 43.4 06/08/2012   MCV 85.6 06/08/2012   PLT 228 06/08/2012   Lab Results  Component Value Date   CREATININE 0.94 01/18/2014   BUN 15 01/18/2014   NA 141 01/18/2014   K 4.1  01/18/2014   CL 102 01/18/2014   CO2 32 01/18/2014   Lab Results  Component Value Date   ALT 18 12/09/2011   AST 20 12/09/2011   ALKPHOS 93 12/09/2011   BILITOT 0.4 12/09/2011   Lab Results  Component Value Date   CHOL 201* 12/09/2011   Lab Results  Component Value Date   HDL 44 12/09/2011   Lab Results  Component Value Date   LDLCALC 129* 12/09/2011   Lab Results  Component Value Date   TRIG 142 12/09/2011   Lab Results  Component Value Date   CHOLHDL 4.6 12/09/2011  Assessment & Plan  Obesity Encouraged DASH diet, decrease po intake and increase exercise as tolerated. Needs 7-8 hours of sleep nightly. Avoid trans fats, eat small, frequent meals every 4-5 hours with lean proteins, complex carbs and healthy fats. Minimize simple carbs  Anxiety and depression Doing well on Venlafaxine given refills on this today  COLONIC POLYPS Colonoscopy UTD, asymptomatic  Knee pain, acute Left knee about 2 weeks, responding to Diclofenac. Check uric acid level  Other and unspecified hyperlipidemia Encouraged heart healthy diet, increase exercise, avoid trans fats, consider a krill oil cap daily  GERD (gastroesophageal reflux disease) Avoid offending foods, start probiotics. Do not eat large meals in late evening and consider raising head of bed.   HYPERTENSION Well controlled, no changes to meds. Encouraged heart healthy diet such as the DASH diet and exercise as tolerated.   ALLERGIC RHINITIS Doing well no recent flares.   Preventative health care Patient encouraged to maintain heart healthy diet, regular exercise, adequate sleep. Consider daily probiotics. Take medications as prescribed. Given flu shot today  Post-menopause Given refill estrogen patch today

## 2014-03-07 NOTE — Assessment & Plan Note (Signed)
Doing well on Venlafaxine given refills on this today

## 2014-03-07 NOTE — Assessment & Plan Note (Signed)
Doing well no recent flares.  

## 2014-03-07 NOTE — Progress Notes (Signed)
Pre visit review using our clinic review tool, if applicable. No additional management support is needed unless otherwise documented below in the visit note. 

## 2014-03-07 NOTE — Assessment & Plan Note (Signed)
Patient encouraged to maintain heart healthy diet, regular exercise, adequate sleep. Consider daily probiotics. Take medications as prescribed. Given flu shot today

## 2014-03-07 NOTE — Assessment & Plan Note (Signed)
Encouraged DASH diet, decrease po intake and increase exercise as tolerated. Needs 7-8 hours of sleep nightly. Avoid trans fats, eat small, frequent meals every 4-5 hours with lean proteins, complex carbs and healthy fats. Minimize simple carbs 

## 2014-03-07 NOTE — Assessment & Plan Note (Signed)
Encouraged heart healthy diet, increase exercise, avoid trans fats, consider a krill oil cap daily 

## 2014-03-07 NOTE — Assessment & Plan Note (Addendum)
Left knee about 2 weeks, responding to Diclofenac. Check uric acid level

## 2014-03-07 NOTE — Assessment & Plan Note (Signed)
Given refill estrogen patch today

## 2014-03-07 NOTE — Assessment & Plan Note (Signed)
Avoid offending foods, start probiotics. Do not eat large meals in late evening and consider raising head of bed.  

## 2014-03-09 ENCOUNTER — Telehealth: Payer: Self-pay | Admitting: *Deleted

## 2014-03-09 NOTE — Telephone Encounter (Signed)
Notified pt. 

## 2014-03-09 NOTE — Telephone Encounter (Signed)
Notify uric acid normal all else normal except cholesterol is off some. Avoid trans fats and simple carbs, increas exercise and monitor

## 2014-03-09 NOTE — Telephone Encounter (Signed)
Left msg on [facility] tri[facility] requesting results back from her lab test esp. The uric acid wanting to value. MD not at this location forward msg to Dr. Randel Pigg...Johny Chess

## 2014-04-18 ENCOUNTER — Ambulatory Visit: Payer: 59 | Admitting: Family Medicine

## 2014-06-07 ENCOUNTER — Ambulatory Visit (INDEPENDENT_AMBULATORY_CARE_PROVIDER_SITE_OTHER): Payer: 59 | Admitting: Physician Assistant

## 2014-06-07 ENCOUNTER — Encounter: Payer: Self-pay | Admitting: Physician Assistant

## 2014-06-07 VITALS — BP 105/62 | HR 73 | Temp 98.5°F | Resp 16 | Ht 64.0 in | Wt 203.2 lb

## 2014-06-07 DIAGNOSIS — J209 Acute bronchitis, unspecified: Secondary | ICD-10-CM

## 2014-06-07 MED ORDER — DOXYCYCLINE MONOHYDRATE 100 MG PO CAPS: 100.0000 mg | ORAL_CAPSULE | Freq: Two times a day (BID) | ORAL | Status: DC

## 2014-06-07 NOTE — Progress Notes (Signed)
Pre visit review using our clinic review tool, if applicable. No additional management support is needed unless otherwise documented below in the visit note/SLS  

## 2014-06-07 NOTE — Patient Instructions (Signed)
Please take antibiotic as directed.  Increase fluid intake.  Rest.  Use Mucinex.  Place a humidifier in the bedroom.  Use Delsym for cough. Follow-up if symptoms are not improving.  Acute Bronchitis Bronchitis is inflammation of the airways that extend from the windpipe into the lungs (bronchi). The inflammation often causes mucus to develop. This leads to a cough, which is the most common symptom of bronchitis.  In acute bronchitis, the condition usually develops suddenly and goes away over time, usually in a couple weeks. Smoking, allergies, and asthma can make bronchitis worse. Repeated episodes of bronchitis may cause further lung problems.  CAUSES Acute bronchitis is most often caused by the same virus that causes a cold. The virus can spread from person to person (contagious) through coughing, sneezing, and touching contaminated objects. SIGNS AND SYMPTOMS   Cough.   Fever.   Coughing up mucus.   Body aches.   Chest congestion.   Chills.   Shortness of breath.   Sore throat.  DIAGNOSIS  Acute bronchitis is usually diagnosed through a physical exam. Your health care provider will also ask you questions about your medical history. Tests, such as chest X-rays, are sometimes done to rule out other conditions.  TREATMENT  Acute bronchitis usually goes away in a couple weeks. Oftentimes, no medical treatment is necessary. Medicines are sometimes given for relief of fever or cough. Antibiotic medicines are usually not needed but may be prescribed in certain situations. In some cases, an inhaler may be recommended to help reduce shortness of breath and control the cough. A cool mist vaporizer may also be used to help thin bronchial secretions and make it easier to clear the chest.  HOME CARE INSTRUCTIONS  Get plenty of rest.   Drink enough fluids to keep your urine clear or pale yellow (unless you have a medical condition that requires fluid restriction). Increasing fluids may  help thin your respiratory secretions (sputum) and reduce chest congestion, and it will prevent dehydration.   Take medicines only as directed by your health care provider.  If you were prescribed an antibiotic medicine, finish it all even if you start to feel better.  Avoid smoking and secondhand smoke. Exposure to cigarette smoke or irritating chemicals will make bronchitis worse. If you are a smoker, consider using nicotine gum or skin patches to help control withdrawal symptoms. Quitting smoking will help your lungs heal faster.   Reduce the chances of another bout of acute bronchitis by washing your hands frequently, avoiding people with cold symptoms, and trying not to touch your hands to your mouth, nose, or eyes.   Keep all follow-up visits as directed by your health care provider.  SEEK MEDICAL CARE IF: Your symptoms do not improve after 1 week of treatment.  SEEK IMMEDIATE MEDICAL CARE IF:  You develop an increased fever or chills.   You have chest pain.   You have severe shortness of breath.  You have bloody sputum.   You develop dehydration.  You faint or repeatedly feel like you are going to pass out.  You develop repeated vomiting.  You develop a severe headache. MAKE SURE YOU:   Understand these instructions.  Will watch your condition.  Will get help right away if you are not doing well or get worse. Document Released: 07/18/2004 Document Revised: 10/25/2013 Document Reviewed: 12/01/2012 [facility] Patient Information 2015 [place], [place]. This information is not intended to replace advice given to you by your health care provider. Make sure you discuss  any questions you have with your health care provider.  

## 2014-06-07 NOTE — Progress Notes (Signed)
  Subjective:     Julie Gray is a 58 y.o. female who presents for evaluation of symptoms of a URI. Symptoms include bilateral ear pressure/pain, achiness, congestion, nasal congestion, no  fever, productive cough with  green colored sputum, sinus pressure and wheezing. Onset of symptoms was 3 days ago, and has been rapidly worsening since that time. Treatment to date: none.  The following portions of the patient's history were reviewed and updated as appropriate: allergies, current medications, past family history, past medical history, past social history, past surgical history and problem list.  Review of Systems Pertinent items are noted in HPI.   Objective:    General appearance: alert, cooperative, appears stated age and no distress Head: Normocephalic, without obvious abnormality, atraumatic Eyes: conjunctivae/corneas clear. PERRL, EOM's intact. Fundi benign. Ears: normal TM's and external ear canals both ears Nose: Nares normal. Septum midline. Mucosa normal. No drainage or sinus tenderness. Throat: lips, mucosa, and tongue normal; teeth and gums normal Lungs: clear to auscultation bilaterally Heart: regular rate and rhythm, S1, S2 normal, no murmur, click, rub or gallop   Assessment:    bronchitis   Plan:    Discussed diagnosis and treatment of URI. Suggested symptomatic OTC remedies. Nasal saline spray for congestion and plain Mucinex. Doxycycline per orders. Follow up as needed.

## 2014-08-16 ENCOUNTER — Ambulatory Visit (INDEPENDENT_AMBULATORY_CARE_PROVIDER_SITE_OTHER): Payer: Managed Care, Other (non HMO) | Admitting: Physician Assistant

## 2014-08-16 ENCOUNTER — Encounter: Payer: Self-pay | Admitting: Physician Assistant

## 2014-08-16 VITALS — BP 104/53 | HR 86 | Temp 98.2°F | Resp 16 | Ht 64.0 in | Wt 203.1 lb

## 2014-08-16 DIAGNOSIS — J209 Acute bronchitis, unspecified: Secondary | ICD-10-CM | POA: Insufficient documentation

## 2014-08-16 DIAGNOSIS — N61 Inflammatory disorders of breast: Secondary | ICD-10-CM

## 2014-08-16 DIAGNOSIS — N6001 Solitary cyst of right breast: Secondary | ICD-10-CM

## 2014-08-16 DIAGNOSIS — N611 Abscess of the breast and nipple: Secondary | ICD-10-CM

## 2014-08-16 MED ORDER — DOXYCYCLINE MONOHYDRATE 100 MG PO CAPS: 100.0000 mg | ORAL_CAPSULE | Freq: Two times a day (BID) | ORAL | Status: DC

## 2014-08-16 NOTE — Patient Instructions (Addendum)
Please take the antibiotic as directed until all tablets are gone. I will call you with your results.   If symptoms are still present, we will need to let a general surgeon evaluate the lesion.  Follow-up in 1 week.

## 2014-08-16 NOTE — Assessment & Plan Note (Signed)
Area cleaned with betadine.  1% Lidocaine with epinephrine used to numb area. 18 gauge needle used to aspirate purulent fluid from lesion.  Sent for culture. Scant amount of purulent drainage removed. Lesion not fluctuant enough for successful I/D.  Bandage applied. Rx doxycycline for cellulitis.  Strict return precautions given. If still persistent, will refer to Gen Surgery.

## 2014-08-16 NOTE — Progress Notes (Signed)
Patient presents to clinic today c/o cellulitis and abscess of R breat x 1.5 weeks.  Endorses tenderness of area with redness but denies drainage.  Denies fever, chills, malaise or fatigue.  Had similar episode at exact same location one year ago.  Mammography and [place] at time confirmed infection as culprit.  Past Medical History  Diagnosis Date  . Osteoarthritis     knees  . Hypoglycemia   . Visual disturbance     visual flashes, referred to [facility] (Normal funduscopic exam)  . Allergic rhinitis   . Snoring 2008    negative sleep study of OSA  . Colon polyps   . Hot flashes   . Facial lesion 06/2010    removed from right side of face--precancerous  . GERD (gastroesophageal reflux disease)   . Hypertension   . Adenomatous colon polyp   . Preventative health care 03/07/2014    Current Outpatient Prescriptions on File Prior to Visit  Medication Sig Dispense Refill  . aspirin 81 MG EC tablet Take 81 mg by mouth daily.      Marland Kitchen estradiol (CLIMARA - DOSED IN MG/24 HR) 0.025 mg/24hr patch Place 1 patch (0.025 mg total) onto the skin once a week. 12 patch 2  . fexofenadine (ALLEGRA) 180 MG tablet Take 180 mg by mouth at bedtime as needed.     . gabapentin (NEURONTIN) 300 MG capsule Take 1 capsule (300 mg total) by mouth 2 (two) times daily. 180 capsule 2  . Glucosamine-Chondroitin-Vit D3 1500-1200-800 MG-MG-UNIT PACK Take 1 tablet by mouth 2 (two) times daily.      . mometasone (NASONEX) 50 MCG/ACT nasal spray Place 2 sprays into both nostrils daily as needed.     Marland Kitchen olmesartan-hydrochlorothiazide (BENICAR HCT) 40-12.5 MG per tablet Take 1 tablet by mouth daily. 90 tablet 2  . omeprazole (PRILOSEC) 20 MG capsule Take 2 capsules (40 mg total) by mouth daily. 180 capsule 2  . Soy Isoflavones 40 MG TABS Take 1 tablet by mouth 2 (two) times daily.      Marland Kitchen venlafaxine XR (EFFEXOR-XR) 75 MG 24 hr capsule Take 1 capsule (75 mg total) by mouth daily with breakfast. 90 capsule 2   No  current facility-administered medications on file prior to visit.    No Known Allergies  Family History  Problem Relation Age of Onset  . Stroke Mother   . Colon cancer Mother   . Cancer Mother     colon  . Heart disease Mother     MI 53  . Arthritis Mother     OA and rheumatoid  . Hypertension Mother   . Hyperlipidemia Mother   . Alcohol abuse Father   . Heart disease Father   . Hypertension Father   . Arthritis Maternal Grandmother     RA and OA  . Heart disease Maternal Grandmother   . Hypertension Maternal Grandmother   . Diabetes Maternal Grandmother   . Heart disease Maternal Grandfather   . Hypertension Maternal Grandfather   . Diabetes Paternal Grandfather     History   Social History  . Marital Status: Married    Spouse Name: N/A  . Number of Children: N/A  . Years of Education: N/A   Occupational History  . 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 Smokeless tobacco: Never Used  . Alcohol Use: No  . Drug Use: No  . Sexual Activity:  No     Comment: lives with husband, no dietary restrictions   Other Topics Concern  . None   Social History Narrative   Review of Systems - See HPI.  All other ROS are negative.  BP 104/53 mmHg  Pulse 86  Temp(Src) 98.2 F (36.8 C) (Oral)  Resp 16  Ht 5\' 4"  (1.626 m)  Wt 203 lb 2 oz (92.137 kg)  BMI 34.85 kg/m2  SpO2 99%  Physical Exam  Cardiovascular: Normal rate, regular rhythm, normal heart sounds and intact distal pulses.   Pulmonary/Chest: Effort normal and breath sounds normal. No respiratory distress. She has no wheezes. She has no rales. She exhibits no tenderness.  Skin: Skin is warm and dry.     Vitals reviewed.   Assessment/Plan: Breast abscess of female Area cleaned with betadine.  1% Lidocaine with epinephrine used to numb area. 18 gauge needle used to aspirate purulent fluid from lesion.  Sent for culture. Scant  amount of purulent drainage removed. Lesion not fluctuant enough for successful I/D.  Bandage applied. Rx doxycycline for cellulitis.  Strict return precautions given. If still persistent, will refer to Gen Surgery.

## 2014-08-16 NOTE — Progress Notes (Signed)
Pre visit review using our clinic review tool, if applicable. No additional management support is needed unless otherwise documented below in the visit note/SLS  

## 2014-08-22 ENCOUNTER — Telehealth: Payer: Self-pay | Admitting: Family Medicine

## 2014-08-22 NOTE — Telephone Encounter (Signed)
Per Shanon Brow [lab],, the results have been faxed over, informed Janett Billow to please forward to me when she comes across this in her papers/SLS

## 2014-08-22 NOTE — Telephone Encounter (Signed)
Referral placed to general surgery. I need to know the results in order to make a decision regarding antibiotic changes.  Will you have Dr. B or Dr. Darene Lamer take a look to see if ABX need to be changed?

## 2014-08-22 NOTE — Telephone Encounter (Signed)
No result in EMR, having lab research, Path Dermatology order was to be changed to Wound Culture for this test/SLS

## 2014-08-22 NOTE — Telephone Encounter (Signed)
Cant find results on my chart please call with results

## 2014-08-22 NOTE — Telephone Encounter (Signed)
Received report from lab and had Dr. Birdie Riddle review and give instructions for patient, pt informed understood and reports that lesion is still swollen & has fluid and tenderness; per 02.23.16 AVS provider states he would refer to General Surgeon if symptoms were still present/SLS Please Advise.

## 2014-08-22 NOTE — Telephone Encounter (Signed)
Dr. Birdie Riddle did that before I sent you the message and called the patient, see previous note, she said "covered by [employer], no need for changes"/SLS

## 2014-09-02 ENCOUNTER — Other Ambulatory Visit (INDEPENDENT_AMBULATORY_CARE_PROVIDER_SITE_OTHER): Payer: Managed Care, Other (non HMO)

## 2014-09-02 DIAGNOSIS — I10 Essential (primary) hypertension: Secondary | ICD-10-CM

## 2014-09-02 DIAGNOSIS — E785 Hyperlipidemia, unspecified: Secondary | ICD-10-CM

## 2014-09-02 DIAGNOSIS — M109 Gout, unspecified: Secondary | ICD-10-CM

## 2014-09-02 LAB — CBC WITH DIFFERENTIAL/PLATELET
BASOS ABS: 0 10*3/uL (ref 0.0–0.1)
BASOS PCT: 0.4 % (ref 0.0–3.0)
Eosinophils Absolute: 0.1 10*3/uL (ref 0.0–0.7)
Eosinophils Relative: 3.4 % (ref 0.0–5.0)
HEMATOCRIT: 40.8 % (ref 36.0–46.0)
HEMOGLOBIN: 13.9 g/dL (ref 12.0–15.0)
LYMPHS PCT: 34.7 % (ref 12.0–46.0)
Lymphs Abs: 1.3 10*3/uL (ref 0.7–4.0)
MCHC: 34.1 g/dL (ref 30.0–36.0)
MCV: 86.7 fl (ref 78.0–100.0)
Monocytes Absolute: 0.3 10*3/uL (ref 0.1–1.0)
Monocytes Relative: 7.3 % (ref 3.0–12.0)
Neutro Abs: 2 10*3/uL (ref 1.4–7.7)
Neutrophils Relative %: 54.2 % (ref 43.0–77.0)
PLATELETS: 190 10*3/uL (ref 150.0–400.0)
RBC: 4.71 Mil/uL (ref 3.87–5.11)
RDW: 12.6 % (ref 11.5–15.5)
WBC: 3.7 10*3/uL — AB (ref 4.0–10.5)

## 2014-09-02 LAB — LIPID PANEL
Cholesterol: 203 mg/dL — ABNORMAL HIGH (ref 0–200)
HDL: 44.8 mg/dL (ref >39.00)
LDL Cholesterol: 124 mg/dL — ABNORMAL HIGH (ref 0–99)
NONHDL: 158.2
Total CHOL/HDL Ratio: 5
Triglycerides: 173 mg/dL — ABNORMAL HIGH (ref 0.0–149.0)
VLDL: 34.6 mg/dL (ref 0.0–40.0)

## 2014-09-02 LAB — RENAL FUNCTION PANEL
Albumin: 4.1 g/dL (ref 3.5–5.2)
BUN: 13 mg/dL (ref 6–23)
CO2: 27 mEq/L (ref 19–32)
Calcium: 9.3 mg/dL (ref 8.4–10.5)
Chloride: 105 mEq/L (ref 96–112)
Creatinine, Ser: 0.89 mg/dL (ref 0.40–1.20)
GFR: 69.07 mL/min (ref >60.00)
GLUCOSE: 91 mg/dL (ref 70–99)
PHOSPHORUS: 3.3 mg/dL (ref 2.3–4.6)
Potassium: 3.7 mEq/L (ref 3.5–5.1)
SODIUM: 140 meq/L (ref 135–145)

## 2014-09-02 LAB — TSH: TSH: 1.28 u[IU]/mL (ref 0.35–4.50)

## 2014-09-02 LAB — HEPATIC FUNCTION PANEL
ALT: 14 U/L (ref 0–35)
AST: 16 U/L (ref 0–37)
Albumin: 4.1 g/dL (ref 3.5–5.2)
Alkaline Phosphatase: 87 U/L (ref 39–117)
BILIRUBIN TOTAL: 0.4 mg/dL (ref 0.2–1.2)
Bilirubin, Direct: 0.1 mg/dL (ref 0.0–0.3)
Total Protein: 6.9 g/dL (ref 6.0–8.3)

## 2014-09-02 LAB — URIC ACID: URIC ACID, SERUM: 4.9 mg/dL (ref 2.4–7.0)

## 2014-09-09 ENCOUNTER — Ambulatory Visit (INDEPENDENT_AMBULATORY_CARE_PROVIDER_SITE_OTHER): Payer: Managed Care, Other (non HMO) | Admitting: Family Medicine

## 2014-09-09 ENCOUNTER — Encounter: Payer: Self-pay | Admitting: Family Medicine

## 2014-09-09 VITALS — BP 102/72 | HR 81 | Temp 98.3°F | Ht 64.5 in | Wt 201.4 lb

## 2014-09-09 DIAGNOSIS — Z780 Asymptomatic menopausal state: Secondary | ICD-10-CM

## 2014-09-09 DIAGNOSIS — E782 Mixed hyperlipidemia: Secondary | ICD-10-CM | POA: Diagnosis not present

## 2014-09-09 DIAGNOSIS — E669 Obesity, unspecified: Secondary | ICD-10-CM

## 2014-09-09 DIAGNOSIS — N611 Abscess of the breast and nipple: Secondary | ICD-10-CM

## 2014-09-09 DIAGNOSIS — Z0000 Encounter for general adult medical examination without abnormal findings: Secondary | ICD-10-CM | POA: Diagnosis not present

## 2014-09-09 DIAGNOSIS — L989 Disorder of the skin and subcutaneous tissue, unspecified: Secondary | ICD-10-CM

## 2014-09-09 DIAGNOSIS — N61 Inflammatory disorders of breast: Secondary | ICD-10-CM

## 2014-09-09 NOTE — Progress Notes (Signed)
Pre visit review using our clinic review tool, if applicable. No additional management support is needed unless otherwise documented below in the visit note. 

## 2014-09-09 NOTE — Patient Instructions (Addendum)
Krill oil, MegaRed daily by [employer], freeze will help Probiotic Digestive Advantage or [place]  Cholesterol Cholesterol is a white, waxy, fat-like substance needed by your body in small amounts. The liver makes all the cholesterol you need. Cholesterol is carried from the liver by the blood through the blood vessels. Deposits of cholesterol (plaque) may build up on blood vessel walls. These make the arteries narrower and stiffer. Cholesterol plaques increase the risk for heart attack and stroke.  You cannot feel your cholesterol level even if it is very high. The only way to know it is high is with a blood test. Once you know your cholesterol levels, you should keep a record of the test results. Work with your health care provider to keep your levels in the desired range.  WHAT DO THE RESULTS MEAN?  Total cholesterol is a rough measure of all the cholesterol in your   .   LDL is the so-called bad cholesterol. This is the type that deposits cholesterol in the walls of the arteries. You want this level to be low.   HDL is the good cholesterol because it cleans the arteries and carries the LDL away. You want this level to be high.  Triglycerides are fat that the body can either burn for energy or store. High levels are closely linked to heart disease.  WHAT ARE THE DESIRED LEVELS OF CHOLESTEROL?  Total cholesterol below 200.   LDL below 100 for people at risk, below 70 for those at very high risk.   HDL above 50 is good, above 60 is best.   Triglycerides below 150.  HOW CAN I LOWER MY CHOLESTEROL?  Diet. Follow your diet programs as directed by your health care provider.   Choose fish or white meat chicken and [place], roasted or baked. Limit fatty cuts of red meat, fried foods, and processed meats, such as sausage and lunch meats.   Eat lots of fresh fruits and vegetables.  Choose whole grains, beans, pasta, potatoes, and cereals.   Use only small amounts of  olive, corn, or canola oils.   Avoid butter, mayonnaise, shortening, or palm kernel oils.  Avoid foods with trans fats.   Drink skim or nonfat milk and eat low-fat or nonfat yogurt and cheeses. Avoid whole milk, cream, ice cream, egg yolks, and full-fat cheeses.   Healthy desserts include angel food cake, ginger snaps, animal crackers, hard candy, popsicles, and low-fat or nonfat frozen yogurt. Avoid pastries, cakes, pies, and cookies.   Exercise. Follow your exercise programs as directed by your health care provider.   A regular program helps decrease LDL and raise HDL.   A regular program helps with weight control.   Do things that increase your activity level like gardening, walking, or taking the stairs. Ask your health care provider about how you can be more active in your daily life.   Medicine. Take medicine only as directed by your health care provider.   Medicine may be prescribed by your health care provider to help lower cholesterol and decrease the risk for heart disease.   If you have several risk factors, you may need medicine even if your levels are normal. Document Released: 03/05/2001 Document Revised: 10/25/2013 Document Reviewed: 03/24/2013 [facility] Patient Information 2015 [place], [place]. This information is not intended to replace advice given to you by your health care provider. Make sure you discuss any questions you have with your health care provider.

## 2014-09-22 ENCOUNTER — Encounter: Payer: Self-pay | Admitting: Family Medicine

## 2014-09-22 DIAGNOSIS — N611 Abscess of the breast and nipple: Secondary | ICD-10-CM | POA: Insufficient documentation

## 2014-09-22 HISTORY — DX: Abscess of the breast and nipple: N61.1

## 2014-09-22 NOTE — Assessment & Plan Note (Signed)
Recurrent. Referred to general surgery for further consideration

## 2014-09-22 NOTE — Assessment & Plan Note (Signed)
Encouraged DASH diet, decrease po intake and increase exercise as tolerated. Needs 7-8 hours of sleep nightly. Avoid trans fats, eat small, frequent meals every 4-5 hours with lean proteins, complex carbs and healthy fats. Minimize simple carbs, GMO foods. 

## 2014-09-22 NOTE — Progress Notes (Signed)
Julie Gray  [id number] 1956-05-11 09/22/2014      Progress Note-Follow Up  Subjective  Chief Complaint  Chief Complaint  Patient presents with  . Follow-up    HPI  Patient is a 59 y.o. female in today for routine medical care. Patient is in today for follow-up. Continues to have trouble with recurrent abscess on her right breast. It is minimally uncomfortable today and no warmth but has had issues with swelling and warmth recurrently. Partially response to antibiotics only to recur. No other acute complaints. Had surgery with [facility] in February and has finished a course of physical therapy and feels well. Has her follow-up appointment with Dr. Grandville Silos in April. Denies CP/palp/SOB/HA/congestion/fevers/GI or GU c/o. Taking meds as prescribed  Past Medical History  Diagnosis Date  . Osteoarthritis     knees  . Hypoglycemia   . Visual disturbance     visual flashes, referred to [facility] (Normal funduscopic exam)  . Allergic rhinitis   . Snoring 2008    negative sleep study of OSA  . Colon polyps   . Hot flashes   . Facial lesion 06/2010    removed from right side of face--precancerous  . GERD (gastroesophageal reflux disease)   . Hypertension   . Adenomatous colon polyp   . Preventative health care 03/07/2014  . Breast abscess 09/22/2014    recurrent    Past Surgical History  Procedure Laterality Date  . Abdominal hysterectomy  2002  . Cervical fusion  06/15/09    right sided C6-C7 radiculopathy status post anterior cervical diskectomy and fusion at the C5-6 and C6-7 levels. Phylliss Bob, MD  . Carpal tunnel release  05/2010, 06/2010    bilateral--Dr Tamera Punt  . Colonoscopy      Family History  Problem Relation Age of Onset  . Stroke Mother   . Colon cancer Mother   . Cancer Mother     colon  . Heart disease Mother     MI 73  . Arthritis Mother     OA and rheumatoid  . Hypertension Mother   . Hyperlipidemia Mother   . Alcohol  abuse Father   . Heart disease Father   . Hypertension Father   . Arthritis Maternal Grandmother     RA and OA  . Heart disease Maternal Grandmother   . Hypertension Maternal Grandmother   . Diabetes Maternal Grandmother   . Heart disease Maternal Grandfather   . Hypertension Maternal Grandfather   . Diabetes Paternal Grandfather     History   Social History  . Marital Status: Married    Spouse Name: N/A  . Number of Children: N/A  . Years of Education: N/A   Occupational History  . 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 Smokeless tobacco: Never Used  . Alcohol Use: No  . Drug Use: No  . Sexual Activity: No     Comment: lives with husband, no dietary restrictions   Other Topics Concern  . Not on file   Social History Narrative    Current Outpatient Prescriptions on File Prior to Visit  Medication Sig Dispense Refill  . aspirin 81 MG EC tablet Take 81 mg by mouth daily.      Marland Kitchen estradiol (CLIMARA - DOSED IN MG/24 HR) 0.025 mg/24hr patch Place 1 patch (0.025 mg total) onto the skin once a week. 12 patch 2  . fexofenadine (ALLEGRA)  180 MG tablet Take 180 mg by mouth at bedtime as needed.     . gabapentin (NEURONTIN) 300 MG capsule Take 1 capsule (300 mg total) by mouth 2 (two) times daily. 180 capsule 2  . Glucosamine-Chondroitin-Vit D3 1500-1200-800 MG-MG-UNIT PACK Take 1 tablet by mouth 2 (two) times daily.      . mometasone (NASONEX) 50 MCG/ACT nasal spray Place 2 sprays into both nostrils daily as needed.     Marland Kitchen olmesartan-hydrochlorothiazide (BENICAR HCT) 40-12.5 MG per tablet Take 1 tablet by mouth daily. 90 tablet 2  . omeprazole (PRILOSEC) 20 MG capsule Take 2 capsules (40 mg total) by mouth daily. 180 capsule 2  . Soy Isoflavones 40 MG TABS Take 1 tablet by mouth 2 (two) times daily.      Marland Kitchen venlafaxine XR (EFFEXOR-XR) 75 MG 24 hr capsule Take 1 capsule (75 mg total) by mouth daily with  breakfast. 90 capsule 2   No current facility-administered medications on file prior to visit.    No Known Allergies  Review of Systems  Review of Systems  Constitutional: Negative for fever and malaise/fatigue.  HENT: Negative for congestion.   Eyes: Negative for discharge.  Respiratory: Negative for shortness of breath.   Cardiovascular: Negative for chest pain, palpitations and leg swelling.  Gastrointestinal: Negative for nausea, abdominal pain and diarrhea.  Genitourinary: Negative for dysuria.  Musculoskeletal: Negative for falls.  Skin: Negative for rash.  Neurological: Negative for loss of consciousness and headaches.  Endo/Heme/Allergies: Negative for polydipsia.  Psychiatric/Behavioral: Negative for depression and suicidal ideas. The patient is not nervous/anxious and does not have insomnia.     Objective  BP 102/72 mmHg  Pulse 81  Temp(Src) 98.3 F (36.8 C) (Oral)  Ht 5' 4.5" (1.638 m)  Wt 201 lb 6 oz (91.343 kg)  BMI 34.04 kg/m2  SpO2 93%  Physical Exam  Physical Exam  Constitutional: She is oriented to person, place, and time and well-developed, well-nourished, and in no distress. No distress.  HENT:  Head: Normocephalic and atraumatic.  Eyes: Conjunctivae are normal.  Neck: Neck supple. No thyromegaly present.  Cardiovascular: Normal rate, regular rhythm and normal heart sounds.   No murmur heard. Pulmonary/Chest: Effort normal and breath sounds normal. She has no wheezes.  Abdominal: She exhibits no distension and no mass.  Genitourinary:  Swollen scar on right breast, no fluctuance or pustule  Musculoskeletal: She exhibits no edema.  Lymphadenopathy:    She has no cervical adenopathy.  Neurological: She is alert and oriented to person, place, and time.  Skin: Skin is warm and dry. No rash noted. She is not diaphoretic.  Psychiatric: Memory, affect and judgment normal.    Lab Results  Component Value Date   TSH 1.28 09/02/2014   Lab Results    Component Value Date   WBC 3.7* 09/02/2014   HGB 13.9 09/02/2014   HCT 40.8 09/02/2014   MCV 86.7 09/02/2014   PLT 190.0 09/02/2014   Lab Results  Component Value Date   CREATININE 0.89 09/02/2014   BUN 13 09/02/2014   NA 140 09/02/2014   K 3.7 09/02/2014   CL 105 09/02/2014   CO2 27 09/02/2014   Lab Results  Component Value Date   ALT 14 09/02/2014   AST 16 09/02/2014   ALKPHOS 87 09/02/2014   BILITOT 0.4 09/02/2014   Lab Results  Component Value Date   CHOL 203* 09/02/2014   Lab Results  Component Value Date   HDL 44.80 09/02/2014   Lab  Results  Component Value Date   LDLCALC 124* 09/02/2014   Lab Results  Component Value Date   TRIG 173.0* 09/02/2014   Lab Results  Component Value Date   CHOLHDL 5 09/02/2014     Assessment & Plan  Obesity Encouraged DASH diet, decrease po intake and increase exercise as tolerated. Needs 7-8 hours of sleep nightly. Avoid trans fats, eat small, frequent meals every 4-5 hours with lean proteins, complex carbs and healthy fats. Minimize simple carbs, GMO foods.   Hyperlipidemia, mixed Encouraged heart healthy diet, increase exercise, avoid trans fats, consider a krill oil cap daily   Breast abscess Recurrent. Referred to general surgery for further consideration

## 2014-09-22 NOTE — Assessment & Plan Note (Signed)
Encouraged heart healthy diet, increase exercise, avoid trans fats, consider a krill oil cap daily 

## 2014-10-19 ENCOUNTER — Other Ambulatory Visit: Payer: Self-pay | Admitting: General Surgery

## 2014-10-31 ENCOUNTER — Ambulatory Visit: Admit: 2014-10-31 | Payer: Managed Care, Other (non HMO) | Admitting: General Surgery

## 2014-10-31 SURGERY — EXCISION, CYST, BREAST
Anesthesia: LOCAL | Site: Breast | Laterality: Right

## 2014-12-01 ENCOUNTER — Other Ambulatory Visit: Payer: Self-pay | Admitting: Family Medicine

## 2014-12-01 DIAGNOSIS — Z780 Asymptomatic menopausal state: Secondary | ICD-10-CM

## 2014-12-01 DIAGNOSIS — Z23 Encounter for immunization: Secondary | ICD-10-CM

## 2014-12-01 DIAGNOSIS — I10 Essential (primary) hypertension: Secondary | ICD-10-CM

## 2014-12-01 DIAGNOSIS — M25562 Pain in left knee: Secondary | ICD-10-CM

## 2014-12-01 DIAGNOSIS — M791 Myalgia, unspecified site: Secondary | ICD-10-CM

## 2014-12-01 MED ORDER — OLMESARTAN MEDOXOMIL-HCTZ 40-12.5 MG PO TABS: 1.0000 | ORAL_TABLET | Freq: Every day | ORAL | Status: DC

## 2014-12-01 MED ORDER — GABAPENTIN 300 MG PO CAPS: 300.0000 mg | ORAL_CAPSULE | Freq: Two times a day (BID) | ORAL | Status: DC

## 2014-12-06 ENCOUNTER — Telehealth: Payer: Self-pay | Admitting: Family Medicine

## 2014-12-06 DIAGNOSIS — I10 Essential (primary) hypertension: Secondary | ICD-10-CM

## 2014-12-06 DIAGNOSIS — M791 Myalgia, unspecified site: Secondary | ICD-10-CM

## 2014-12-06 DIAGNOSIS — Z780 Asymptomatic menopausal state: Secondary | ICD-10-CM

## 2014-12-06 DIAGNOSIS — Z23 Encounter for immunization: Secondary | ICD-10-CM

## 2014-12-06 DIAGNOSIS — M25562 Pain in left knee: Secondary | ICD-10-CM

## 2014-12-06 MED ORDER — GABAPENTIN 300 MG PO CAPS: 300.0000 mg | ORAL_CAPSULE | Freq: Two times a day (BID) | ORAL | Status: DC

## 2014-12-06 MED ORDER — OLMESARTAN MEDOXOMIL-HCTZ 40-12.5 MG PO TABS: 1.0000 | ORAL_TABLET | Freq: Every day | ORAL | Status: DC

## 2014-12-06 NOTE — Telephone Encounter (Signed)
Caller name: Saylor Murry. Relation to pt: Self Call back number: [phone] Pharmacy:[facility] ([phone])Pharmacist    Reason for call: Pt called to inform that her rx gabapentin (NEURONTIN) 300 MG capsule and olmesartan-hydrochlorothiazide (BENICAR HCT) 40-12.5 MG per tablet, [facility]. Pt is needing rx sent to [facility] for [facility], pt states [facility] is going to communicate with [place] to have rx sent to them. Pt informed [facility] not [facility] so that they can be canceled. Please advise.

## 2014-12-06 NOTE — Telephone Encounter (Signed)
Called [facility] [place] [place] to [place] both Gabapentin and Benicar HCT.  Called the patient back to inform prescriptions have been canceled at local pharmacy and sent both gabapentin and Benicar HCT into [facility] for her.

## 2014-12-19 ENCOUNTER — Encounter: Payer: Self-pay | Admitting: Gastroenterology

## 2015-01-10 ENCOUNTER — Other Ambulatory Visit: Payer: Self-pay | Admitting: Orthopedic Surgery

## 2015-02-01 NOTE — H&P (Signed)
Julie Gray is an 59 y.o. female.   Chief Complaint: left wrist pain History: This patient returns for reevaluation, indicating that she continues to have fairly significant levels of left wrist radial sided pain despite all nonoperative measures to include splinting, injections, oral medicines, and stretching.  The prednisone Dosepak was not very helpful, and she has even stopped taking NSAIDs as she didn't find a difference on versus off.  She also describes a burning sensation over the first dorsal compartment and even dorsally over the distal radius with certain wrist motions.  Past Medical History  Diagnosis Date  . Osteoarthritis     knees  . Hypoglycemia   . Visual disturbance     visual flashes, referred to [facility] (Normal funduscopic exam)  . Allergic rhinitis   . Snoring 2008    negative sleep study of OSA  . Colon polyps   . Hot flashes   . Facial lesion 06/2010    removed from right side of face--precancerous  . GERD (gastroesophageal reflux disease)   . Hypertension   . Adenomatous colon polyp   . Preventative health care 03/07/2014  . Breast abscess 09/22/2014    recurrent    Past Surgical History  Procedure Laterality Date  . Abdominal hysterectomy  2002  . Cervical fusion  06/15/09    right sided C6-C7 radiculopathy status post anterior cervical diskectomy and fusion at the C5-6 and C6-7 levels. Phylliss Bob, MD  . Carpal tunnel release  05/2010, 06/2010    bilateral--Dr Tamera Punt  . Colonoscopy      Family History  Problem Relation Age of Onset  . Stroke Mother   . Colon cancer Mother   . Cancer Mother     colon  . Heart disease Mother     MI 33  . Arthritis Mother     OA and rheumatoid  . Hypertension Mother   . Hyperlipidemia Mother   . Alcohol abuse Father   . Heart disease Father   . Hypertension Father   . Arthritis Maternal Grandmother     RA and OA  . Heart disease Maternal Grandmother   . Hypertension Maternal Grandmother    . Diabetes Maternal Grandmother   . Heart disease Maternal Grandfather   . Hypertension Maternal Grandfather   . Diabetes Paternal Grandfather    Social History:  reports that she has never smoked. She has never used smokeless tobacco. She reports that she does not drink alcohol or use illicit drugs.  Allergies: No Known Allergies  No prescriptions prior to admission    No results found for this or any previous visit (from the past 48 hour(s)). No results found.  Review of Systems  All other systems reviewed and are negative.   There were no vitals taken for this visit. Physical Exam  Constitutional:  WD, WN, NAD HEENT:  NCAT, EOMI Neuro/Psych:  Alert & oriented to person, place, and time; appropriate mood & affect Lymphatic: No generalized UE edema or lymphadenopathy Extremities / MSK:  Both UE are normal with respect to appearance, ranges of motion, joint stability, muscle strength/tone, sensation, & perfusion except as otherwise noted:  Incision is healed and maturing.  She has full digital motion.  Wrist flexion 60, wrist extension 58, full pronation, supination 80.  Tenderness to palpation over the first dorsal compartment tendons.  The hardware is not clearly palpable.  Labs / X-rays:  None today. 4 views of the left wrist including inclined lateral were ordered and obtained  11/01/2014 reveal near anatomical alignment of the fracture as well as increased osseous healing.  The radial styloid peg may protrude from the cortex slightly on one view.  Assessment:  1. Status post left ORIF for distal radius fracture, likely with distal hardware now outside the confines of the distal radius 2. Left de Quervain's tenosynovitis  Plan: The findings were discussed with the patient.  At this point, all other measures have been exhausted and the fracture seems sufficiently healed to proceed with a de Quervain's release, possible tendon debridement, and removal of all the distal pegs  from the skeletal dynamics plate.  She would like to proceed on Monday, August 15.  Arrangements will be made for this.    The details of the operative procedure were discussed with the patient.  Questions were invited and answered.  In addition to the goal of the procedure, the risks of the procedure to include but not limited to bleeding; infection; damage to the nerves or blood vessels that could result in bleeding, numbness, weakness, chronic pain, and the need for additional procedures; stiffness; the need for revision surgery; and anesthetic risks, were reviewed.  No specific outcome was guaranteed or implied.  Informed consent was obtained.  Jahvon Gosline A. 02/01/2015, 12:12 PM

## 2015-02-03 ENCOUNTER — Encounter (HOSPITAL_BASED_OUTPATIENT_CLINIC_OR_DEPARTMENT_OTHER): Payer: Self-pay | Admitting: *Deleted

## 2015-02-03 NOTE — Progress Notes (Signed)
[place] Pt stated she did not want any lab work or EKG done. Bring all medications.

## 2015-02-06 ENCOUNTER — Encounter (HOSPITAL_BASED_OUTPATIENT_CLINIC_OR_DEPARTMENT_OTHER)
Admission: RE | Disposition: A | Discharge status: Home / Self Care | Payer: Self-pay | Source: Ambulatory Visit | Attending: Orthopedic Surgery

## 2015-02-06 ENCOUNTER — Ambulatory Visit (HOSPITAL_BASED_OUTPATIENT_CLINIC_OR_DEPARTMENT_OTHER): Payer: Managed Care, Other (non HMO) | Admitting: Certified Registered"

## 2015-02-06 ENCOUNTER — Ambulatory Visit (HOSPITAL_BASED_OUTPATIENT_CLINIC_OR_DEPARTMENT_OTHER)
Admission: RE | Admit: 2015-02-06 | Discharge: 2015-02-06 | Disposition: A | Discharge status: Home / Self Care | Payer: Managed Care, Other (non HMO) | Source: Ambulatory Visit | Attending: Orthopedic Surgery | Admitting: Orthopedic Surgery

## 2015-02-06 ENCOUNTER — Ambulatory Visit (HOSPITAL_COMMUNITY): Payer: Managed Care, Other (non HMO)

## 2015-02-06 ENCOUNTER — Encounter (HOSPITAL_BASED_OUTPATIENT_CLINIC_OR_DEPARTMENT_OTHER): Payer: Self-pay | Admitting: Certified Registered"

## 2015-02-06 DIAGNOSIS — T8484XA Pain due to internal orthopedic prosthetic devices, implants and grafts, initial encounter: Secondary | ICD-10-CM | POA: Insufficient documentation

## 2015-02-06 DIAGNOSIS — K219 Gastro-esophageal reflux disease without esophagitis: Secondary | ICD-10-CM | POA: Diagnosis not present

## 2015-02-06 DIAGNOSIS — L905 Scar conditions and fibrosis of skin: Secondary | ICD-10-CM | POA: Diagnosis not present

## 2015-02-06 DIAGNOSIS — M654 Radial styloid tenosynovitis [de Quervain]: Secondary | ICD-10-CM | POA: Diagnosis present

## 2015-02-06 DIAGNOSIS — I10 Essential (primary) hypertension: Secondary | ICD-10-CM | POA: Insufficient documentation

## 2015-02-06 DIAGNOSIS — Z419 Encounter for procedure for purposes other than remedying health state, unspecified: Secondary | ICD-10-CM

## 2015-02-06 HISTORY — PX: HARDWARE REMOVAL: SHX979

## 2015-02-06 HISTORY — PX: DORSAL COMPARTMENT RELEASE: SHX5039

## 2015-02-06 HISTORY — DX: Depression, unspecified: F32.A

## 2015-02-06 HISTORY — DX: Major depressive disorder, single episode, unspecified: F32.9

## 2015-02-06 LAB — POCT HEMOGLOBIN-HEMACUE: HEMOGLOBIN: 13.4 g/dL (ref 12.0–15.0)

## 2015-02-06 SURGERY — RELEASE, FIRST DORSAL COMPARTMENT, HAND
Anesthesia: General | Site: Wrist | Laterality: Left

## 2015-02-06 MED ORDER — GLYCOPYRROLATE 0.2 MG/ML IJ SOLN: 0.2000 mg | Freq: Once | INTRAMUSCULAR | Status: DC | PRN

## 2015-02-06 MED ORDER — EPHEDRINE SULFATE 50 MG/ML IJ SOLN
INTRAMUSCULAR | Status: DC | PRN
  Administered 2015-02-06 (×3): 10 mg via INTRAVENOUS

## 2015-02-06 MED ORDER — PROPOFOL 10 MG/ML IV BOLUS
INTRAVENOUS | Status: DC | PRN
  Administered 2015-02-06: 200 mg via INTRAVENOUS

## 2015-02-06 MED ORDER — FENTANYL CITRATE (PF) 100 MCG/2ML IJ SOLN
50.0000 ug | INTRAMUSCULAR | Status: AC | PRN
  Administered 2015-02-06: 50 ug via INTRAVENOUS
  Administered 2015-02-06 (×3): 25 ug via INTRAVENOUS

## 2015-02-06 MED ORDER — FENTANYL CITRATE (PF) 100 MCG/2ML IJ SOLN
INTRAMUSCULAR | Status: AC
  Filled 2015-02-06: qty 6

## 2015-02-06 MED ORDER — MIDAZOLAM HCL 2 MG/2ML IJ SOLN
1.0000 mg | INTRAMUSCULAR | Status: DC | PRN
Start: 2015-02-06 — End: 2015-02-06
  Administered 2015-02-06: 2 mg via INTRAVENOUS

## 2015-02-06 MED ORDER — ONDANSETRON HCL 4 MG/2ML IJ SOLN
INTRAMUSCULAR | Status: DC | PRN
  Administered 2015-02-06: 4 mg via INTRAVENOUS

## 2015-02-06 MED ORDER — LACTATED RINGERS IV SOLN: INTRAVENOUS | Status: DC

## 2015-02-06 MED ORDER — LIDOCAINE HCL (CARDIAC) 20 MG/ML IV SOLN
INTRAVENOUS | Status: DC | PRN
  Administered 2015-02-06: 30 mg via INTRAVENOUS

## 2015-02-06 MED ORDER — DEXAMETHASONE SODIUM PHOSPHATE 10 MG/ML IJ SOLN
INTRAMUSCULAR | Status: DC | PRN
Start: 2015-02-06 — End: 2015-02-06
  Administered 2015-02-06: 10 mg via INTRAVENOUS

## 2015-02-06 MED ORDER — CEFAZOLIN SODIUM-DEXTROSE 2-3 GM-% IV SOLR
2.0000 g | INTRAVENOUS | Status: AC
  Administered 2015-02-06: 2 g via INTRAVENOUS

## 2015-02-06 MED ORDER — HYDROCODONE-ACETAMINOPHEN 5-325 MG PO TABS: 1.0000 | ORAL_TABLET | ORAL | Status: DC | PRN

## 2015-02-06 MED ORDER — PROPOFOL 10 MG/ML IV BOLUS
INTRAVENOUS | Status: AC
  Filled 2015-02-06: qty 20

## 2015-02-06 MED ORDER — 0.9 % SODIUM CHLORIDE (POUR BTL) OPTIME
TOPICAL | Status: DC | PRN
  Administered 2015-02-06: 1000 mL

## 2015-02-06 MED ORDER — CEFAZOLIN SODIUM-DEXTROSE 2-3 GM-% IV SOLR
INTRAVENOUS | Status: AC
  Filled 2015-02-06: qty 50

## 2015-02-06 MED ORDER — BUPIVACAINE-EPINEPHRINE 0.5% -1:200000 IJ SOLN
INTRAMUSCULAR | Status: DC | PRN
  Administered 2015-02-06: 7 mL

## 2015-02-06 MED ORDER — SCOPOLAMINE 1 MG/3DAYS TD PT72: 1.0000 | MEDICATED_PATCH | Freq: Once | TRANSDERMAL | Status: DC | PRN

## 2015-02-06 MED ORDER — MIDAZOLAM HCL 2 MG/2ML IJ SOLN
INTRAMUSCULAR | Status: AC
  Filled 2015-02-06: qty 2

## 2015-02-06 MED ORDER — LACTATED RINGERS IV SOLN
INTRAVENOUS | Status: DC
  Administered 2015-02-06 (×2): via INTRAVENOUS

## 2015-02-06 SURGICAL SUPPLY — 72 items
APL SKNCLS STERI-STRIP NONHPOA (GAUZE/BANDAGES/DRESSINGS) ×1
BANDAGE COBAN STERILE 2 (GAUZE/BANDAGES/DRESSINGS) IMPLANT
BANDAGE ESMARK 6X9 LF (GAUZE/BANDAGES/DRESSINGS) IMPLANT
BENZOIN TINCTURE PRP APPL 2/3 (GAUZE/BANDAGES/DRESSINGS) ×2 IMPLANT
BLADE MINI RND TIP GREEN BEAV (BLADE) IMPLANT
BLADE SURG 15 STRL LF DISP TIS (BLADE) ×2 IMPLANT
BLADE SURG 15 STRL SS (BLADE) ×3
BNDG CMPR 9X4 STRL LF SNTH (GAUZE/BANDAGES/DRESSINGS) ×1
BNDG CMPR 9X6 STRL LF SNTH (GAUZE/BANDAGES/DRESSINGS)
BNDG COHESIVE 4X5 TAN STRL (GAUZE/BANDAGES/DRESSINGS) ×3 IMPLANT
BNDG ESMARK 4X9 LF (GAUZE/BANDAGES/DRESSINGS) ×2 IMPLANT
BNDG ESMARK 6X9 LF (GAUZE/BANDAGES/DRESSINGS)
BNDG GAUZE 1X2.1 STRL (MISCELLANEOUS) IMPLANT
BNDG GAUZE ELAST 4 BULKY (GAUZE/BANDAGES/DRESSINGS) ×6 IMPLANT
CHLORAPREP W/TINT 26ML (MISCELLANEOUS) ×3 IMPLANT
CLOSURE STERI-STRIP 1/2X4 (GAUZE/BANDAGES/DRESSINGS) ×1
CLSR STERI-STRIP ANTIMIC 1/2X4 (GAUZE/BANDAGES/DRESSINGS) ×1 IMPLANT
CORDS BIPOLAR (ELECTRODE) IMPLANT
COVER BACK TABLE 60X90IN (DRAPES) ×3 IMPLANT
COVER MAYO STAND STRL (DRAPES) ×3 IMPLANT
CUFF TOURNIQUET SINGLE 18IN (TOURNIQUET CUFF) ×2 IMPLANT
CUFF TOURNIQUET SINGLE 34IN LL (TOURNIQUET CUFF) IMPLANT
DRAIN PENROSE 1/2X12 LTX STRL (WOUND CARE) IMPLANT
DRAPE C-ARM 42X72 X-RAY (DRAPES) ×2 IMPLANT
DRAPE EXTREMITY T 121X128X90 (DRAPE) ×3 IMPLANT
DRAPE OEC MINIVIEW 54X84 (DRAPES) IMPLANT
DRAPE SURG 17X23 STRL (DRAPES) ×3 IMPLANT
DRSG EMULSION OIL 3X3 NADH (GAUZE/BANDAGES/DRESSINGS) ×3 IMPLANT
DRSG PAD ABDOMINAL 8X10 ST (GAUZE/BANDAGES/DRESSINGS) IMPLANT
ELECT REM PT RETURN 9FT ADLT (ELECTROSURGICAL) ×3
ELECTRODE REM PT RTRN 9FT ADLT (ELECTROSURGICAL) IMPLANT
GAUZE SPONGE 4X4 12PLY STRL (GAUZE/BANDAGES/DRESSINGS) ×3 IMPLANT
GLOVE BIO SURGEON STRL SZ7.5 (GLOVE) ×3 IMPLANT
GLOVE BIOGEL PI IND STRL 7.0 (GLOVE) ×1 IMPLANT
GLOVE BIOGEL PI IND STRL 8 (GLOVE) ×1 IMPLANT
GLOVE BIOGEL PI INDICATOR 7.0 (GLOVE) ×2
GLOVE BIOGEL PI INDICATOR 8 (GLOVE) ×2
GLOVE ECLIPSE 6.5 STRL STRAW (GLOVE) ×3 IMPLANT
GLOVE EXAM NITRILE MD LF STRL (GLOVE) ×2 IMPLANT
GOWN STRL REUS W/ TWL LRG LVL3 (GOWN DISPOSABLE) ×2 IMPLANT
GOWN STRL REUS W/TWL LRG LVL3 (GOWN DISPOSABLE) ×6
GOWN STRL REUS W/TWL XL LVL3 (GOWN DISPOSABLE) ×3 IMPLANT
NDL HYPO 25X1 1.5 SAFETY (NEEDLE) IMPLANT
NDL SAFETY ECLIPSE 18X1.5 (NEEDLE) IMPLANT
NEEDLE HYPO 18GX1.5 SHARP (NEEDLE) ×3
NEEDLE HYPO 25X1 1.5 SAFETY (NEEDLE) IMPLANT
NS IRRIG 1000ML POUR BTL (IV SOLUTION) ×3 IMPLANT
PACK BASIN DAY SURGERY FS (CUSTOM PROCEDURE TRAY) ×3 IMPLANT
PADDING CAST ABS 4INX4YD NS (CAST SUPPLIES) ×2
PADDING CAST ABS COTTON 4X4 ST (CAST SUPPLIES) ×1 IMPLANT
PENCIL BUTTON HOLSTER BLD 10FT (ELECTRODE) IMPLANT
RUBBERBAND STERILE (MISCELLANEOUS) IMPLANT
SPLINT PLASTER CAST XFAST 3X15 (CAST SUPPLIES) ×8 IMPLANT
SPLINT PLASTER XTRA FASTSET 3X (CAST SUPPLIES)
SPONGE GAUZE 4X4 12PLY STER LF (GAUZE/BANDAGES/DRESSINGS) ×3 IMPLANT
STOCKINETTE 6  STRL (DRAPES) ×2
STOCKINETTE 6 STRL (DRAPES) ×1 IMPLANT
SUCTION FRAZIER TIP 10 FR DISP (SUCTIONS) IMPLANT
SUT ETHILON 3 0 PS 1 (SUTURE) IMPLANT
SUT ETHILON 4 0 PS 2 18 (SUTURE) ×2 IMPLANT
SUT VIC AB 2-0 PS2 27 (SUTURE) IMPLANT
SUT VICRYL RAPIDE 4-0 (SUTURE) IMPLANT
SUT VICRYL RAPIDE 4/0 PS 2 (SUTURE) IMPLANT
SWAB COLLECTION DEVICE MRSA (MISCELLANEOUS) IMPLANT
SYR BULB 3OZ (MISCELLANEOUS) ×3 IMPLANT
SYRINGE 10CC LL (SYRINGE) IMPLANT
TOWEL OR 17X24 6PK STRL BLUE (TOWEL DISPOSABLE) ×3 IMPLANT
TOWEL OR NON WOVEN STRL DISP B (DISPOSABLE) ×3 IMPLANT
TUBE ANAEROBIC SPECIMEN COL (MISCELLANEOUS) IMPLANT
TUBE CONNECTING 20'X1/4 (TUBING)
TUBE CONNECTING 20X1/4 (TUBING) IMPLANT
UNDERPAD 30X30 (UNDERPADS AND DIAPERS) ×3 IMPLANT

## 2015-02-06 NOTE — Interval H&P Note (Signed)
History and Physical Interval Note:  02/06/2015 10:07 AM  Julie Gray  has presented today for surgery, with the diagnosis of LEFT WRIST [place] HARDWARE  M65.4, T85.84XA  The various methods of treatment have been discussed with the patient and family. After consideration of risks, benefits and other options for treatment, the patient has consented to  Procedure(s): DEQUERVAIN'S RELEASE (Left) LEFT WRIST REMOVAL OF HARDWARE (Left) as a surgical intervention .  The patient's history has been reviewed, patient examined, no change in status, stable for surgery.  I have reviewed the patient's chart and labs.  Questions were answered to the patient's satisfaction.     [name]

## 2015-02-06 NOTE — Op Note (Signed)
02/06/2015  12:07 PM  PATIENT:  Julie Gray  59 y.o. female  PRE-OPERATIVE DIAGNOSIS:  Left wrist de Quervain's tenosynovitis status post ORIF of distal radius fracture  POST-OPERATIVE DIAGNOSIS:  Same  PROCEDURE:  Removal of hardware from left distal radius with scar revision and first dorsal compartment plasty with thorough debridement of tenosynovium  SURGEON: Rayvon Char. Grandville Silos, MD  PHYSICIAN ASSISTANT: None  ANESTHESIA:  general  SPECIMENS:  None  DRAINS:   None  EBL:  less than 50 mL  PREOPERATIVE INDICATIONS:  Julie Gray is a  59 y.o. female with refractory left wrist de Quervain's tenosynovitis following operative treatment of a distal radius fracture.  The risks benefits and alternatives were discussed with the patient preoperatively including but not limited to the risks of infection, bleeding, nerve injury, cardiopulmonary complications, the need for revision surgery, among others, and the patient verbalized understanding and consented to proceed.  OPERATIVE IMPLANTS: Distal screws and pegs removed from skeletal dynamics plate  OPERATIVE PROCEDURE:  After receiving prophylactic antibiotics, the patient was escorted to the operative theatre and placed in a supine position.  General anesthesia was administered. A surgical "time-out" was performed during which the planned procedure, proposed operative site, and the correct patient identity were compared to the operative consent and agreement confirmed by the circulating nurse according to current facility policy.  Following application of a tourniquet to the operative extremity, the exposed skin was prepped with Chloraprep and draped in the usual sterile fashion.  The limb was exsanguinated with an Esmarch bandage and the tourniquet inflated to approximately 175mmHg higher than systolic BP.  An oblique incision was marked overlying the first dorsal compartment tendons at the distal radius, and the distal aspect of  her previous volar approach was outlined for excision. The regions were infiltrated with half percent Marcaine with epinephrine and then the skin incised sharply with a scalpel. A small strip of scar was excised volarly and a fresh incision made radially. Spreading dissection was carried down radially to the first dorsal compartment which was then split in a Z-plasty fashion allowing for a distal palmar flap and a proximal dorsal flap. The tendons were intact, but narrowed through the zone where the sheath and become thickened and tight. In the floor of the compartment, the soft tissues were overlying one of the pegs such that through the translucency of the tissues that they can be identified but did not seem to be changing the shape of the canal. The tendons were thoroughly debrided of the thickened tenosynovium and then through the volar incision spreading dissection was carried down to the tissue overlying the plate which was split transversely to reveal the distal T portion of the plate. All of the distal pegs and screws were removed one at a time and confirmed fluoroscopically. Wounds were all copiously irrigated and the tourniquet released. Some additional hemostasis was obtained. The compartment plasty of the first compartment was reapproximated in its expanded position allowing for greatly improved space for the first dorsal compartment tendons. That wound was closed with a couple of 4-0 Vicryl Rapide buried subcuticular sutures and a running suture with benzoin and Steri-Strips.  The volar wound was closed with interrupted horizontal mattress 4-0 nylon sutures. A light dressing was applied and she was awakened and taken to the recovery room stable condition, breathing spontaneously  DISPOSITION: She'll be discharged home with typical instructions including initiation of skin mobility exercises on postop day 3, protecting the repair with her removable wrist  splint during the waking hours, returning 10-15  days for evaluation

## 2015-02-06 NOTE — Transfer of Care (Signed)
Immediate Anesthesia Transfer of Care Note  Patient: Julie Gray  Procedure(s) Performed: Procedure(s): DEQUERVAIN'S RELEASE (Left) LEFT WRIST REMOVAL OF HARDWARE (Left)  Patient Location: PACU  Anesthesia Type:General  Level of Consciousness: awake and patient cooperative  Airway & Oxygen Therapy: Patient Spontanous Breathing and Patient connected to face mask oxygen  Post-op Assessment: Report given to RN and Post -op Vital signs reviewed and stable  Post vital signs: Reviewed and stable  Last Vitals: There were no vitals filed for this visit.  Complications: No apparent anesthesia complications

## 2015-02-06 NOTE — Anesthesia Preprocedure Evaluation (Addendum)
Anesthesia Evaluation  Patient identified by MRN, date of birth, ID band Patient awake    Reviewed: Allergy & Precautions, NPO status   Airway Mallampati: II  TM Distance: >3 FB Neck ROM: Full    Dental  (+) Teeth Intact, Dental Advisory Given   Pulmonary  breath sounds clear to auscultation        Cardiovascular hypertension, Pt. on medications Rhythm:Regular Rate:Normal     Neuro/Psych PSYCHIATRIC DISORDERS Depression    GI/Hepatic GERD-  Medicated,  Endo/Other    Renal/GU      Musculoskeletal  (+) Arthritis -,   Abdominal   Peds  Hematology   Anesthesia Other Findings   Reproductive/Obstetrics                            Anesthesia Physical Anesthesia Plan  ASA: II  Anesthesia Plan: General   Post-op Pain Management:    Induction: Intravenous  Airway Management Planned: LMA  Additional Equipment:   Intra-op Plan:   Post-operative Plan:   Informed Consent: I have reviewed the patients History and Physical, chart, labs and discussed the procedure including the risks, benefits and alternatives for the proposed anesthesia with the patient or authorized representative who has indicated his/her understanding and acceptance.   Dental advisory given  Plan Discussed with: CRNA and Anesthesiologist  Anesthesia Plan Comments:         Anesthesia Quick Evaluation

## 2015-02-06 NOTE — Anesthesia Procedure Notes (Signed)
Procedure Name: LMA Insertion Date/Time: 02/06/2015 10:27 AM Performed by: Neenah Canter D Pre-anesthesia Checklist: Patient identified, Emergency Drugs available, Suction available and Patient being monitored Patient Re-evaluated:Patient Re-evaluated prior to inductionOxygen Delivery Method: Circle System Utilized Preoxygenation: Pre-oxygenation with 100% oxygen Intubation Type: IV induction Ventilation: Mask ventilation without difficulty LMA: LMA inserted LMA Size: 4.0 Number of attempts: 1 Airway Equipment and Method: Bite block Placement Confirmation: positive ETCO2 Tube secured with: Tape Dental Injury: Teeth and Oropharynx as per pre-operative assessment

## 2015-02-06 NOTE — Anesthesia Postprocedure Evaluation (Signed)
  Anesthesia Post-op Note  Patient: Julie Gray  Procedure(s) Performed: Procedure(s): DEQUERVAIN'S RELEASE (Left) LEFT WRIST REMOVAL OF HARDWARE (Left)  Patient Location: PACU  Anesthesia Type:General  Level of Consciousness: awake, alert  and oriented  Airway and Oxygen Therapy: Patient Spontanous Breathing  Post-op Pain: mild  Post-op Assessment: Post-op Vital signs reviewed              Post-op Vital Signs: stable  Last Vitals:  Filed Vitals:   02/06/15 1227  BP:   Pulse: 90  Temp: 36.4 C  Resp: 20    Complications: No apparent anesthesia complications

## 2015-02-06 NOTE — Discharge Instructions (Addendum)
Discharge Instructions   You have a light dressing on your hand.  You may begin gentle motion of your fingers and hand immediately, but you should not do any heavy lifting or gripping.  Elevate your hand to reduce pain & swelling of the digits.  Ice over the operative site may be helpful to reduce pain & swelling.  DO NOT USE HEAT. Pain medicine has been prescribed for you.  Use your medicine as needed over the first 48 hours, and then you can begin to taper your use. You may use Tylenol in place of your prescribed pain medication, but not IN ADDITION to it. Leave the dressing in place until the third day after your surgery and then remove it, leaving it open to air.  AT THAT POINT, GO BACK TO USING YOUR REMOVEABLE VELCRO SPLINT FOR PROTECTION WHILE AWAKE AND BEGIN YOUR SKIN MOBILITY EXERCISES AND WRIST STRETCHING. After the bandage has been removed you may shower, regularly washing the incision and letting the water run over it, but not submerging it (no swimming, soaking it in dishwater, etc.) You may drive a car when you are off of prescription pain medications and can safely control your vehicle with both hands. We will address whether therapy will be required or not when you return to the office. You may have already made your follow-up appointment when we completed your preop visit.  If not, please call our office today or the next business day to make your return appointment for 10-15 days after surgery.   Please call [phone] during normal business hours or [phone] after hours for any problems. Including the following:  - excessive redness of the incisions - drainage for more than 4 days - fever of more than 101.5 F  *Please note that pain medications will not be refilled after hours or on weekends.    Post Anesthesia Home Care Instructions  Activity: Get plenty of rest for the remainder of the day. A responsible adult should stay with you for 24 hours following the  procedure.  For the next 24 hours, DO NOT: -Drive a car -Paediatric nurse -Drink alcoholic beverages -Take any medication unless instructed by your physician -Make any legal decisions or sign important papers.  Meals: Start with liquid foods such as gelatin or soup. Progress to regular foods as tolerated. Avoid greasy, spicy, heavy foods. If nausea and/or vomiting occur, drink only clear liquids until the nausea and/or vomiting subsides. Call your physician if vomiting continues.  Special Instructions/Symptoms: Your throat may feel dry or sore from the anesthesia or the breathing tube placed in your throat during surgery. If this causes discomfort, gargle with warm salt water. The discomfort should disappear within 24 hours.  If you had a scopolamine patch placed behind your ear for the management of post- operative nausea and/or vomiting:  1. The medication in the patch is effective for 72 hours, after which it should be removed.  Wrap patch in a tissue and discard in the trash. Wash hands thoroughly with soap and water. 2. You may remove the patch earlier than 72 hours if you experience unpleasant side effects which may include dry mouth, dizziness or visual disturbances. 3. Avoid touching the patch. Wash your hands with soap and water after contact with the patch.

## 2015-02-07 ENCOUNTER — Encounter (HOSPITAL_BASED_OUTPATIENT_CLINIC_OR_DEPARTMENT_OTHER): Payer: Self-pay | Admitting: Orthopedic Surgery

## 2015-02-13 ENCOUNTER — Encounter: Payer: Self-pay | Admitting: Family Medicine

## 2015-02-13 LAB — HM MAMMOGRAPHY: HM Mammogram: NORMAL

## 2015-02-23 ENCOUNTER — Other Ambulatory Visit: Payer: Self-pay | Admitting: Family Medicine

## 2015-02-23 DIAGNOSIS — F418 Other specified anxiety disorders: Secondary | ICD-10-CM

## 2015-02-23 DIAGNOSIS — Z23 Encounter for immunization: Secondary | ICD-10-CM

## 2015-02-23 DIAGNOSIS — Z780 Asymptomatic menopausal state: Secondary | ICD-10-CM

## 2015-02-23 DIAGNOSIS — K219 Gastro-esophageal reflux disease without esophagitis: Secondary | ICD-10-CM

## 2015-02-23 DIAGNOSIS — M25562 Pain in left knee: Secondary | ICD-10-CM

## 2015-02-23 DIAGNOSIS — I10 Essential (primary) hypertension: Secondary | ICD-10-CM

## 2015-02-23 MED ORDER — OMEPRAZOLE 20 MG PO CPDR: 40.0000 mg | DELAYED_RELEASE_CAPSULE | Freq: Every day | ORAL | Status: DC

## 2015-02-23 MED ORDER — VENLAFAXINE HCL ER 75 MG PO CP24: 75.0000 mg | ORAL_CAPSULE | Freq: Every day | ORAL | Status: DC

## 2015-03-03 ENCOUNTER — Other Ambulatory Visit (INDEPENDENT_AMBULATORY_CARE_PROVIDER_SITE_OTHER): Payer: Managed Care, Other (non HMO)

## 2015-03-03 DIAGNOSIS — L989 Disorder of the skin and subcutaneous tissue, unspecified: Secondary | ICD-10-CM

## 2015-03-03 DIAGNOSIS — Z780 Asymptomatic menopausal state: Secondary | ICD-10-CM | POA: Diagnosis not present

## 2015-03-03 DIAGNOSIS — E782 Mixed hyperlipidemia: Secondary | ICD-10-CM

## 2015-03-03 DIAGNOSIS — Z0000 Encounter for general adult medical examination without abnormal findings: Secondary | ICD-10-CM

## 2015-03-03 LAB — COMPREHENSIVE METABOLIC PANEL
ALK PHOS: 94 U/L (ref 39–117)
ALT: 17 U/L (ref 0–35)
AST: 17 U/L (ref 0–37)
Albumin: 4 g/dL (ref 3.5–5.2)
BILIRUBIN TOTAL: 0.5 mg/dL (ref 0.2–1.2)
BUN: 17 mg/dL (ref 6–23)
CALCIUM: 9.6 mg/dL (ref 8.4–10.5)
CO2: 33 meq/L — AB (ref 19–32)
Chloride: 105 mEq/L (ref 96–112)
Creatinine, Ser: 0.99 mg/dL (ref 0.40–1.20)
GFR: 60.98 mL/min (ref >60.00)
GLUCOSE: 100 mg/dL — AB (ref 70–99)
POTASSIUM: 4.4 meq/L (ref 3.5–5.1)
Sodium: 144 mEq/L (ref 135–145)
TOTAL PROTEIN: 6.9 g/dL (ref 6.0–8.3)

## 2015-03-03 LAB — LIPID PANEL
Cholesterol: 213 mg/dL — ABNORMAL HIGH (ref 0–200)
HDL: 43.2 mg/dL (ref >39.00)
LDL Cholesterol: 134 mg/dL — ABNORMAL HIGH (ref 0–99)
NonHDL: 169.8
TRIGLYCERIDES: 177 mg/dL — AB (ref 0.0–149.0)
Total CHOL/HDL Ratio: 5
VLDL: 35.4 mg/dL (ref 0.0–40.0)

## 2015-03-03 LAB — CBC
HEMATOCRIT: 41.8 % (ref 36.0–46.0)
HEMOGLOBIN: 14.2 g/dL (ref 12.0–15.0)
MCHC: 34 g/dL (ref 30.0–36.0)
MCV: 87.9 fl (ref 78.0–100.0)
Platelets: 207 10*3/uL (ref 150.0–400.0)
RBC: 4.75 Mil/uL (ref 3.87–5.11)
RDW: 12.9 % (ref 11.5–15.5)
WBC: 3.5 10*3/uL — AB (ref 4.0–10.5)

## 2015-03-03 LAB — TSH: TSH: 1.59 u[IU]/mL (ref 0.35–4.50)

## 2015-03-06 ENCOUNTER — Telehealth: Payer: Self-pay | Admitting: Family Medicine

## 2015-03-06 NOTE — Telephone Encounter (Signed)
Pt returning call for lab results. [phone]

## 2015-03-10 ENCOUNTER — Encounter: Payer: Managed Care, Other (non HMO) | Admitting: Family Medicine

## 2015-05-04 ENCOUNTER — Encounter: Payer: Self-pay | Admitting: Family Medicine

## 2015-08-17 ENCOUNTER — Telehealth: Payer: Self-pay | Admitting: *Deleted

## 2015-08-17 ENCOUNTER — Encounter: Payer: Self-pay | Admitting: *Deleted

## 2015-08-17 NOTE — Telephone Encounter (Signed)
Pre-Visit Call completed with patient and chart updated.   Pre-Visit Info documented in Specialty Comments under [employer].    

## 2015-08-18 ENCOUNTER — Ambulatory Visit (INDEPENDENT_AMBULATORY_CARE_PROVIDER_SITE_OTHER): Payer: Managed Care, Other (non HMO) | Admitting: Family Medicine

## 2015-08-18 ENCOUNTER — Encounter: Payer: Self-pay | Admitting: Family Medicine

## 2015-08-18 VITALS — BP 114/74 | HR 78 | Temp 98.0°F | Ht 64.0 in | Wt 213.4 lb

## 2015-08-18 DIAGNOSIS — M25562 Pain in left knee: Secondary | ICD-10-CM

## 2015-08-18 DIAGNOSIS — M791 Myalgia, unspecified site: Secondary | ICD-10-CM

## 2015-08-18 DIAGNOSIS — E669 Obesity, unspecified: Secondary | ICD-10-CM

## 2015-08-18 DIAGNOSIS — Z780 Asymptomatic menopausal state: Secondary | ICD-10-CM

## 2015-08-18 DIAGNOSIS — F32A Depression, unspecified: Secondary | ICD-10-CM

## 2015-08-18 DIAGNOSIS — Z23 Encounter for immunization: Secondary | ICD-10-CM | POA: Diagnosis not present

## 2015-08-18 DIAGNOSIS — I10 Essential (primary) hypertension: Secondary | ICD-10-CM | POA: Diagnosis not present

## 2015-08-18 DIAGNOSIS — E782 Mixed hyperlipidemia: Secondary | ICD-10-CM | POA: Diagnosis not present

## 2015-08-18 DIAGNOSIS — Z0000 Encounter for general adult medical examination without abnormal findings: Secondary | ICD-10-CM | POA: Diagnosis not present

## 2015-08-18 DIAGNOSIS — F419 Anxiety disorder, unspecified: Secondary | ICD-10-CM

## 2015-08-18 DIAGNOSIS — K219 Gastro-esophageal reflux disease without esophagitis: Secondary | ICD-10-CM | POA: Diagnosis not present

## 2015-08-18 DIAGNOSIS — F418 Other specified anxiety disorders: Secondary | ICD-10-CM

## 2015-08-18 DIAGNOSIS — F329 Major depressive disorder, single episode, unspecified: Secondary | ICD-10-CM

## 2015-08-18 MED ORDER — GABAPENTIN 300 MG PO CAPS: 300.0000 mg | ORAL_CAPSULE | Freq: Two times a day (BID) | ORAL | Status: DC

## 2015-08-18 MED ORDER — VENLAFAXINE HCL ER 75 MG PO CP24: 75.0000 mg | ORAL_CAPSULE | Freq: Every day | ORAL | Status: DC

## 2015-08-18 MED ORDER — OMEPRAZOLE 20 MG PO CPDR: 40.0000 mg | DELAYED_RELEASE_CAPSULE | Freq: Every day | ORAL | Status: DC

## 2015-08-18 MED ORDER — OLMESARTAN MEDOXOMIL-HCTZ 40-12.5 MG PO TABS: 1.0000 | ORAL_TABLET | Freq: Every day | ORAL | Status: DC

## 2015-08-18 NOTE — Assessment & Plan Note (Signed)
Encouraged DASH diet, decrease po intake and increase exercise as tolerated. Needs 7-8 hours of sleep nightly. Avoid trans fats, eat small, frequent meals every 4-5 hours with lean proteins, complex carbs and healthy fats. Minimize simple carbs 

## 2015-08-18 NOTE — Progress Notes (Signed)
Pre visit review using our clinic review tool, if applicable. No additional management support is needed unless otherwise documented below in the visit note. 

## 2015-08-18 NOTE — Assessment & Plan Note (Signed)
Patient encouraged to maintain heart healthy diet, regular exercise, adequate sleep. Consider daily probiotics. Take medications as prescribed 

## 2015-08-18 NOTE — Assessment & Plan Note (Signed)
Doing well on current dose.

## 2015-08-18 NOTE — Patient Instructions (Addendum)
L Tryptophan capsule daily  [employer] probiotic cap 10 strains 1 cap daily [email or web address]   Preventive Care for Adults, Female A healthy lifestyle and preventive care can promote health and wellness. Preventive health guidelines for women include the following key practices.  A routine yearly physical is a good way to check with your health care provider about your health and preventive screening. It is a chance to share any concerns and updates on your health and to receive a thorough exam.  Visit your dentist for a routine exam and preventive care every 6 months. Brush your teeth twice a day and floss once a day. Good oral hygiene prevents tooth decay and gum disease.  The frequency of eye exams is based on your age, health, family medical history, use of contact lenses, and other factors. Follow your health care provider's recommendations for frequency of eye exams.  Eat a healthy diet. Foods like vegetables, fruits, whole grains, low-fat dairy products, and lean protein foods contain the nutrients you need without too many calories. Decrease your intake of foods high in solid fats, added sugars, and salt. Eat the right amount of calories for you.Get information about a proper diet from your health care provider, if necessary.  Regular physical exercise is one of the most important things you can do for your health. Most adults should get at least 150 minutes of moderate-intensity exercise (any activity that increases your heart rate and causes you to sweat) each week. In addition, most adults need muscle-strengthening exercises on 2 or more days a week.  Maintain a healthy weight. The body mass index (BMI) is a screening tool to identify possible weight problems. It provides an estimate of body fat based on height and weight. Your health care provider can find your BMI and can help you achieve or maintain a healthy weight.For adults 20 years and older:  A BMI below 18.5 is considered  underweight.  A BMI of 18.5 to 24.9 is normal.  A BMI of 25 to 29.9 is considered overweight.  A BMI of 30 and above is considered obese.  Maintain normal blood lipids and cholesterol levels by exercising and minimizing your intake of saturated fat. Eat a balanced diet with plenty of fruit and vegetables. Blood tests for lipids and cholesterol should begin at age 31 and be repeated every 5 years. If your lipid or cholesterol levels are high, you are over 50, or you are at high risk for heart disease, you may need your cholesterol levels checked more frequently.Ongoing high lipid and cholesterol levels should be treated with medicines if diet and exercise are not working.  If you smoke, find out from your health care provider how to quit. If you do not use tobacco, do not start.  Lung cancer screening is recommended for adults aged 24-80 years who are at high risk for developing lung cancer because of a history of smoking. A yearly low-dose CT scan of the lungs is recommended for people who have at least a 30-pack-year history of smoking and are a current smoker or have quit within the past 15 years. A pack year of smoking is smoking an average of 1 pack of cigarettes a day for 1 year (for example: 1 pack a day for 30 years or 2 packs a day for 15 years). Yearly screening should continue until the smoker has stopped smoking for at least 15 years. Yearly screening should be stopped for people who develop a health problem that would prevent them  from having lung cancer treatment.  If you are pregnant, do not drink alcohol. If you are breastfeeding, be very cautious about drinking alcohol. If you are not pregnant and choose to drink alcohol, do not have more than 1 drink per day. One drink is considered to be 12 ounces (355 mL) of beer, 5 ounces (148 mL) of wine, or 1.5 ounces (44 mL) of liquor.  Avoid use of street drugs. Do not share needles with anyone. Ask for help if you need support or  instructions about stopping the use of drugs.  High blood pressure causes heart disease and increases the risk of stroke. Your blood pressure should be checked at least every 1 to 2 years. Ongoing high blood pressure should be treated with medicines if weight loss and exercise do not work.  If you are 55-79 years old, ask your health care provider if you should take aspirin to prevent strokes.  Diabetes screening is done by taking a blood sample to check your blood glucose level after you have not eaten for a certain period of time (fasting). If you are not overweight and you do not have risk factors for diabetes, you should be screened once every 3 years starting at age 45. If you are overweight or obese and you are 40-70 years of age, you should be screened for diabetes every year as part of your cardiovascular risk assessment.  Breast cancer screening is essential preventive care for women. You should practice "breast self-awareness." This means understanding the normal appearance and feel of your breasts and may include breast self-examination. Any changes detected, no matter how small, should be reported to a health care provider. Women in their 20s and 30s should have a clinical breast exam (CBE) by a health care provider as part of a regular health exam every 1 to 3 years. After age 40, women should have a CBE every year. Starting at age 40, women should consider having a mammogram (breast X-ray test) every year. Women who have a family history of breast cancer should talk to their health care provider about genetic screening. Women at a high risk of breast cancer should talk to their health care providers about having an MRI and a mammogram every year.  Breast cancer gene (BRCA)-related cancer risk assessment is recommended for women who have family members with BRCA-related cancers. BRCA-related cancers include breast, ovarian, tubal, and peritoneal cancers. Having family members with these  cancers may be associated with an increased risk for harmful changes (mutations) in the breast cancer genes BRCA1 and BRCA2. Results of the assessment will determine the need for genetic counseling and BRCA1 and BRCA2 testing.  Your health care provider may recommend that you be screened regularly for cancer of the pelvic organs (ovaries, uterus, and vagina). This screening involves a pelvic examination, including checking for microscopic changes to the surface of your cervix (Pap test). You may be encouraged to have this screening done every 3 years, beginning at age 21.  For women ages 30-65, health care providers may recommend pelvic exams and Pap testing every 3 years, or they may recommend the Pap and pelvic exam, combined with testing for human papilloma virus (HPV), every 5 years. Some types of HPV increase your risk of cervical cancer. Testing for HPV may also be done on women of any age with unclear Pap test results.  Other health care providers may not recommend any screening for nonpregnant women who are considered low risk for pelvic cancer and who   do not have symptoms. Ask your health care provider if a screening pelvic exam is right for you.  If you have had past treatment for cervical cancer or a condition that could lead to cancer, you need Pap tests and screening for cancer for at least 20 years after your treatment. If Pap tests have been discontinued, your risk factors (such as having a new sexual partner) need to be reassessed to determine if screening should resume. Some women have medical problems that increase the chance of getting cervical cancer. In these cases, your health care provider may recommend more frequent screening and Pap tests.  Colorectal cancer can be detected and often prevented. Most routine colorectal cancer screening begins at the age of 50 years and continues through age 75 years. However, your health care provider may recommend screening at an earlier age if you  have risk factors for colon cancer. On a yearly basis, your health care provider may provide home test kits to check for hidden blood in the stool. Use of a small camera at the end of a tube, to directly examine the colon (sigmoidoscopy or colonoscopy), can detect the earliest forms of colorectal cancer. Talk to your health care provider about this at age 50, when routine screening begins. Direct exam of the colon should be repeated every 5-10 years through age 75 years, unless early forms of precancerous polyps or small growths are found.  People who are at an increased risk for hepatitis B should be screened for this virus. You are considered at high risk for hepatitis B if:  You were born in a country where hepatitis B occurs often. Talk with your health care provider about which countries are considered high risk.  Your parents were born in a high-risk country and you have not received a shot to protect against hepatitis B (hepatitis B vaccine).  You have HIV or AIDS.  You use needles to inject street drugs.  You live with, or have sex with, someone who has hepatitis B.  You get hemodialysis treatment.  You take certain medicines for conditions like cancer, organ transplantation, and autoimmune conditions.  Hepatitis C blood testing is recommended for all people born from 1945 through 1965 and any individual with known risks for hepatitis C.  Practice safe sex. Use condoms and avoid high-risk sexual practices to reduce the spread of sexually transmitted infections (STIs). STIs include gonorrhea, chlamydia, syphilis, trichomonas, herpes, HPV, and human immunodeficiency virus (HIV). Herpes, HIV, and HPV are viral illnesses that have no cure. They can result in disability, cancer, and death.  You should be screened for sexually transmitted illnesses (STIs) including gonorrhea and chlamydia if:  You are sexually active and are younger than 24 years.  You are older than 24 years and your  health care provider tells you that you are at risk for this type of infection.  Your sexual activity has changed since you were last screened and you are at an increased risk for chlamydia or gonorrhea. Ask your health care provider if you are at risk.  If you are at risk of being infected with HIV, it is recommended that you take a prescription medicine daily to prevent HIV infection. This is called preexposure prophylaxis (PrEP). You are considered at risk if:  You are sexually active and do not regularly use condoms or know the HIV status of your partner(s).  You take drugs by injection.  You are sexually active with a partner who has HIV.  Talk with   your health care provider about whether you are at high risk of being infected with HIV. If you choose to begin PrEP, you should first be tested for HIV. You should then be tested every 3 months for as long as you are taking PrEP.  Osteoporosis is a disease in which the bones lose minerals and strength with aging. This can result in serious bone fractures or breaks. The risk of osteoporosis can be identified using a bone density scan. Women ages 87 years and over and women at risk for fractures or osteoporosis should discuss screening with their health care providers. Ask your health care provider whether you should take a calcium supplement or vitamin D to reduce the rate of osteoporosis.  Menopause can be associated with physical symptoms and risks. Hormone replacement therapy is available to decrease symptoms and risks. You should talk to your health care provider about whether hormone replacement therapy is right for you.  Use sunscreen. Apply sunscreen liberally and repeatedly throughout the day. You should seek shade when your shadow is shorter than you. Protect yourself by wearing long sleeves, pants, a wide-brimmed hat, and sunglasses year round, whenever you are outdoors.  Once a month, do a whole body skin exam, using a mirror to look  at the skin on your back. Tell your health care provider of new moles, moles that have irregular borders, moles that are larger than a pencil eraser, or moles that have changed in shape or color.  Stay current with required vaccines (immunizations).  Influenza vaccine. All adults should be immunized every year.  Tetanus, diphtheria, and acellular pertussis (Td, Tdap) vaccine. Pregnant women should receive 1 dose of Tdap vaccine during each pregnancy. The dose should be obtained regardless of the length of time since the last dose. Immunization is preferred during the 27th-36th week of gestation. An adult who has not previously received Tdap or who does not know her vaccine status should receive 1 dose of Tdap. This initial dose should be followed by tetanus and diphtheria toxoids (Td) booster doses every 10 years. Adults with an unknown or incomplete history of completing a 3-dose immunization series with Td-containing vaccines should begin or complete a primary immunization series including a Tdap dose. Adults should receive a Td booster every 10 years.  Varicella vaccine. An adult without evidence of immunity to varicella should receive 2 doses or a second dose if she has previously received 1 dose. Pregnant females who do not have evidence of immunity should receive the first dose after pregnancy. This first dose should be obtained before leaving the health care facility. The second dose should be obtained 4-8 weeks after the first dose.  Human papillomavirus (HPV) vaccine. Females aged 13-26 years who have not received the vaccine previously should obtain the 3-dose series. The vaccine is not recommended for use in pregnant females. However, pregnancy testing is not needed before receiving a dose. If a female is found to be pregnant after receiving a dose, no treatment is needed. In that case, the remaining doses should be delayed until after the pregnancy. Immunization is recommended for any person  with an immunocompromised condition through the age of 24 years if she did not get any or all doses earlier. During the 3-dose series, the second dose should be obtained 4-8 weeks after the first dose. The third dose should be obtained 24 weeks after the first dose and 16 weeks after the second dose.  Zoster vaccine. One dose is recommended for adults aged 45  years or older unless certain conditions are present.  Measles, mumps, and rubella (MMR) vaccine. Adults born before 1957 generally are considered immune to measles and mumps. Adults born in 1957 or later should have 1 or more doses of MMR vaccine unless there is a contraindication to the vaccine or there is laboratory evidence of immunity to each of the three diseases. A routine second dose of MMR vaccine should be obtained at least 28 days after the first dose for students attending postsecondary schools, health care workers, or international travelers. People who received inactivated measles vaccine or an unknown type of measles vaccine during 1963-1967 should receive 2 doses of MMR vaccine. People who received inactivated mumps vaccine or an unknown type of mumps vaccine before 1979 and are at high risk for mumps infection should consider immunization with 2 doses of MMR vaccine. For females of childbearing age, rubella immunity should be determined. If there is no evidence of immunity, females who are not pregnant should be vaccinated. If there is no evidence of immunity, females who are pregnant should delay immunization until after pregnancy. Unvaccinated health care workers born before 1957 who lack laboratory evidence of measles, mumps, or rubella immunity or laboratory confirmation of disease should consider measles and mumps immunization with 2 doses of MMR vaccine or rubella immunization with 1 dose of MMR vaccine.  Pneumococcal 13-valent conjugate (PCV13) vaccine. When indicated, a person who is uncertain of his immunization history and has  no record of immunization should receive the PCV13 vaccine. All adults 65 years of age and older should receive this vaccine. An adult aged 19 years or older who has certain medical conditions and has not been previously immunized should receive 1 dose of PCV13 vaccine. This PCV13 should be followed with a dose of pneumococcal polysaccharide (PPSV23) vaccine. Adults who are at high risk for pneumococcal disease should obtain the PPSV23 vaccine at least 8 weeks after the dose of PCV13 vaccine. Adults older than 60 years of age who have normal immune system function should obtain the PPSV23 vaccine dose at least 1 year after the dose of PCV13 vaccine.  Pneumococcal polysaccharide (PPSV23) vaccine. When PCV13 is also indicated, PCV13 should be obtained first. All adults aged 65 years and older should be immunized. An adult younger than age 65 years who has certain medical conditions should be immunized. Any person who resides in a nursing home or long-term care facility should be immunized. An adult smoker should be immunized. People with an immunocompromised condition and certain other conditions should receive both PCV13 and PPSV23 vaccines. People with human immunodeficiency virus (HIV) infection should be immunized as soon as possible after diagnosis. Immunization during chemotherapy or radiation therapy should be avoided. Routine use of PPSV23 vaccine is not recommended for American Indians, [place] Natives, or people younger than 65 years unless there are medical conditions that require PPSV23 vaccine. When indicated, people who have unknown immunization and have no record of immunization should receive PPSV23 vaccine. One-time revaccination 5 years after the first dose of PPSV23 is recommended for people aged 19-64 years who have chronic kidney failure, nephrotic syndrome, asplenia, or immunocompromised conditions. People who received 1-2 doses of PPSV23 before age 65 years should receive another dose of PPSV23  vaccine at age 65 years or later if at least 5 years have passed since the previous dose. Doses of PPSV23 are not needed for people immunized with PPSV23 at or after age 65 years.  Meningococcal vaccine. Adults with asplenia or persistent complement   component deficiencies should receive 2 doses of quadrivalent meningococcal conjugate (MenACWY-D) vaccine. The doses should be obtained at least 2 months apart. Microbiologists working with certain meningococcal bacteria, military recruits, people at risk during an outbreak, and people who travel to or live in countries with a high rate of meningitis should be immunized. A first-year college student up through age 21 years who is living in a residence hall should receive a dose if she did not receive a dose on or after her 16th birthday. Adults who have certain high-risk conditions should receive one or more doses of vaccine.  Hepatitis A vaccine. Adults who wish to be protected from this disease, have certain high-risk conditions, work with hepatitis A-infected animals, work in hepatitis A research labs, or travel to or work in countries with a high rate of hepatitis A should be immunized. Adults who were previously unvaccinated and who anticipate close contact with an international adoptee during the first 60 days after arrival in the [place] from a country with a high rate of hepatitis A should be immunized.  Hepatitis B vaccine. Adults who wish to be protected from this disease, have certain high-risk conditions, may be exposed to blood or other infectious body fluids, are household contacts or sex partners of hepatitis B positive people, are clients or workers in certain care facilities, or travel to or work in countries with a high rate of hepatitis B should be immunized.  Haemophilus influenzae type b (Hib) vaccine. A previously unvaccinated person with asplenia or sickle cell disease or having a scheduled splenectomy should receive 1 dose of Hib  vaccine. Regardless of previous immunization, a recipient of a hematopoietic stem cell transplant should receive a 3-dose series 6-12 months after her successful transplant. Hib vaccine is not recommended for adults with HIV infection. Preventive Services / Frequency Ages 19 to 39 years  Blood pressure check.** / Every 3-5 years.  Lipid and cholesterol check.** / Every 5 years beginning at age 20.  Clinical breast exam.** / Every 3 years for women in their 20s and 30s.  BRCA-related cancer risk assessment.** / For women who have family members with a BRCA-related cancer (breast, ovarian, tubal, or peritoneal cancers).  Pap test.** / Every 2 years from ages 21 through 29. Every 3 years starting at age 30 through age 65 or 70 with a history of 3 consecutive normal Pap tests.  HPV screening.** / Every 3 years from ages 30 through ages 65 to 70 with a history of 3 consecutive normal Pap tests.  Hepatitis C blood test.** / For any individual with known risks for hepatitis C.  Skin self-exam. / Monthly.  Influenza vaccine. / Every year.  Tetanus, diphtheria, and acellular pertussis (Tdap, Td) vaccine.** / Consult your health care provider. Pregnant women should receive 1 dose of Tdap vaccine during each pregnancy. 1 dose of Td every 10 years.  Varicella vaccine.** / Consult your health care provider. Pregnant females who do not have evidence of immunity should receive the first dose after pregnancy.  HPV vaccine. / 3 doses over 6 months, if 26 and younger. The vaccine is not recommended for use in pregnant females. However, pregnancy testing is not needed before receiving a dose.  Measles, mumps, rubella (MMR) vaccine.** / You need at least 1 dose of MMR if you were born in 1957 or later. You may also need a 2nd dose. For females of childbearing age, rubella immunity should be determined. If there is no evidence of immunity, females   who are not pregnant should be vaccinated. If there is no  evidence of immunity, females who are pregnant should delay immunization until after pregnancy.  Pneumococcal 13-valent conjugate (PCV13) vaccine.** / Consult your health care provider.  Pneumococcal polysaccharide (PPSV23) vaccine.** / 1 to 2 doses if you smoke cigarettes or if you have certain conditions.  Meningococcal vaccine.** / 1 dose if you are age 19 to 21 years and a first-year college student living in a residence hall, or have one of several medical conditions, you need to get vaccinated against meningococcal disease. You may also need additional booster doses.  Hepatitis A vaccine.** / Consult your health care provider.  Hepatitis B vaccine.** / Consult your health care provider.  Haemophilus influenzae type b (Hib) vaccine.** / Consult your health care provider. Ages 40 to 64 years  Blood pressure check.** / Every year.  Lipid and cholesterol check.** / Every 5 years beginning at age 20 years.  Lung cancer screening. / Every year if you are aged 55-80 years and have a 30-pack-year history of smoking and currently smoke or have quit within the past 15 years. Yearly screening is stopped once you have quit smoking for at least 15 years or develop a health problem that would prevent you from having lung cancer treatment.  Clinical breast exam.** / Every year after age 40 years.  BRCA-related cancer risk assessment.** / For women who have family members with a BRCA-related cancer (breast, ovarian, tubal, or peritoneal cancers).  Mammogram.** / Every year beginning at age 40 years and continuing for as long as you are in good health. Consult with your health care provider.  Pap test.** / Every 3 years starting at age 30 years through age 65 or 70 years with a history of 3 consecutive normal Pap tests.  HPV screening.** / Every 3 years from ages 30 years through ages 65 to 70 years with a history of 3 consecutive normal Pap tests.  Fecal occult blood test (FOBT) of stool. /  Every year beginning at age 50 years and continuing until age 75 years. You may not need to do this test if you get a colonoscopy every 10 years.  Flexible sigmoidoscopy or colonoscopy.** / Every 5 years for a flexible sigmoidoscopy or every 10 years for a colonoscopy beginning at age 50 years and continuing until age 75 years.  Hepatitis C blood test.** / For all people born from 1945 through 1965 and any individual with known risks for hepatitis C.  Skin self-exam. / Monthly.  Influenza vaccine. / Every year.  Tetanus, diphtheria, and acellular pertussis (Tdap/Td) vaccine.** / Consult your health care provider. Pregnant women should receive 1 dose of Tdap vaccine during each pregnancy. 1 dose of Td every 10 years.  Varicella vaccine.** / Consult your health care provider. Pregnant females who do not have evidence of immunity should receive the first dose after pregnancy.  Zoster vaccine.** / 1 dose for adults aged 60 years or older.  Measles, mumps, rubella (MMR) vaccine.** / You need at least 1 dose of MMR if you were born in 1957 or later. You may also need a second dose. For females of childbearing age, rubella immunity should be determined. If there is no evidence of immunity, females who are not pregnant should be vaccinated. If there is no evidence of immunity, females who are pregnant should delay immunization until after pregnancy.  Pneumococcal 13-valent conjugate (PCV13) vaccine.** / Consult your health care provider.  Pneumococcal polysaccharide (PPSV23) vaccine.** / 1   to 2 doses if you smoke cigarettes or if you have certain conditions.  Meningococcal vaccine.** / Consult your health care provider.  Hepatitis A vaccine.** / Consult your health care provider.  Hepatitis B vaccine.** / Consult your health care provider.  Haemophilus influenzae type b (Hib) vaccine.** / Consult your health care provider. Ages 33 years and over  Blood pressure check.** / Every year.  Lipid  and cholesterol check.** / Every 5 years beginning at age 25 years.  Lung cancer screening. / Every year if you are aged 67-80 years and have a 30-pack-year history of smoking and currently smoke or have quit within the past 15 years. Yearly screening is stopped once you have quit smoking for at least 15 years or develop a health problem that would prevent you from having lung cancer treatment.  Clinical breast exam.** / Every year after age 39 years.  BRCA-related cancer risk assessment.** / For women who have family members with a BRCA-related cancer (breast, ovarian, tubal, or peritoneal cancers).  Mammogram.** / Every year beginning at age 56 years and continuing for as long as you are in good health. Consult with your health care provider.  Pap test.** / Every 3 years starting at age 22 years through age 74 or 48 years with 3 consecutive normal Pap tests. Testing can be stopped between 65 and 70 years with 3 consecutive normal Pap tests and no abnormal Pap or HPV tests in the past 10 years.  HPV screening.** / Every 3 years from ages 69 years through ages 76 or 69 years with a history of 3 consecutive normal Pap tests. Testing can be stopped between 65 and 70 years with 3 consecutive normal Pap tests and no abnormal Pap or HPV tests in the past 10 years.  Fecal occult blood test (FOBT) of stool. / Every year beginning at age 43 years and continuing until age 59 years. You may not need to do this test if you get a colonoscopy every 10 years.  Flexible sigmoidoscopy or colonoscopy.** / Every 5 years for a flexible sigmoidoscopy or every 10 years for a colonoscopy beginning at age 78 years and continuing until age 88 years.  Hepatitis C blood test.** / For all people born from [birth year] through 1965 and any individual with known risks for hepatitis C.  Osteoporosis screening.** / A one-time screening for women ages 71 years and over and women at risk for fractures or osteoporosis.  Skin  self-exam. / Monthly.  Influenza vaccine. / Every year.  Tetanus, diphtheria, and acellular pertussis (Tdap/Td) vaccine.** / 1 dose of Td every 10 years.  Varicella vaccine.** / Consult your health care provider.  Zoster vaccine.** / 1 dose for adults aged 62 years or older.  Pneumococcal 13-valent conjugate (PCV13) vaccine.** / Consult your health care provider.  Pneumococcal polysaccharide (PPSV23) vaccine.** / 1 dose for all adults aged 32 years and older.  Meningococcal vaccine.** / Consult your health care provider.  Hepatitis A vaccine.** / Consult your health care provider.  Hepatitis B vaccine.** / Consult your health care provider.  Haemophilus influenzae type b (Hib) vaccine.** / Consult your health care provider. ** Family history and personal history of risk and conditions may change your health care provider's recommendations.   This information is not intended to replace advice given to you by your health care provider. Make sure you discuss any questions you have with your health care provider.   Document Released: 08/06/2001 Document Revised: 07/01/2014 Document Reviewed: 11/05/2010 Elsevier Interactive  Patient Education 2016 [employer].

## 2015-08-18 NOTE — Assessment & Plan Note (Signed)
Encouraged heart healthy diet, increase exercise, avoid trans fats, consider a krill oil cap daily 

## 2015-08-18 NOTE — Assessment & Plan Note (Signed)
Well controlled, no changes to meds. Encouraged heart healthy diet such as the DASH diet and exercise as tolerated.  °

## 2015-08-18 NOTE — Assessment & Plan Note (Signed)
Avoid offending foods, start probiotics. Do not eat large meals in late evening and consider raising head of bed.  

## 2015-08-27 NOTE — Progress Notes (Signed)
[name], female   DOB: Apr 27, 1956, 60 y.o.   MRN: [id number]   Subjective:    [name], female    DOB: 02-28-1956, 60 y.o.   MRN: [id number]  Chief Complaint  Patient presents with  . Annual Exam    HPI Patient is in today for annual exam. Feels well today. No recent illness. Denies any recent hospitalization. Is staying active and trying to maintain a heart healthy diet. Denies CP/palp/SOB/HA/congestion/fevers/GI or GU c/o. Taking meds as prescribed  Past Medical History  Diagnosis Date  . Osteoarthritis     knees  . Hypoglycemia   . Visual disturbance     visual flashes, referred to [facility] (Normal funduscopic exam)  . Allergic rhinitis   . Snoring 2008    negative sleep study of OSA  . Colon polyps   . Hot flashes   . Facial lesion 06/2010    removed from right side of face--precancerous  . GERD (gastroesophageal reflux disease)   . Hypertension   . Adenomatous colon polyp   . Preventative health care 03/07/2014  . Breast abscess 09/22/2014    recurrent  . Depression     Past Surgical History  Procedure Laterality Date  . Abdominal hysterectomy  2002  . Cervical fusion  06/15/09    right sided C6-C7 radiculopathy status post anterior cervical diskectomy and fusion at the C5-6 and C6-7 levels. Phylliss Bob, MD  . Carpal tunnel release  05/2010, 06/2010    bilateral--Dr Tamera Punt  . Colonoscopy    . Surgery on left ankle for fracture    . Left wrist surgery      Feb.19th 2016  . Dorsal compartment release Left 02/06/2015    Procedure: DEQUERVAIN'S RELEASE;  Surgeon: Milly Jakob, MD;  Location: [place];  Service: Orthopedics;  Laterality: Left;  . Hardware removal Left 02/06/2015    Procedure: LEFT WRIST REMOVAL OF HARDWARE;  Surgeon: Milly Jakob, MD;  Location: [place];  Service: Orthopedics;  Laterality: Left;  . Spine surgery  5 +/- yrs ago    Neck vertebrae fusion  .  Fracture surgery  08/12/2014 & 02/06/2015    Original break in Feb & add'l surg in Aug    Family History  Problem Relation Age of Onset  . Stroke Mother   . Colon cancer Mother   . Hypertension Mother   . Hyperlipidemia Mother   . Alcohol abuse Father   . Arthritis Mother     OA and rheumatoid  . Arthritis Maternal Grandmother     RA and OA  . Cancer Mother     colon  . Diabetes Maternal Grandmother   . Diabetes Paternal Grandfather   . Heart disease Mother     MI 35  . Heart disease Father   . Heart disease Maternal Grandmother   . Heart disease Maternal Grandfather   . Hypertension Father   . Hypertension Maternal Grandmother   . Hypertension Maternal Grandfather   . Stroke Maternal Grandmother     Social History   Social History  . Marital Status: Married    Spouse Name: N/A  . Number of Children: N/A  . Years of Education: N/A   Occupational History  . Primary school teacher for [employer] History Main Topics  . 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 Smokeless tobacco: Never Used  . Alcohol Use: No  . Drug Use: No  . Sexual  Activity: Yes     Comment: lives with husband, no dietary restrictions   Other Topics Concern  . Not on file   Social History Narrative    Outpatient Prescriptions Prior to Visit  Medication Sig Dispense Refill  . aspirin 81 MG EC tablet Take 81 mg by mouth daily.      Marland Kitchen estradiol (CLIMARA - DOSED IN MG/24 HR) 0.025 mg/24hr patch Place 1 patch (0.025 mg total) onto the skin once a week. 12 patch 2  . fexofenadine (ALLEGRA) 180 MG tablet Take 180 mg by mouth at bedtime as needed.     . Glucosamine-Chondroitin-Vit D3 1500-1200-800 MG-MG-UNIT PACK Take 1 tablet by mouth 2 (two) times daily.      . mometasone (NASONEX) 50 MCG/ACT nasal spray Place 2 sprays into both nostrils daily as needed.     . Soy Isoflavones 40 MG TABS Take 1 tablet by mouth 2 (two) times daily.      Marland Kitchen gabapentin (NEURONTIN) 300 MG capsule Take 1  capsule (300 mg total) by mouth 2 (two) times daily. 180 capsule 2  . olmesartan-hydrochlorothiazide (BENICAR HCT) 40-12.5 MG per tablet Take 1 tablet by mouth daily. 90 tablet 2  . omeprazole (PRILOSEC) 20 MG capsule Take 2 capsules (40 mg total) by mouth daily. 180 capsule 2  . venlafaxine XR (EFFEXOR-XR) 75 MG 24 hr capsule Take 1 capsule (75 mg total) by mouth daily with breakfast. 90 capsule 2  . HYDROcodone-acetaminophen (NORCO) 5-325 MG per tablet Take 1-2 tablets by mouth every 4 (four) hours as needed. (Patient not taking: Reported on 08/17/2015) 40 tablet 0   No facility-administered medications prior to visit.    No Known Allergies  Review of Systems  Constitutional: Negative for fever, chills and malaise/fatigue.  HENT: Negative for congestion and hearing loss.   Eyes: Negative for discharge.  Respiratory: Negative for cough, sputum production and shortness of breath.   Cardiovascular: Negative for chest pain, palpitations and leg swelling.  Gastrointestinal: Negative for heartburn, nausea, vomiting, abdominal pain, diarrhea, constipation and blood in stool.  Genitourinary: Negative for dysuria, urgency, frequency and hematuria.  Musculoskeletal: Negative for myalgias, back pain and falls.  Skin: Negative for rash.  Neurological: Negative for dizziness, sensory change, loss of consciousness, weakness and headaches.  Endo/Heme/Allergies: Negative for environmental allergies. Does not bruise/bleed easily.  Psychiatric/Behavioral: Negative for depression and suicidal ideas. The patient is not nervous/anxious and does not have insomnia.        Objective:    Physical Exam  Constitutional: She is oriented to person, place, and time. She appears well-developed and well-nourished. No distress.  HENT:  Head: Normocephalic and atraumatic.  Eyes: Conjunctivae are normal.  Neck: Neck supple. No thyromegaly present.  Cardiovascular: Normal rate, regular rhythm and normal heart  sounds.   No murmur heard. Pulmonary/Chest: Effort normal and breath sounds normal. No respiratory distress.  Abdominal: Soft. Bowel sounds are normal. She exhibits no distension and no mass. There is no tenderness.  Musculoskeletal: She exhibits no edema.  Lymphadenopathy:    She has no cervical adenopathy.  Neurological: She is alert and oriented to person, place, and time.  Skin: Skin is warm and dry.  Psychiatric: She has a normal mood and affect. Her behavior is normal.    BP 114/74 mmHg  Pulse 78  Temp(Src) 98 F (36.7 C) (Oral)  Ht 5\' 4"  (1.626 m)  Wt 213 lb 6 oz (96.786 kg)  BMI 36.61 kg/m2  SpO2 97% Wt Readings from Last 3  Encounters:  08/18/15 213 lb 6 oz (96.786 kg)  02/03/15 205 lb (92.987 kg)  09/09/14 201 lb 6 oz (91.343 kg)     Lab Results  Component Value Date   WBC 3.5* 03/03/2015   HGB 14.2 03/03/2015   HCT 41.8 03/03/2015   PLT 207.0 03/03/2015   GLUCOSE 100* 03/03/2015   CHOL 213* 03/03/2015   TRIG 177.0* 03/03/2015   HDL 43.20 03/03/2015   LDLDIRECT 158.1 09/07/2010   LDLCALC 134* 03/03/2015   ALT 17 03/03/2015   AST 17 03/03/2015   NA 144 03/03/2015   K 4.4 03/03/2015   CL 105 03/03/2015   CREATININE 0.99 03/03/2015   BUN 17 03/03/2015   CO2 33* 03/03/2015   TSH 1.59 03/03/2015   INR 0.97 06/13/2009   HGBA1C 5.2 02/07/2014    Lab Results  Component Value Date   TSH 1.59 03/03/2015   Lab Results  Component Value Date   WBC 3.5* 03/03/2015   HGB 14.2 03/03/2015   HCT 41.8 03/03/2015   MCV 87.9 03/03/2015   PLT 207.0 03/03/2015   Lab Results  Component Value Date   NA 144 03/03/2015   K 4.4 03/03/2015   CO2 33* 03/03/2015   GLUCOSE 100* 03/03/2015   BUN 17 03/03/2015   CREATININE 0.99 03/03/2015   BILITOT 0.5 03/03/2015   ALKPHOS 94 03/03/2015   AST 17 03/03/2015   ALT 17 03/03/2015   PROT 6.9 03/03/2015   ALBUMIN 4.0 03/03/2015   CALCIUM 9.6 03/03/2015   GFR 60.98 03/03/2015   Lab Results  Component Value Date    CHOL 213* 03/03/2015   Lab Results  Component Value Date   HDL 43.20 03/03/2015   Lab Results  Component Value Date   LDLCALC 134* 03/03/2015   Lab Results  Component Value Date   TRIG 177.0* 03/03/2015   Lab Results  Component Value Date   CHOLHDL 5 03/03/2015   Lab Results  Component Value Date   HGBA1C 5.2 02/07/2014       Assessment & Plan:   Problem List Items Addressed This Visit    Anxiety and depression    Doing well on current dose      Essential hypertension    Well controlled, no changes to meds. Encouraged heart healthy diet such as the DASH diet and exercise as tolerated.       Relevant Medications   venlafaxine XR (EFFEXOR-XR) 75 MG 24 hr capsule   omeprazole (PRILOSEC) 20 MG capsule   olmesartan-hydrochlorothiazide (BENICAR HCT) 40-12.5 MG tablet   gabapentin (NEURONTIN) 300 MG capsule   Other Relevant Orders   TSH   CBC   Comprehensive metabolic panel   Lipid panel   RESOLVED: GERD (gastroesophageal reflux disease)    Avoid offending foods, start probiotics. Do not eat large meals in late evening and consider raising head of bed.       Relevant Medications   venlafaxine XR (EFFEXOR-XR) 75 MG 24 hr capsule   omeprazole (PRILOSEC) 20 MG capsule   Hyperlipidemia, mixed    Encouraged heart healthy diet, increase exercise, avoid trans fats, consider a krill oil cap daily      Relevant Medications   olmesartan-hydrochlorothiazide (BENICAR HCT) 40-12.5 MG tablet   Other Relevant Orders   TSH   CBC   Comprehensive metabolic panel   Lipid panel   Knee pain, acute   Relevant Medications   venlafaxine XR (EFFEXOR-XR) 75 MG 24 hr capsule   omeprazole (PRILOSEC) 20 MG capsule  olmesartan-hydrochlorothiazide (BENICAR HCT) 40-12.5 MG tablet   gabapentin (NEURONTIN) 300 MG capsule   Obesity    Encouraged DASH diet, decrease po intake and increase exercise as tolerated. Needs 7-8 hours of sleep nightly. Avoid trans fats, eat small, frequent meals  every 4-5 hours with lean proteins, complex carbs and healthy fats. Minimize simple carbs      Post-menopause   Relevant Medications   venlafaxine XR (EFFEXOR-XR) 75 MG 24 hr capsule   omeprazole (PRILOSEC) 20 MG capsule   olmesartan-hydrochlorothiazide (BENICAR HCT) 40-12.5 MG tablet   gabapentin (NEURONTIN) 300 MG capsule   Preventative health care    Patient encouraged to maintain heart healthy diet, regular exercise, adequate sleep. Consider daily probiotics. Take medications as prescribed       Other Visit Diagnoses    Need for prophylactic vaccination and inoculation against influenza    -  Primary    Relevant Medications    venlafaxine XR (EFFEXOR-XR) 75 MG 24 hr capsule    omeprazole (PRILOSEC) 20 MG capsule    olmesartan-hydrochlorothiazide (BENICAR HCT) 40-12.5 MG tablet    gabapentin (NEURONTIN) 300 MG capsule    Depression with anxiety        Relevant Medications    venlafaxine XR (EFFEXOR-XR) 75 MG 24 hr capsule    omeprazole (PRILOSEC) 20 MG capsule    Myalgia        Relevant Medications    olmesartan-hydrochlorothiazide (BENICAR HCT) 40-12.5 MG tablet    gabapentin (NEURONTIN) 300 MG capsule    Encounter for immunization        Relevant Orders    Flu Vaccine QUAD 36+ mos IM       I have discontinued Ms. Goodley's HYDROcodone-acetaminophen. I have also changed her olmesartan-hydrochlorothiazide. Additionally, I am having her maintain her fexofenadine, Glucosamine-Chondroitin-Vit D3, mometasone, Soy Isoflavones, aspirin, estradiol, venlafaxine XR, omeprazole, and gabapentin.  Meds ordered this encounter  Medications  . venlafaxine XR (EFFEXOR-XR) 75 MG 24 hr capsule    Sig: Take 1 capsule (75 mg total) by mouth daily with breakfast.    Dispense:  90 capsule    Refill:  2  . omeprazole (PRILOSEC) 20 MG capsule    Sig: Take 2 capsules (40 mg total) by mouth daily.    Dispense:  180 capsule    Refill:  2  . olmesartan-hydrochlorothiazide (BENICAR HCT) 40-12.5  MG tablet    Sig: Take 1 tablet by mouth daily.    Dispense:  90 tablet    Refill:  2  . gabapentin (NEURONTIN) 300 MG capsule    Sig: Take 1 capsule (300 mg total) by mouth 2 (two) times daily.    Dispense:  180 capsule    Refill:  2     Penni Homans, MD

## 2016-01-15 ENCOUNTER — Encounter: Payer: Self-pay | Admitting: Gastroenterology

## 2016-02-20 ENCOUNTER — Other Ambulatory Visit: Payer: Self-pay | Admitting: Obstetrics & Gynecology

## 2016-02-20 LAB — HM MAMMOGRAPHY

## 2016-02-20 LAB — HM PAP SMEAR: HM Pap smear: NEGATIVE

## 2016-02-22 LAB — CYTOLOGY - PAP

## 2016-02-29 LAB — HM PAP SMEAR: HM Pap smear: NORMAL

## 2016-03-20 ENCOUNTER — Encounter: Payer: Self-pay | Admitting: Family Medicine

## 2016-03-20 ENCOUNTER — Other Ambulatory Visit: Payer: Self-pay | Admitting: Family Medicine

## 2016-03-20 DIAGNOSIS — Z23 Encounter for immunization: Secondary | ICD-10-CM

## 2016-03-20 DIAGNOSIS — K219 Gastro-esophageal reflux disease without esophagitis: Secondary | ICD-10-CM

## 2016-03-20 DIAGNOSIS — M25562 Pain in left knee: Secondary | ICD-10-CM

## 2016-03-20 DIAGNOSIS — F418 Other specified anxiety disorders: Secondary | ICD-10-CM

## 2016-03-20 DIAGNOSIS — M791 Myalgia, unspecified site: Secondary | ICD-10-CM

## 2016-03-20 DIAGNOSIS — Z780 Asymptomatic menopausal state: Secondary | ICD-10-CM

## 2016-03-20 DIAGNOSIS — I10 Essential (primary) hypertension: Secondary | ICD-10-CM

## 2016-03-20 MED ORDER — OMEPRAZOLE 20 MG PO CPDR: 40.0000 mg | DELAYED_RELEASE_CAPSULE | Freq: Every day | ORAL | 1 refills | Status: DC

## 2016-03-20 MED ORDER — OLMESARTAN MEDOXOMIL-HCTZ 40-12.5 MG PO TABS: 1.0000 | ORAL_TABLET | Freq: Every day | ORAL | 1 refills | Status: DC

## 2016-03-20 MED ORDER — VENLAFAXINE HCL ER 75 MG PO CP24: 75.0000 mg | ORAL_CAPSULE | Freq: Every day | ORAL | 1 refills | Status: DC

## 2016-03-20 MED ORDER — GABAPENTIN 300 MG PO CAPS: 300.0000 mg | ORAL_CAPSULE | Freq: Two times a day (BID) | ORAL | 1 refills | Status: DC

## 2016-07-19 ENCOUNTER — Encounter: Payer: Self-pay | Admitting: Family Medicine

## 2016-08-07 ENCOUNTER — Other Ambulatory Visit (INDEPENDENT_AMBULATORY_CARE_PROVIDER_SITE_OTHER): Payer: Managed Care, Other (non HMO)

## 2016-08-07 DIAGNOSIS — E782 Mixed hyperlipidemia: Secondary | ICD-10-CM | POA: Diagnosis not present

## 2016-08-07 DIAGNOSIS — I10 Essential (primary) hypertension: Secondary | ICD-10-CM

## 2016-08-07 LAB — LIPID PANEL
CHOLESTEROL: 210 mg/dL — AB (ref 0–200)
HDL: 49.2 mg/dL (ref >39.00)
LDL Cholesterol: 133 mg/dL — ABNORMAL HIGH (ref 0–99)
NonHDL: 160.83
Total CHOL/HDL Ratio: 4
Triglycerides: 139 mg/dL (ref 0.0–149.0)
VLDL: 27.8 mg/dL (ref 0.0–40.0)

## 2016-08-07 LAB — CBC
HEMATOCRIT: 41 % (ref 36.0–46.0)
Hemoglobin: 13.8 g/dL (ref 12.0–15.0)
MCHC: 33.6 g/dL (ref 30.0–36.0)
MCV: 88.8 fl (ref 78.0–100.0)
Platelets: 186 10*3/uL (ref 150.0–400.0)
RBC: 4.62 Mil/uL (ref 3.87–5.11)
RDW: 13 % (ref 11.5–15.5)
WBC: 4.1 10*3/uL (ref 4.0–10.5)

## 2016-08-07 LAB — COMPREHENSIVE METABOLIC PANEL
ALBUMIN: 4.2 g/dL (ref 3.5–5.2)
ALK PHOS: 88 U/L (ref 39–117)
ALT: 16 U/L (ref 0–35)
AST: 16 U/L (ref 0–37)
BUN: 18 mg/dL (ref 6–23)
CO2: 31 mEq/L (ref 19–32)
CREATININE: 0.95 mg/dL (ref 0.40–1.20)
Calcium: 9.2 mg/dL (ref 8.4–10.5)
Chloride: 103 mEq/L (ref 96–112)
GFR: 63.64 mL/min (ref >60.00)
Glucose, Bld: 114 mg/dL — ABNORMAL HIGH (ref 70–99)
POTASSIUM: 3.9 meq/L (ref 3.5–5.1)
SODIUM: 139 meq/L (ref 135–145)
TOTAL PROTEIN: 6.9 g/dL (ref 6.0–8.3)
Total Bilirubin: 0.4 mg/dL (ref 0.2–1.2)

## 2016-08-07 LAB — TSH: TSH: 1.57 u[IU]/mL (ref 0.35–4.50)

## 2016-08-08 ENCOUNTER — Other Ambulatory Visit (INDEPENDENT_AMBULATORY_CARE_PROVIDER_SITE_OTHER): Payer: Managed Care, Other (non HMO)

## 2016-08-08 DIAGNOSIS — R739 Hyperglycemia, unspecified: Secondary | ICD-10-CM | POA: Diagnosis not present

## 2016-08-08 LAB — HEMOGLOBIN A1C: Hgb A1c MFr Bld: 5.2 % (ref 4.6–6.5)

## 2016-08-12 ENCOUNTER — Other Ambulatory Visit: Payer: Managed Care, Other (non HMO)

## 2016-08-16 ENCOUNTER — Encounter: Payer: Self-pay | Admitting: Family Medicine

## 2016-08-16 ENCOUNTER — Ambulatory Visit (INDEPENDENT_AMBULATORY_CARE_PROVIDER_SITE_OTHER): Payer: Managed Care, Other (non HMO) | Admitting: Family Medicine

## 2016-08-16 VITALS — BP 116/76 | HR 80 | Temp 98.7°F | Resp 18 | Wt 212.6 lb

## 2016-08-16 DIAGNOSIS — E1065 Type 1 diabetes mellitus with hyperglycemia: Secondary | ICD-10-CM | POA: Diagnosis not present

## 2016-08-16 DIAGNOSIS — Z780 Asymptomatic menopausal state: Secondary | ICD-10-CM

## 2016-08-16 DIAGNOSIS — Z1211 Encounter for screening for malignant neoplasm of colon: Secondary | ICD-10-CM

## 2016-08-16 DIAGNOSIS — K219 Gastro-esophageal reflux disease without esophagitis: Secondary | ICD-10-CM

## 2016-08-16 DIAGNOSIS — E6609 Other obesity due to excess calories: Secondary | ICD-10-CM

## 2016-08-16 DIAGNOSIS — E782 Mixed hyperlipidemia: Secondary | ICD-10-CM

## 2016-08-16 DIAGNOSIS — M791 Myalgia, unspecified site: Secondary | ICD-10-CM

## 2016-08-16 DIAGNOSIS — F418 Other specified anxiety disorders: Secondary | ICD-10-CM

## 2016-08-16 DIAGNOSIS — I10 Essential (primary) hypertension: Secondary | ICD-10-CM

## 2016-08-16 DIAGNOSIS — Z23 Encounter for immunization: Secondary | ICD-10-CM

## 2016-08-16 DIAGNOSIS — Z800 Family history of malignant neoplasm of digestive organs: Secondary | ICD-10-CM | POA: Diagnosis not present

## 2016-08-16 DIAGNOSIS — M779 Enthesopathy, unspecified: Secondary | ICD-10-CM | POA: Diagnosis not present

## 2016-08-16 HISTORY — DX: Enthesopathy, unspecified: M77.9

## 2016-08-16 MED ORDER — VENLAFAXINE HCL ER 75 MG PO CP24: 75.0000 mg | ORAL_CAPSULE | Freq: Every day | ORAL | 1 refills | Status: DC

## 2016-08-16 MED ORDER — OLMESARTAN MEDOXOMIL-HCTZ 40-12.5 MG PO TABS: 1.0000 | ORAL_TABLET | Freq: Every day | ORAL | 1 refills | Status: DC

## 2016-08-16 MED ORDER — GABAPENTIN 300 MG PO CAPS: 300.0000 mg | ORAL_CAPSULE | Freq: Two times a day (BID) | ORAL | 1 refills | Status: DC

## 2016-08-16 NOTE — Progress Notes (Signed)
Pre visit review using our clinic review tool, if applicable. No additional management support is needed unless otherwise documented below in the visit note. 

## 2016-08-16 NOTE — Patient Instructions (Signed)
NOW probiotic caps 1 cap daily, at [employer], [employer]  Cholelithiasis Cholelithiasis is also called "gallstones." It is a kind of gallbladder disease. The gallbladder is an organ that stores a liquid (bile) that helps you digest fat. Gallstones may not cause symptoms (may be silent gallstones) until they cause a blockage, and then they can cause pain (gallbladder attack). Follow these instructions at home:  Take over-the-counter and prescription medicines only as told by your doctor.  Stay at a healthy weight.  Eat healthy foods. This includes:  Eating fewer fatty foods, like fried foods.  Eating fewer refined carbs (refined carbohydrates). Refined carbs are breads and grains that are highly processed, like white bread and white rice. Instead, choose whole grains like whole-wheat bread and brown rice.  Eating more fiber. Almonds, fresh fruit, and beans are healthy sources of fiber.  Keep all follow-up visits as told by your doctor. This is important. Contact a doctor if:  You have sudden pain in the upper right side of your belly (abdomen). Pain might spread to your right shoulder or your chest. This may be a sign of a gallbladder attack.  You feel sick to your stomach (are nauseous).  You throw up (vomit).  You have been diagnosed with gallstones that have no symptoms and you get:  Belly pain.  Discomfort, burning, or fullness in the upper part of your belly (indigestion). Get help right away if:  You have sudden pain in the upper right side of your belly, and it lasts for more than 2 hours.  You have belly pain that lasts for more than 5 hours.  You have a fever or chills.  You keep feeling sick to your stomach or you keep throwing up.  Your skin or the whites of your eyes turn yellow (jaundice).  You have dark-colored pee (urine).  You have light-colored poop (stool). Summary  Cholelithiasis is also called "gallstones."  The gallbladder is an organ that  stores a liquid (bile) that helps you digest fat.  Silent gallstones are gallstones that do not cause symptoms.  A gallbladder attack may cause sudden pain in the upper right side of your belly. Pain might spread to your right shoulder or your chest. If this happens, contact your doctor.  If you have sudden pain in the upper right side of your belly that lasts for more than 2 hours, get help right away. This information is not intended to replace advice given to you by your health care provider. Make sure you discuss any questions you have with your health care provider. Document Released: 11/27/2007 Document Revised: 02/25/2016 Document Reviewed: 02/25/2016 Elsevier Interactive Patient Education  2017 [employer].

## 2016-08-16 NOTE — Assessment & Plan Note (Signed)
Encouraged DASH diet, decrease po intake and increase exercise as tolerated. Needs 7-8 hours of sleep nightly. Avoid trans fats, eat small, frequent meals every 4-5 hours with lean proteins, complex carbs and healthy fats. Minimize simple carbs, referred to bariatrics 

## 2016-08-16 NOTE — Assessment & Plan Note (Signed)
Achilles on left. Seeing Dr Novella Olive and having therapy , add lidocaine gel prn

## 2016-08-16 NOTE — Assessment & Plan Note (Signed)
Well controlled, no changes to meds. Encouraged heart healthy diet such as the DASH diet and exercise as tolerated.  °

## 2016-08-19 ENCOUNTER — Encounter: Payer: Managed Care, Other (non HMO) | Admitting: Family Medicine

## 2016-08-20 ENCOUNTER — Encounter: Payer: Self-pay | Admitting: Family Medicine

## 2016-08-25 NOTE — Progress Notes (Signed)
[name], female   DOB: [birth year], 61 y.o.   MRN: [id number]   Subjective:    [name], female    DOB: September 13, 1955, 61 y.o.   MRN: [id number]  Chief Complaint  Patient presents with  . Follow-up    HPI  Patient is in today for follow up on hypertension, hyperlipidemia, reflux and more. She feels well today. No recent febrile illness or acute concerns. No hospitalizations. Has noted some infrequent heart burn but no abdominal pain. Denies CP/palp/SOB/HA/congestion/fevers or GU c/o. Taking meds as prescribed  Patient Care Team: Mosie Lukes, MD as PCP - General (Family Medicine) Vania Rea, MD as Consulting Physician (Obstetrics and Gynecology)   Past Medical History:  Diagnosis Date  . Adenomatous colon polyp   . Allergic rhinitis   . Breast abscess 09/22/2014   recurrent  . Colon polyps   . Depression   . Facial lesion 06/2010   removed from right side of face--precancerous  . GERD (gastroesophageal reflux disease)   . Hot flashes   . Hypertension   . Hypoglycemia   . Osteoarthritis    knees  . Preventative health care 03/07/2014  . Snoring 2008   negative sleep study of OSA  . Tendonitis 08/16/2016  . Visual disturbance    visual flashes, referred to [facility] (Normal funduscopic exam)    Past Surgical History:  Procedure Laterality Date  . ABDOMINAL HYSTERECTOMY  2002  . CARPAL TUNNEL RELEASE  05/2010, 06/2010   bilateral--Dr Tamera Punt  . CERVICAL FUSION  06/15/09   right sided C6-C7 radiculopathy status post anterior cervical diskectomy and fusion at the C5-6 and C6-7 levels. Phylliss Bob, MD  . COLONOSCOPY    . DORSAL COMPARTMENT RELEASE Left 02/06/2015   Procedure: DEQUERVAIN'S RELEASE;  Surgeon: Milly Jakob, MD;  Location: [facility];  Service: Orthopedics;  Laterality: Left;  . FRACTURE SURGERY  08/12/2014 & 02/06/2015   Original break in Feb & add'l surg in Aug  . HARDWARE REMOVAL Left  02/06/2015   Procedure: LEFT WRIST REMOVAL OF HARDWARE;  Surgeon: Milly Jakob, MD;  Location: [place];  Service: Orthopedics;  Laterality: Left;  . Left wrist surgery     Feb.19th 2016  . SPINE SURGERY  5 +/- yrs ago   Neck vertebrae fusion  . Surgery on left ankle for fracture      Family History  Problem Relation Age of Onset  . Stroke Mother   . Colon cancer Mother   . Hypertension Mother   . Hyperlipidemia Mother   . Alcohol abuse Father   . Arthritis Mother     OA and rheumatoid  . Arthritis Maternal Grandmother     RA and OA  . Cancer Mother     colon  . Diabetes Maternal Grandmother   . Diabetes Paternal Grandfather   . Heart disease Mother     MI 82  . Heart disease Father   . Heart disease Maternal Grandmother   . Heart disease Maternal Grandfather   . Hypertension Father   . Hypertension Maternal Grandmother   . Hypertension Maternal Grandfather   . Stroke Maternal Grandmother     Social History   Social History  . Marital status: Married    Spouse name: N/A  . Number of children: N/A  . Years of education: N/A   Occupational History  . Primary school teacher for [employer] History Main Topics  .  Smoking status: Never Smoker  . Smokeless tobacco: Never Used  . Alcohol use No  . Drug use: No  . Sexual activity: Yes     Comment: lives with husband, no dietary restrictions   Other Topics Concern  . Not on file   Social History Narrative  . No narrative on file    Outpatient Medications Prior to Visit  Medication Sig Dispense Refill  . aspirin 81 MG EC tablet Take 81 mg by mouth daily.      Marland Kitchen estradiol (CLIMARA - DOSED IN MG/24 HR) 0.025 mg/24hr patch Place 1 patch (0.025 mg total) onto the skin once a week. 12 patch 2  . fexofenadine (ALLEGRA) 180 MG tablet Take 180 mg by mouth at bedtime as needed.     . Glucosamine-Chondroitin-Vit D3 1500-1200-800 MG-MG-UNIT PACK Take 1 tablet by mouth 2 (two) times  daily.      . mometasone (NASONEX) 50 MCG/ACT nasal spray Place 2 sprays into both nostrils daily as needed.     Marland Kitchen omeprazole (PRILOSEC) 20 MG capsule Take 2 capsules (40 mg total) by mouth daily. 180 capsule 1  . Soy Isoflavones 40 MG TABS Take 1 tablet by mouth 2 (two) times daily.      Marland Kitchen gabapentin (NEURONTIN) 300 MG capsule Take 1 capsule (300 mg total) by mouth 2 (two) times daily. 180 capsule 1  . olmesartan-hydrochlorothiazide (BENICAR HCT) 40-12.5 MG tablet Take 1 tablet by mouth daily. 90 tablet 1  . venlafaxine XR (EFFEXOR-XR) 75 MG 24 hr capsule Take 1 capsule (75 mg total) by mouth daily with breakfast. 90 capsule 1   No facility-administered medications prior to visit.     No Known Allergies  Review of Systems  Constitutional: Negative for fever and malaise/fatigue.  HENT: Negative for congestion.   Eyes: Negative for blurred vision.  Respiratory: Negative for shortness of breath.   Cardiovascular: Negative for chest pain, palpitations and leg swelling.  Gastrointestinal: Positive for heartburn. Negative for abdominal pain, blood in stool and nausea.  Genitourinary: Negative for dysuria and frequency.  Musculoskeletal: Negative for falls.  Skin: Negative for rash.  Neurological: Negative for dizziness, loss of consciousness and headaches.  Endo/Heme/Allergies: Negative for environmental allergies.  Psychiatric/Behavioral: Negative for depression. The patient is not nervous/anxious.        Objective:    Physical Exam  Constitutional: She is oriented to person, place, and time. She appears well-developed and well-nourished. No distress.  HENT:  Head: Normocephalic and atraumatic.  Nose: Nose normal.  Eyes: Right eye exhibits no discharge. Left eye exhibits no discharge.  Neck: Normal range of motion. Neck supple.  Cardiovascular: Normal rate and regular rhythm.   No murmur heard. Pulmonary/Chest: Effort normal and breath sounds normal.  Abdominal: Soft. Bowel  sounds are normal. There is no tenderness.  Musculoskeletal: She exhibits no edema.  Neurological: She is alert and oriented to person, place, and time.  Skin: Skin is warm and dry.  Psychiatric: She has a normal mood and affect.  Nursing note and vitals reviewed.   BP 116/76 (BP Location: Left Arm, Patient Position: Sitting, Cuff Size: Normal)   Pulse 80   Temp 98.7 F (37.1 C) (Oral)   Resp 18   Wt 212 lb 9.6 oz (96.4 kg)   SpO2 99%   BMI 36.49 kg/m  Wt Readings from Last 3 Encounters:  08/16/16 212 lb 9.6 oz (96.4 kg)  08/18/15 213 lb 6 oz (96.8 kg)  02/03/15 205 lb (93 kg)  Lab Results  Component Value Date   WBC 4.1 08/07/2016   HGB 13.8 08/07/2016   HCT 41.0 08/07/2016   PLT 186.0 08/07/2016   GLUCOSE 114 (H) 08/07/2016   CHOL 210 (H) 08/07/2016   TRIG 139.0 08/07/2016   HDL 49.20 08/07/2016   LDLDIRECT 158.1 09/07/2010   LDLCALC 133 (H) 08/07/2016   ALT 16 08/07/2016   AST 16 08/07/2016   NA 139 08/07/2016   K 3.9 08/07/2016   CL 103 08/07/2016   CREATININE 0.95 08/07/2016   BUN 18 08/07/2016   CO2 31 08/07/2016   TSH 1.57 08/07/2016   INR 0.97 06/13/2009   HGBA1C 5.2 08/08/2016    Lab Results  Component Value Date   TSH 1.57 08/07/2016   Lab Results  Component Value Date   WBC 4.1 08/07/2016   HGB 13.8 08/07/2016   HCT 41.0 08/07/2016   MCV 88.8 08/07/2016   PLT 186.0 08/07/2016   Lab Results  Component Value Date   NA 139 08/07/2016   K 3.9 08/07/2016   CO2 31 08/07/2016   GLUCOSE 114 (H) 08/07/2016   BUN 18 08/07/2016   CREATININE 0.95 08/07/2016   BILITOT 0.4 08/07/2016   ALKPHOS 88 08/07/2016   AST 16 08/07/2016   ALT 16 08/07/2016   PROT 6.9 08/07/2016   ALBUMIN 4.2 08/07/2016   CALCIUM 9.2 08/07/2016   GFR 63.64 08/07/2016   Lab Results  Component Value Date   CHOL 210 (H) 08/07/2016   Lab Results  Component Value Date   HDL 49.20 08/07/2016   Lab Results  Component Value Date   LDLCALC 133 (H) 08/07/2016   Lab  Results  Component Value Date   TRIG 139.0 08/07/2016   Lab Results  Component Value Date   CHOLHDL 4 08/07/2016   Lab Results  Component Value Date   HGBA1C 5.2 08/08/2016       Assessment & Plan:   Problem List Items Addressed This Visit    Essential hypertension    Well controlled, no changes to meds. Encouraged heart healthy diet such as the DASH diet and exercise as tolerated.       Relevant Medications   olmesartan-hydrochlorothiazide (BENICAR HCT) 40-12.5 MG tablet   gabapentin (NEURONTIN) 300 MG capsule   venlafaxine XR (EFFEXOR-XR) 75 MG 24 hr capsule   Other Relevant Orders   Hemoglobin A1c   CBC   Comprehensive metabolic panel   TSH   GERD (gastroesophageal reflux disease)    Avoid offending foods, start probiotics. Do not eat large meals in late evening and consider raising head of bed.       Relevant Medications   Probiotic Product (PROBIOTIC ADVANCED PO)   venlafaxine XR (EFFEXOR-XR) 75 MG 24 hr capsule   Hyperlipidemia, mixed    Encouraged heart healthy diet, increase exercise, avoid trans fats, consider a krill oil cap daily      Relevant Medications   olmesartan-hydrochlorothiazide (BENICAR HCT) 40-12.5 MG tablet   Other Relevant Orders   Lipid panel   Obesity    Encouraged DASH diet, decrease po intake and increase exercise as tolerated. Needs 7-8 hours of sleep nightly. Avoid trans fats, eat small, frequent meals every 4-5 hours with lean proteins, complex carbs and healthy fats. Minimize simple carbs, referred to bariatrics      Post-menopause   Relevant Medications   olmesartan-hydrochlorothiazide (BENICAR HCT) 40-12.5 MG tablet   gabapentin (NEURONTIN) 300 MG capsule   venlafaxine XR (EFFEXOR-XR) 75 MG 24 hr capsule  Tendonitis    Achilles on left. Seeing Dr Novella Olive and having therapy , add lidocaine gel prn       Other Visit Diagnoses    FH: colon cancer    -  Primary   Relevant Orders   Ambulatory referral to Gastroenterology    Need for prophylactic vaccination and inoculation against influenza       Relevant Medications   olmesartan-hydrochlorothiazide (BENICAR HCT) 40-12.5 MG tablet   gabapentin (NEURONTIN) 300 MG capsule   venlafaxine XR (EFFEXOR-XR) 75 MG 24 hr capsule   Myalgia       Relevant Medications   olmesartan-hydrochlorothiazide (BENICAR HCT) 40-12.5 MG tablet   gabapentin (NEURONTIN) 300 MG capsule   Depression with anxiety       Relevant Medications   venlafaxine XR (EFFEXOR-XR) 75 MG 24 hr capsule   Screening for colon cancer       Relevant Orders   Ambulatory referral to Gastroenterology   Type 1 diabetes mellitus with hyperglycemia (HCC)       Relevant Medications   olmesartan-hydrochlorothiazide (BENICAR HCT) 40-12.5 MG tablet   Other Relevant Orders   Hemoglobin A1c      I am having Ms. Goeden maintain her fexofenadine, Glucosamine-Chondroitin-Vit D3, mometasone, Soy Isoflavones, aspirin, estradiol, omeprazole, KRILL OIL PO, Probiotic Product (PROBIOTIC ADVANCED PO), olmesartan-hydrochlorothiazide, gabapentin, and venlafaxine XR.  Meds ordered this encounter  Medications  . KRILL OIL PO    Sig: Take by mouth.  . Probiotic Product (PROBIOTIC ADVANCED PO)    Sig: Take by mouth.  . olmesartan-hydrochlorothiazide (BENICAR HCT) 40-12.5 MG tablet    Sig: Take 1 tablet by mouth daily.    Dispense:  90 tablet    Refill:  1  . gabapentin (NEURONTIN) 300 MG capsule    Sig: Take 1 capsule (300 mg total) by mouth 2 (two) times daily.    Dispense:  180 capsule    Refill:  1  . venlafaxine XR (EFFEXOR-XR) 75 MG 24 hr capsule    Sig: Take 1 capsule (75 mg total) by mouth daily with breakfast.    Dispense:  90 capsule    Refill:  1    Penni Homans, MD

## 2016-08-25 NOTE — Assessment & Plan Note (Signed)
Avoid offending foods, start probiotics. Do not eat large meals in late evening and consider raising head of bed.  

## 2016-08-25 NOTE — Assessment & Plan Note (Signed)
Encouraged heart healthy diet, increase exercise, avoid trans fats, consider a krill oil cap daily 

## 2016-09-12 ENCOUNTER — Encounter: Payer: Self-pay | Admitting: Family Medicine

## 2016-09-12 ENCOUNTER — Other Ambulatory Visit: Payer: Self-pay | Admitting: Family Medicine

## 2016-09-12 DIAGNOSIS — R1011 Right upper quadrant pain: Secondary | ICD-10-CM

## 2016-09-13 ENCOUNTER — Encounter: Payer: Self-pay | Admitting: Family Medicine

## 2016-09-16 ENCOUNTER — Ambulatory Visit (HOSPITAL_BASED_OUTPATIENT_CLINIC_OR_DEPARTMENT_OTHER)
Admission: RE | Admit: 2016-09-16 | Discharge: 2016-09-16 | Disposition: A | Discharge status: Home / Self Care | Payer: Managed Care, Other (non HMO) | Source: Ambulatory Visit | Attending: Family Medicine | Admitting: Family Medicine

## 2016-09-16 DIAGNOSIS — R932 Abnormal findings on diagnostic imaging of liver and biliary tract: Secondary | ICD-10-CM | POA: Diagnosis not present

## 2016-09-16 DIAGNOSIS — R1011 Right upper quadrant pain: Secondary | ICD-10-CM | POA: Diagnosis not present

## 2016-09-17 ENCOUNTER — Other Ambulatory Visit: Payer: Self-pay | Admitting: Family Medicine

## 2016-09-17 DIAGNOSIS — K760 Fatty (change of) liver, not elsewhere classified: Secondary | ICD-10-CM

## 2016-09-17 DIAGNOSIS — K8020 Calculus of gallbladder without cholecystitis without obstruction: Secondary | ICD-10-CM

## 2016-09-23 ENCOUNTER — Encounter: Payer: Self-pay | Admitting: Family Medicine

## 2016-10-04 ENCOUNTER — Ambulatory Visit: Payer: Self-pay | Admitting: General Surgery

## 2016-10-07 ENCOUNTER — Encounter (HOSPITAL_COMMUNITY): Payer: Self-pay

## 2016-10-07 NOTE — Patient Instructions (Addendum)
Symphanie Cederberg Tuckett  10/07/2016   Your procedure is scheduled on: 10/10/16  Report to [facility] Main  Entrance Take [place]  elevators to 3rd floor to  [place] at     [phone].    Call this number if you have problems the morning of surgery [phone]    Remember: ONLY 1 PERSON MAY GO WITH YOU TO SHORT STAY TO GET  READY MORNING OF YOUR SURGERY.  Do not eat food or drink liquids :After Midnight.     Take these medicines the morning of surgery with A SIP OF WATER: gabapentin, venlafaxine(effexor)                                You may not have any metal on your body including hair pins and              piercings  Do not wear jewelry, make-up, lotions, powders or perfumes, deodorant             Do not wear nail polish.  Do not shave  48 hours prior to surgery.                 Do not bring valuables to the hospital. [place].  Contacts, dentures or bridgework may not be worn into surgery.  Leave suitcase in the car. After surgery it may be brought to your room.     Patients discharged the day of surgery will not be allowed to drive home.  Name and phone number of your driver:  Special Instructions: N/A              Please read over the following fact sheets you were given: _____________________________________________________________________             [facility] - Preparing for Surgery Before surgery, you can play an important role.  Because skin is not sterile, your skin needs to be as free of germs as possible.  You can reduce the number of germs on your skin by washing with CHG (chlorahexidine gluconate) soap before surgery.  CHG is an antiseptic cleaner which kills germs and bonds with the skin to continue killing germs even after washing. Please DO NOT use if you have an allergy to CHG or antibacterial soaps.  If your skin becomes reddened/irritated stop using the CHG and inform your nurse  when you arrive at Short Stay. Do not shave (including legs and underarms) for at least 48 hours prior to the first CHG shower.  You may shave your face/neck. Please follow these instructions carefully:  1.  Shower with CHG Soap the night before surgery and the  morning of Surgery.  2.  If you choose to wash your hair, wash your hair first as usual with your  normal  shampoo.  3.  After you shampoo, rinse your hair and body thoroughly to remove the  shampoo.                           4.  Use CHG as you would any other liquid soap.  You can apply chg directly  to the skin and wash  Gently with a scrungie or clean washcloth.  5.  Apply the CHG Soap to your body ONLY FROM THE NECK DOWN.   Do not use on face/ open                           Wound or open sores. Avoid contact with eyes, ears mouth and genitals (private parts).                       Wash face,  Genitals (private parts) with your normal soap.             6.  Wash thoroughly, paying special attention to the area where your surgery  will be performed.  7.  Thoroughly rinse your body with warm water from the neck down.  8.  DO NOT shower/wash with your normal soap after using and rinsing off  the CHG Soap.                9.  Pat yourself dry with a clean towel.            10.  Wear clean pajamas.            11.  Place clean sheets on your bed the night of your first shower and do not  sleep with pets. Day of Surgery : Do not apply any lotions/deodorants the morning of surgery.  Please wear clean clothes to the hospital/surgery center.  FAILURE TO FOLLOW THESE INSTRUCTIONS MAY RESULT IN THE CANCELLATION OF YOUR SURGERY PATIENT SIGNATURE_________________________________  NURSE SIGNATURE__________________________________  ________________________________________________________________________

## 2016-10-09 ENCOUNTER — Encounter (HOSPITAL_COMMUNITY): Payer: Self-pay

## 2016-10-09 ENCOUNTER — Encounter (HOSPITAL_COMMUNITY)
Admission: RE | Admit: 2016-10-09 | Discharge: 2016-10-09 | Disposition: A | Discharge status: Home / Self Care | Payer: Managed Care, Other (non HMO) | Source: Ambulatory Visit | Attending: General Surgery | Admitting: General Surgery

## 2016-10-09 DIAGNOSIS — I10 Essential (primary) hypertension: Secondary | ICD-10-CM | POA: Diagnosis not present

## 2016-10-09 DIAGNOSIS — K219 Gastro-esophageal reflux disease without esophagitis: Secondary | ICD-10-CM | POA: Diagnosis not present

## 2016-10-09 DIAGNOSIS — M1990 Unspecified osteoarthritis, unspecified site: Secondary | ICD-10-CM | POA: Diagnosis not present

## 2016-10-09 DIAGNOSIS — Z6835 Body mass index (BMI) 35.0-35.9, adult: Secondary | ICD-10-CM | POA: Diagnosis not present

## 2016-10-09 DIAGNOSIS — K8010 Calculus of gallbladder with chronic cholecystitis without obstruction: Secondary | ICD-10-CM | POA: Diagnosis present

## 2016-10-09 DIAGNOSIS — Z90710 Acquired absence of both cervix and uterus: Secondary | ICD-10-CM | POA: Diagnosis not present

## 2016-10-09 DIAGNOSIS — E669 Obesity, unspecified: Secondary | ICD-10-CM | POA: Diagnosis not present

## 2016-10-09 DIAGNOSIS — Z7982 Long term (current) use of aspirin: Secondary | ICD-10-CM | POA: Diagnosis not present

## 2016-10-09 LAB — BASIC METABOLIC PANEL
ANION GAP: 9 (ref 5–15)
BUN: 17 mg/dL (ref 6–20)
CHLORIDE: 104 mmol/L (ref 101–111)
CO2: 27 mmol/L (ref 22–32)
Calcium: 9.1 mg/dL (ref 8.9–10.3)
Creatinine, Ser: 1.24 mg/dL — ABNORMAL HIGH (ref 0.44–1.00)
GFR calc non Af Amer: 46 mL/min — ABNORMAL LOW (ref >60)
GFR, EST AFRICAN AMERICAN: 54 mL/min — AB (ref >60)
GLUCOSE: 60 mg/dL — AB (ref 65–99)
Potassium: 4.1 mmol/L (ref 3.5–5.1)
SODIUM: 140 mmol/L (ref 135–145)

## 2016-10-09 LAB — CBC
HEMATOCRIT: 39.5 % (ref 36.0–46.0)
HEMOGLOBIN: 13.5 g/dL (ref 12.0–15.0)
MCH: 29.2 pg (ref 26.0–34.0)
MCHC: 34.2 g/dL (ref 30.0–36.0)
MCV: 85.3 fL (ref 78.0–100.0)
Platelets: 204 10*3/uL (ref 150–400)
RBC: 4.63 MIL/uL (ref 3.87–5.11)
RDW: 12.4 % (ref 11.5–15.5)
WBC: 4.5 10*3/uL (ref 4.0–10.5)

## 2016-10-09 NOTE — Progress Notes (Deleted)
LOV 10/01/16 Dr. Ernie Hew in  chart

## 2016-10-09 NOTE — Progress Notes (Addendum)
Tried to call Julie Gray to make sure she felt ok and had eaten something . Her  BS was 60 per her BMP result . No answer. Will try again ! Tried to call again still no answer. LVM telling pt. That her BS was a little low at 60  From her preop BBMP result. LVM to eat something with protein before midnight  To avoid her BS dropping to low.

## 2016-10-09 NOTE — Progress Notes (Addendum)
BMP  Done 10/09/16 routed to Dr. Marlou Starks via epic.

## 2016-10-09 NOTE — Progress Notes (Signed)
hgb a1c 08/08/16 5.2 epic

## 2016-10-10 ENCOUNTER — Encounter (HOSPITAL_COMMUNITY)
Admission: RE | Disposition: A | Discharge status: Home / Self Care | Payer: Self-pay | Source: Ambulatory Visit | Attending: General Surgery

## 2016-10-10 ENCOUNTER — Ambulatory Visit (HOSPITAL_COMMUNITY): Payer: Managed Care, Other (non HMO) | Admitting: Certified Registered Nurse Anesthetist

## 2016-10-10 ENCOUNTER — Ambulatory Visit (HOSPITAL_COMMUNITY)
Admission: RE | Admit: 2016-10-10 | Discharge: 2016-10-10 | Disposition: A | Discharge status: Home / Self Care | Payer: Managed Care, Other (non HMO) | Source: Ambulatory Visit | Attending: General Surgery | Admitting: General Surgery

## 2016-10-10 ENCOUNTER — Ambulatory Visit (HOSPITAL_COMMUNITY): Payer: Managed Care, Other (non HMO)

## 2016-10-10 ENCOUNTER — Encounter (HOSPITAL_COMMUNITY): Payer: Self-pay | Admitting: Certified Registered Nurse Anesthetist

## 2016-10-10 DIAGNOSIS — E669 Obesity, unspecified: Secondary | ICD-10-CM | POA: Insufficient documentation

## 2016-10-10 DIAGNOSIS — Z90710 Acquired absence of both cervix and uterus: Secondary | ICD-10-CM | POA: Insufficient documentation

## 2016-10-10 DIAGNOSIS — K8010 Calculus of gallbladder with chronic cholecystitis without obstruction: Secondary | ICD-10-CM | POA: Insufficient documentation

## 2016-10-10 DIAGNOSIS — K219 Gastro-esophageal reflux disease without esophagitis: Secondary | ICD-10-CM | POA: Insufficient documentation

## 2016-10-10 DIAGNOSIS — Z7982 Long term (current) use of aspirin: Secondary | ICD-10-CM | POA: Insufficient documentation

## 2016-10-10 DIAGNOSIS — Z6835 Body mass index (BMI) 35.0-35.9, adult: Secondary | ICD-10-CM | POA: Insufficient documentation

## 2016-10-10 DIAGNOSIS — Z419 Encounter for procedure for purposes other than remedying health state, unspecified: Secondary | ICD-10-CM

## 2016-10-10 DIAGNOSIS — M1990 Unspecified osteoarthritis, unspecified site: Secondary | ICD-10-CM | POA: Insufficient documentation

## 2016-10-10 DIAGNOSIS — I10 Essential (primary) hypertension: Secondary | ICD-10-CM | POA: Insufficient documentation

## 2016-10-10 HISTORY — PX: CHOLECYSTECTOMY: SHX55

## 2016-10-10 SURGERY — LAPAROSCOPIC CHOLECYSTECTOMY WITH INTRAOPERATIVE CHOLANGIOGRAM
Anesthesia: General

## 2016-10-10 MED ORDER — KETAMINE HCL 10 MG/ML IJ SOLN
INTRAMUSCULAR | Status: AC
  Filled 2016-10-10: qty 1

## 2016-10-10 MED ORDER — ACETAMINOPHEN 500 MG PO TABS
1000.0000 mg | ORAL_TABLET | ORAL | Status: AC
  Administered 2016-10-10: 1000 mg via ORAL
  Filled 2016-10-10: qty 2

## 2016-10-10 MED ORDER — FENTANYL CITRATE (PF) 100 MCG/2ML IJ SOLN
INTRAMUSCULAR | Status: DC | PRN
  Administered 2016-10-10: 100 ug via INTRAVENOUS
  Administered 2016-10-10 (×2): 50 ug via INTRAVENOUS

## 2016-10-10 MED ORDER — PHENYLEPHRINE 40 MCG/ML (10ML) SYRINGE FOR IV PUSH (FOR BLOOD PRESSURE SUPPORT)
PREFILLED_SYRINGE | INTRAVENOUS | Status: AC
  Filled 2016-10-10: qty 10

## 2016-10-10 MED ORDER — PROPOFOL 10 MG/ML IV BOLUS
INTRAVENOUS | Status: AC
  Filled 2016-10-10: qty 40

## 2016-10-10 MED ORDER — BUPIVACAINE HCL (PF) 0.25 % IJ SOLN
INTRAMUSCULAR | Status: AC
  Filled 2016-10-10: qty 30

## 2016-10-10 MED ORDER — CELECOXIB 200 MG PO CAPS
400.0000 mg | ORAL_CAPSULE | ORAL | Status: AC
  Administered 2016-10-10: 400 mg via ORAL
  Filled 2016-10-10: qty 2

## 2016-10-10 MED ORDER — LIDOCAINE HCL (CARDIAC) 20 MG/ML IV SOLN
INTRAVENOUS | Status: DC | PRN
  Administered 2016-10-10: 100 mg via INTRAVENOUS

## 2016-10-10 MED ORDER — DEXAMETHASONE SODIUM PHOSPHATE 10 MG/ML IJ SOLN
INTRAMUSCULAR | Status: DC | PRN
  Administered 2016-10-10: 10 mg via INTRAVENOUS

## 2016-10-10 MED ORDER — ONDANSETRON HCL 4 MG/2ML IJ SOLN
INTRAMUSCULAR | Status: DC | PRN
  Administered 2016-10-10: 8 mg via INTRAVENOUS

## 2016-10-10 MED ORDER — CHLORHEXIDINE GLUCONATE CLOTH 2 % EX PADS: 6.0000 | MEDICATED_PAD | Freq: Once | CUTANEOUS | Status: DC

## 2016-10-10 MED ORDER — PHENYLEPHRINE HCL 10 MG/ML IJ SOLN
INTRAMUSCULAR | Status: DC | PRN
  Administered 2016-10-10: 80 ug via INTRAVENOUS

## 2016-10-10 MED ORDER — HYDROCODONE-ACETAMINOPHEN 5-325 MG PO TABS: 1.0000 | ORAL_TABLET | ORAL | 0 refills | Status: DC | PRN

## 2016-10-10 MED ORDER — ROCURONIUM BROMIDE 50 MG/5ML IV SOSY
PREFILLED_SYRINGE | INTRAVENOUS | Status: AC
  Filled 2016-10-10: qty 5

## 2016-10-10 MED ORDER — FENTANYL CITRATE (PF) 100 MCG/2ML IJ SOLN
INTRAMUSCULAR | Status: AC
  Filled 2016-10-10: qty 2

## 2016-10-10 MED ORDER — BUPIVACAINE HCL (PF) 0.25 % IJ SOLN
INTRAMUSCULAR | Status: DC | PRN
  Administered 2016-10-10: 25 mL

## 2016-10-10 MED ORDER — ROCURONIUM BROMIDE 100 MG/10ML IV SOLN
INTRAVENOUS | Status: DC | PRN
  Administered 2016-10-10: 40 mg via INTRAVENOUS
  Administered 2016-10-10 (×2): 10 mg via INTRAVENOUS

## 2016-10-10 MED ORDER — PROPOFOL 10 MG/ML IV BOLUS
INTRAVENOUS | Status: DC | PRN
  Administered 2016-10-10: 50 mg via INTRAVENOUS
  Administered 2016-10-10: 180 mg via INTRAVENOUS

## 2016-10-10 MED ORDER — SODIUM CHLORIDE 0.9 % IR SOLN
Status: DC | PRN
  Administered 2016-10-10: 1000 mL

## 2016-10-10 MED ORDER — GABAPENTIN 300 MG PO CAPS
300.0000 mg | ORAL_CAPSULE | ORAL | Status: DC
  Filled 2016-10-10: qty 1

## 2016-10-10 MED ORDER — CEFAZOLIN SODIUM-DEXTROSE 2-4 GM/100ML-% IV SOLN
2.0000 g | INTRAVENOUS | Status: AC
  Administered 2016-10-10: 2 g via INTRAVENOUS
  Filled 2016-10-10: qty 100

## 2016-10-10 MED ORDER — IOPAMIDOL (ISOVUE-300) INJECTION 61%
INTRAVENOUS | Status: AC
  Filled 2016-10-10: qty 50

## 2016-10-10 MED ORDER — KETAMINE HCL 10 MG/ML IJ SOLN
INTRAMUSCULAR | Status: DC | PRN
  Administered 2016-10-10: 10 mg via INTRAVENOUS

## 2016-10-10 MED ORDER — LIDOCAINE 2% (20 MG/ML) 5 ML SYRINGE
INTRAMUSCULAR | Status: AC
  Filled 2016-10-10: qty 5

## 2016-10-10 MED ORDER — LACTATED RINGERS IV SOLN
INTRAVENOUS | Status: DC | PRN
  Administered 2016-10-10: 1000 mL via INTRAVENOUS

## 2016-10-10 MED ORDER — SUGAMMADEX SODIUM 200 MG/2ML IV SOLN
INTRAVENOUS | Status: AC
Start: 2016-10-10 — End: 2016-10-10
  Filled 2016-10-10: qty 2

## 2016-10-10 MED ORDER — SUGAMMADEX SODIUM 200 MG/2ML IV SOLN
INTRAVENOUS | Status: DC | PRN
  Administered 2016-10-10: 250 mg via INTRAVENOUS

## 2016-10-10 MED ORDER — DEXAMETHASONE SODIUM PHOSPHATE 10 MG/ML IJ SOLN
INTRAMUSCULAR | Status: AC
  Filled 2016-10-10: qty 1

## 2016-10-10 MED ORDER — ONDANSETRON HCL 4 MG/2ML IJ SOLN
INTRAMUSCULAR | Status: AC
  Filled 2016-10-10: qty 4

## 2016-10-10 MED ORDER — LACTATED RINGERS IV SOLN
INTRAVENOUS | Status: DC
  Administered 2016-10-10 (×2): via INTRAVENOUS

## 2016-10-10 MED ORDER — EPHEDRINE 5 MG/ML INJ
INTRAVENOUS | Status: AC
  Filled 2016-10-10: qty 10

## 2016-10-10 MED ORDER — LACTATED RINGERS IV SOLN
INTRAVENOUS | Status: DC
  Administered 2016-10-10: 1000 mL via INTRAVENOUS

## 2016-10-10 MED ORDER — EPHEDRINE SULFATE 50 MG/ML IJ SOLN
INTRAMUSCULAR | Status: DC | PRN
  Administered 2016-10-10: 10 mg via INTRAVENOUS

## 2016-10-10 SURGICAL SUPPLY — 31 items
ADH SKN CLS APL DERMABOND .7 (GAUZE/BANDAGES/DRESSINGS) ×1
APPLIER CLIP 5 13 M/L LIGAMAX5 (MISCELLANEOUS) ×3
APR CLP MED LRG 5 ANG JAW (MISCELLANEOUS) ×1
BAG SPEC RTRVL LRG 6X4 10 (ENDOMECHANICALS) ×1
CABLE HIGH FREQUENCY MONO STRZ (ELECTRODE) ×3 IMPLANT
CATH REDDICK CHOLANGI 4FR 50CM (CATHETERS) ×3 IMPLANT
CHLORAPREP W/TINT 26ML (MISCELLANEOUS) ×3 IMPLANT
CLIP APPLIE 5 13 M/L LIGAMAX5 (MISCELLANEOUS) ×1 IMPLANT
COVER MAYO STAND STRL (DRAPES) ×3 IMPLANT
DECANTER SPIKE VIAL GLASS SM (MISCELLANEOUS) ×3 IMPLANT
DERMABOND ADVANCED (GAUZE/BANDAGES/DRESSINGS) ×2
DERMABOND ADVANCED .7 DNX12 (GAUZE/BANDAGES/DRESSINGS) ×1 IMPLANT
DRAPE C-ARM 42X120 X-RAY (DRAPES) ×3 IMPLANT
ELECT REM PT RETURN 15FT ADLT (MISCELLANEOUS) ×3 IMPLANT
GLOVE BIO SURGEON STRL SZ7.5 (GLOVE) ×3 IMPLANT
GOWN STRL REUS W/TWL XL LVL3 (GOWN DISPOSABLE) ×9 IMPLANT
HEMOSTAT SURGICEL 4X8 (HEMOSTASIS) IMPLANT
IRRIG SUCT STRYKERFLOW 2 WTIP (MISCELLANEOUS) ×3
IRRIGATION SUCT STRKRFLW 2 WTP (MISCELLANEOUS) ×1 IMPLANT
IV CATH 14GX2 1/4 (CATHETERS) ×3 IMPLANT
KIT BASIN OR (CUSTOM PROCEDURE TRAY) ×3 IMPLANT
POUCH SPECIMEN RETRIEVAL 10MM (ENDOMECHANICALS) ×3 IMPLANT
SCISSORS LAP 5X35 DISP (ENDOMECHANICALS) ×3 IMPLANT
SLEEVE XCEL OPT CAN 5 100 (ENDOMECHANICALS) ×6 IMPLANT
SUT MNCRL AB 4-0 PS2 18 (SUTURE) ×3 IMPLANT
TOWEL OR 17X26 10 PK STRL BLUE (TOWEL DISPOSABLE) ×3 IMPLANT
TOWEL OR NON WOVEN STRL DISP B (DISPOSABLE) ×3 IMPLANT
TRAY LAPAROSCOPIC (CUSTOM PROCEDURE TRAY) ×3 IMPLANT
TROCAR BLADELESS OPT 5 100 (ENDOMECHANICALS) ×3 IMPLANT
TROCAR XCEL BLUNT TIP 100MML (ENDOMECHANICALS) ×3 IMPLANT
TUBING INSUF HEATED (TUBING) ×3 IMPLANT

## 2016-10-10 NOTE — Anesthesia Preprocedure Evaluation (Addendum)
Anesthesia Evaluation  Patient identified by MRN, date of birth, ID band Patient awake    Reviewed: Allergy & Precautions, NPO status , Patient's Chart, lab work & pertinent test results  Airway Mallampati: II  TM Distance: >3 FB Neck ROM: Full    Dental no notable dental hx.    Pulmonary neg pulmonary ROS,    breath sounds clear to auscultation       Cardiovascular hypertension, Pt. on medications  Rhythm:Regular Rate:Normal     Neuro/Psych PSYCHIATRIC DISORDERS Depression  Neuromuscular disease    GI/Hepatic Neg liver ROS, GERD  Medicated,  Endo/Other  negative endocrine ROS  Renal/GU negative Renal ROS  negative genitourinary   Musculoskeletal  (+) Arthritis , Osteoarthritis,    Abdominal   Peds negative pediatric ROS (+)  Hematology negative hematology ROS (+)   Anesthesia Other Findings   Reproductive/Obstetrics negative OB ROS                            Lab Results  Component Value Date   WBC 4.5 10/09/2016   HGB 13.5 10/09/2016   HCT 39.5 10/09/2016   MCV 85.3 10/09/2016   PLT 204 10/09/2016   Lab Results  Component Value Date   CREATININE 1.24 (H) 10/09/2016   BUN 17 10/09/2016   NA 140 10/09/2016   K 4.1 10/09/2016   CL 104 10/09/2016   CO2 27 10/09/2016   Lab Results  Component Value Date   INR 0.97 06/13/2009   EKG: normal sinus rhythm.   Anesthesia Physical Anesthesia Plan  ASA: II  Anesthesia Plan: General   Post-op Pain Management:    Induction: Intravenous  Airway Management Planned: LMA  Additional Equipment:   Intra-op Plan:   Post-operative Plan: Extubation in OR  Informed Consent: I have reviewed the patients History and Physical, chart, labs and discussed the procedure including the risks, benefits and alternatives for the proposed anesthesia with the patient or authorized representative who has indicated his/her understanding and  acceptance.   Dental advisory given  Plan Discussed with: CRNA  Anesthesia Plan Comments:         Anesthesia Quick Evaluation

## 2016-10-10 NOTE — Op Note (Signed)
10/10/2016  12:32 PM  PATIENT:  Julie Gray  61 y.o. female  PRE-OPERATIVE DIAGNOSIS:  Gallstones  POST-OPERATIVE DIAGNOSIS:  Gallstones  PROCEDURE:  Procedure(s): LAPAROSCOPIC CHOLECYSTECTOMY (N/A)  SURGEON:  Surgeon(s) and Role:    * Jovita Kussmaul, MD - Primary  PHYSICIAN ASSISTANT:   ASSISTANTS: none   ANESTHESIA:   local and general  EBL:  Total I/O In: -  Out: 5 [Blood:5]  BLOOD ADMINISTERED:none  DRAINS: none   LOCAL MEDICATIONS USED:  MARCAINE     SPECIMEN:  Source of Specimen:  gallbladder  DISPOSITION OF SPECIMEN:  PATHOLOGY  COUNTS:  YES  TOURNIQUET:  * No tourniquets in log *  DICTATION: .Dragon Dictation   Procedure: After informed consent was obtained the patient was brought to the operating room and placed in the supine position on the operating room table. After adequate induction of general anesthesia the patient's abdomen was prepped with ChloraPrep allowed to dry and draped in usual sterile manner. An appropriate timeout was performed. The area below the umbilicus was infiltrated with quarter percent  Marcaine. A small incision was made with a 15 blade knife. The incision was carried down through the subcutaneous tissue bluntly with a hemostat and [employer] retractors. The linea alba was identified. The linea alba was incised with a 15 blade knife and each side was grasped with Coker clamps. The preperitoneal space was then probed with a hemostat until the peritoneum was opened and access was gained to the abdominal cavity. A 0 Vicryl pursestring stitch was placed in the fascia surrounding the opening. A Hassan cannula was then placed through the opening and anchored in place with the previously placed Vicryl purse string stitch. The abdomen was insufflated with carbon dioxide without difficulty. A laparoscope was inserted through the [facility] cannula in the right upper quadrant was inspected. Next the epigastric region was infiltrated with % Marcaine.  A small incision was made with a 15 blade knife. A 5 mm port was placed bluntly through this incision into the abdominal cavity under direct vision. Next 2 sites were chosen laterally on the right side of the abdomen for placement of 5 mm ports. Each of these areas was infiltrated with quarter percent Marcaine. Small stab incisions were made with a 15 blade knife. 5 mm ports were then placed bluntly through these incisions into the abdominal cavity under direct vision without difficulty. A blunt grasper was placed through the lateralmost 5 mm port and used to grasp the dome of the gallbladder and elevated anteriorly and superiorly. Another blunt grasper was placed through the other 5 mm port and used to retract the body and neck of the gallbladder. A dissector was placed through the epigastric port and using the electrocautery the peritoneal reflection at the gallbladder neck was opened. Blunt dissection was then carried out in this area until the gallbladder neck-cystic duct junction was readily identified and a good window was created. A single clip was placed on the gallbladder neck. A small  ductotomy was made just below the clip with laparoscopic scissors. A 14-gauge Angiocath was then placed through the anterior abdominal wall under direct vision. A Reddick cholangiogram catheter was then placed through the Angiocath and flushed. The angiocath could not be passed into the cystic duct because the lumen was too small. The anatomy was well visualized and we seemed to be safely away from the common duct.  Two clips were placed proximally on the cystic duct and the duct was divided between the 2  sets of clips. Posterior to this the cystic artery was identified and again dissected bluntly in a circumferential manner until a good window  was created. 2 clips were placed proximally and one distally on the artery and the artery was divided between the 2 sets of clips. Next a laparoscopic hook cautery device was used to  separate the gallbladder from the liver bed. Prior to completely detaching the gallbladder from the liver bed the liver bed was inspected and several small bleeding points were coagulated with the electrocautery until the area was completely hemostatic. The gallbladder was then detached the rest of it from the liver bed without difficulty. A laparoscopic bag was inserted through the hassan port. The laparoscope was moved to the epigastric port. The gallbladder was placed within the bag and the bag was sealed.  The bag with the gallbladder was then removed with the [facility] cannula through the infraumbilical port without difficulty. The fascial defect was then closed with the previously placed Vicryl pursestring stitch as well as with another figure-of-eight 0 Vicryl stitch. The liver bed was inspected again and found to be hemostatic. The abdomen was irrigated with copious amounts of saline until the effluent was clear. The ports were then removed under direct vision without difficulty and were found to be hemostatic. The gas was allowed to escape. The skin incisions were all closed with interrupted 4-0 Monocryl subcuticular stitches. Dermabond dressings were applied. The patient tolerated the procedure well. At the end of the case all needle sponge and instrument counts were correct. The patient was then awakened and taken to recovery in stable condition  PLAN OF CARE: Discharge to home after PACU  PATIENT DISPOSITION:  PACU - hemodynamically stable.   Delay start of Pharmacological VTE agent (>24hrs) due to surgical blood loss or risk of bleeding: not applicable

## 2016-10-10 NOTE — Anesthesia Postprocedure Evaluation (Addendum)
Anesthesia Post Note  Patient: Julie Gray  Procedure(s) Performed: Procedure(s) (LRB): LAPAROSCOPIC CHOLECYSTECTOMY (N/A)  Patient location during evaluation: PACU Anesthesia Type: General Level of consciousness: awake, oriented and sedated Pain management: pain level controlled Vital Signs Assessment: post-procedure vital signs reviewed and stable Respiratory status: spontaneous breathing, nonlabored ventilation, respiratory function stable and patient connected to nasal cannula oxygen Cardiovascular status: stable and blood pressure returned to baseline Postop Assessment: no signs of nausea or vomiting Anesthetic complications: no       Last Vitals:  Vitals:   10/10/16 1326 10/10/16 1349  BP: 127/68 125/74  Pulse: 73 85  Resp: 18 18  Temp: 36.7 C 36.7 C    Last Pain:  Vitals:   10/10/16 1349  TempSrc: Oral  PainSc:                  Merrie Epler,JAMES TERRILL

## 2016-10-10 NOTE — H&P (Signed)
Julie Gray  Location: [facility] Patient #: [id number] DOB: 01/18/1956 Married / Language: English / Race: White Female   History of Present Illness The patient is a 61 year old female who presents with abdominal pain. We are asked to see the patient in consultation by Dr. Penni Homans to evaluate her for gallstones. The patient is a 61 year old white female who has been complaining of right upper quadrant pain for the last 6 months. The pain seems to come and go but over the last week the pain has been more constant. She has had some occasional nausea associated with the pain. She has been having pretty regular diarrhea as well for the last 6 months. Her only history of abdominal surgery was a hysterectomy for endometriosis. She had a recent ultrasound that was significant for some small stones in the gallbladder but no gallbladder wall thickening or ductal dilatation. Her liver functions were normal.   Allergies No Known Drug Allergies  Medication History  Estradiol (0.025MG /24HR Patch Weekly, Transdermal) Active. Aspirin (81MG  Tablet, Oral) Active. Probiotic (Oral) Active. Gabapentin (300MG  Capsule, Oral) Active. Omeprazole (20MG  Capsule DR, Oral) Active. Soy Isoflavones (40MG  Tablet, Oral) Active. Glucosamine Chondroitin Complx (Oral) Active. Venlafaxine HCl (75MG  Tablet, Oral) Active. Benicar HCT (40-12.5MG  Tablet, Oral) Active. Krill Oil (Oral) Active. Allegra Allergy (60MG  Tablet, Oral) Active. Medications Reconciled    Review of Systems General Not Present- Appetite Loss, Chills, Fatigue, Fever, Night Sweats, Weight Gain and Weight Loss. Skin Not Present- Change in Wart/Mole, Dryness, Hives, Jaundice, New Lesions, Non-Healing Wounds, Rash and Ulcer. HEENT Not Present- Earache, Hearing Loss, Hoarseness, Nose Bleed, Oral Ulcers, Ringing in the Ears, Seasonal Allergies, Sinus Pain, Sore Throat, Visual Disturbances, Wears glasses/contact  lenses and Yellow Eyes. Respiratory Not Present- Bloody sputum, Chronic Cough, Difficulty Breathing, Snoring and Wheezing. Breast Not Present- Breast Mass, Breast Pain, Nipple Discharge and Skin Changes. Cardiovascular Not Present- Chest Pain, Difficulty Breathing Lying Down, Leg Cramps, Palpitations, Rapid Heart Rate, Shortness of Breath and Swelling of Extremities. Gastrointestinal Present- Abdominal Pain and Chronic diarrhea. Not Present- Bloating, Bloody Stool, Change in Bowel Habits, Constipation, Difficulty Swallowing, Excessive gas, Gets full quickly at meals, Hemorrhoids, Indigestion, Nausea, Rectal Pain and Vomiting. Female Genitourinary Not Present- Frequency, Nocturia, Painful Urination, Pelvic Pain and Urgency. Musculoskeletal Not Present- Back Pain, Joint Pain, Joint Stiffness, Muscle Pain, Muscle Weakness and Swelling of Extremities. Neurological Not Present- Decreased Memory, Fainting, Headaches, Numbness, Seizures, Tingling, Tremor, Trouble walking and Weakness. Psychiatric Not Present- Anxiety, Bipolar, Change in Sleep Pattern, Depression, Fearful and Frequent crying. Endocrine Not Present- Cold Intolerance, Excessive Hunger, Hair Changes, Heat Intolerance, Hot flashes and New Diabetes. Hematology Not Present- Easy Bruising, Excessive bleeding, Gland problems, HIV and Persistent Infections.  Vitals  Weight: 210 lb Height: 64in Body Surface Area: 2 m Body Mass Index: 36.05 kg/m  Temp.: 98.60F  Pulse: 67 (Regular)  BP: 112/70 (Sitting, Left Arm, Standard)       Physical Exam  General Mental Status-Alert. General Appearance-Consistent with stated age. Hydration-Well hydrated. Voice-Normal.  Head and Neck Head-normocephalic, atraumatic with no lesions or palpable masses. Trachea-midline. Thyroid Gland Characteristics - normal size and consistency.  Eye Eyeball - Bilateral-Extraocular movements intact. Sclera/Conjunctiva - Bilateral-No  scleral icterus.  Chest and Lung Exam Chest and lung exam reveals -quiet, even and easy respiratory effort with no use of accessory muscles and on auscultation, normal breath sounds, no adventitious sounds and normal vocal resonance. Inspection Chest Wall - Normal. Back - normal.  Cardiovascular Cardiovascular examination reveals -normal heart  sounds, regular rate and rhythm with no murmurs and normal pedal pulses bilaterally.  Abdomen Note: The abdomen is soft. There is some mild to moderate right upper quadrant tenderness. There is no palpable mass. There is good bowel sounds.   Neurologic Neurologic evaluation reveals -alert and oriented x 3 with no impairment of recent or remote memory. Mental Status-Normal.  Musculoskeletal Normal Exam - Left-Upper Extremity Strength Normal and Lower Extremity Strength Normal. Normal Exam - Right-Upper Extremity Strength Normal and Lower Extremity Strength Normal.  Lymphatic Head & Neck  General Head & Neck Lymphatics: Bilateral - Description - Normal. Axillary  General Axillary Region: Bilateral - Description - Normal. Tenderness - Non Tender. Femoral & Inguinal  Generalized Femoral & Inguinal Lymphatics: Bilateral - Description - Normal. Tenderness - Non Tender.    Assessment & Plan  GALLSTONES (K80.20) Impression: The patient appears to have symptomatic gallstones. Because the risk of further painful episodes and possible pancreatitis and think she would benefit from having her gallbladder removed. She would also like to have this done. I have discussed with her in detail the risks and benefits of the operation as well as some of the technical aspects and she understands and wishes to proceed Current Plans Pt Education - Gallstones: discussed with patient and provided information.

## 2016-10-10 NOTE — Transfer of Care (Signed)
Immediate Anesthesia Transfer of Care Note  Patient: Julie Gray  Procedure(s) Performed: Procedure(s): LAPAROSCOPIC CHOLECYSTECTOMY (N/A)  Patient Location: PACU  Anesthesia Type:General  Level of Consciousness:  sedated, patient cooperative and responds to stimulation  Airway & Oxygen Therapy:Patient Spontanous Breathing and Patient connected to face mask oxgen  Post-op Assessment:  Report given to PACU RN and Post -op Vital signs reviewed and stable  Post vital signs:  Reviewed and stable  Last Vitals:  Vitals:   10/10/16 0921 10/10/16 1249  BP: 117/89   Pulse: 74   Resp: 16   Temp: 37.1 C 96.7 C    Complications: No apparent anesthesia complications

## 2016-10-10 NOTE — Interval H&P Note (Signed)
History and Physical Interval Note:  10/10/2016 10:46 AM  Julie Gray  has presented today for surgery, with the diagnosis of Gallstones  The various methods of treatment have been discussed with the patient and family. After consideration of risks, benefits and other options for treatment, the patient has consented to  Procedure(s): LAPAROSCOPIC CHOLECYSTECTOMY WITH INTRAOPERATIVE CHOLANGIOGRAM (N/A) as a surgical intervention .  The patient's history has been reviewed, patient examined, no change in status, stable for surgery.  I have reviewed the patient's chart and labs.  Questions were answered to the patient's satisfaction.     TOTH III,Bellamarie Pflug S

## 2016-10-10 NOTE — Discharge Instructions (Signed)
PATIENT INSTRUCTIONS GALL BLADDER SURGERY (CHOLECYSTECTOMY)  .  Call your physician immediately if you have any fevers greater than 102.5, drainage from your wound that is not clear or looks infected, persistent bleeding, increasing abdominal pain, problems urinating, or persistent nausea/vomiting.  You should be aware that you may have right shoulder pain after surgery and that this will progressively go away.  This is called 'referred pain' and is from the area of the gallbladder.  It can also be caused by gas that may be trapped under the diaphragm from the surgery, especially if it was performed laparoscopically through mini-incisions.  This gas will progressively get reabsorbed by your body.   WOUND CARE INSTRUCTIONS:  Keep a dry clean dressing on the wound if there is drainage. The initial bandage may be removed after 24 hours.  Once the wound has quit draining you may leave it open to air.  If clothing rubs against the wound or causes irritation and the wound is not draining you may cover it with a dry dressing during the daytime.  Try to keep the wound dry and avoid ointments on the wound unless directed to do so.  If the wound becomes bright red and painful or starts to drain infected material that is not clear, please contact your physician immediately.   You should also call if you begin to drain fluid that is thin and greenish-brown from the wound and appears to look like bile.  If the wound though is mildly pink and has a thick firm ridge underneath it, this is normal, and is referred to as a healing ridge.  This will resolve over the next 4-6 weeks.  DIET:  You may eat any foods that you can tolerate.  It is a good idea to eat a high fiber diet and take in plenty of fluids to prevent constipation.  If you do become constipated you may want to take a mild laxative or take ducolax tablets on a daily basis until your bowel habits are regular.  Constipation can be very uncomfortable, along with  straining, after recent abdominal surgery.  ACTIVITY:  You are encouraged to cough and deep breath or use your incentive spirometer if you were given one, every 15-30 minutes when awake.  This will help prevent respiratory complications and low grade fevers post-operatively.  You may want to hug a pillow when coughing and sneezing to add additional support to the surgical area(s) which will decrease pain during these times.  You are encouraged to walk and engage in light activity for the next two weeks.  You should not lift more than 20 pounds during this time frame as it could put you at increased risk for a post-operative hernia.  Twenty pounds is roughly equivalent to a plastic bag of groceries.    MEDICATIONS:  Try to take narcotic medications and anti-inflammatory medications, such as tylenol, ibuprofen, naprosyn, etc., with food.  This will minimize stomach upset from the medication.  Should you develop nausea and vomiting from the pain medication, or develop a rash, please discontinue the medication and contact your physician.  You should not drive, make important decisions, or operate machinery when taking narcotic pain medication.  QUESTIONS:  Please feel free to call your physician or the hospital operator if you have any questions, and they will be glad to assist you.  Cholecystostomy, Care After Refer to this sheet in the next few weeks. These instructions provide you with information about caring for yourself after your procedure.  Your health care provider may also give you more specific instructions. Your treatment has been planned according to current medical practices, but problems sometimes occur. Call your health care provider if you have any problems or questions after your procedure. What can I expect after the procedure? After your procedure, it is common to have soreness near the site of your drainage tube (catheter) or your incision. Follow these instructions at home: Incision  care   Follow instructions from your health care provider about how to take care of your incision. Make sure you:  Wash your hands with soap and water before you change your bandage (dressing). If soap and water are not available, use hand sanitizer.  Change your dressing as told by your health care provider.  Leave stitches (sutures), skin glue, or adhesive strips in place. These skin closures may need to be in place for 2 weeks or longer. If adhesive strip edges start to loosen and curl up, you may trim the loose edges. Do not remove adhesive strips completely unless your health care provider tells you to do that.  Check your incision and your drainage site every day for signs of infection. Watch for:  Redness, swelling, or pain.  Fluid, blood, or pus. General instructions   If you were sent home with a surgical drain in place, follow instructions from your health care provider about how to care for your drain and collection bag at home.  Do not take baths, swim, or use a hot tub until your health care provider approves. Ask your health care provider if you can take showers. You may only be allowed to take sponge baths for bathing.  Follow instructions from your health care provider about what you may eat or drink.  Take over-the-counter and prescription medicines only as told by your health care provider.  Keep all follow-up visits as told by your health care provider. This is important. Contact a health care provider if:  You have redness, swelling, or pain at your incision or drainage site.  You have nausea or vomiting. Get help right away if:  Your abdominal pain gets worse.  You feel dizzy or you faint while standing.  You have fluid, blood, or pus coming from your incision or drainage site.  You have a fever.  You have shortness of breath.  You have a rapid heartbeat.  Your nausea or vomiting does not go away.  Your drainage tube becomes blocked.  Your  drainage tube comes out of your abdomen. This information is not intended to replace advice given to you by your health care provider. Make sure you discuss any questions you have with your health care provider. Document Released: 03/01/2015 Document Revised: 11/16/2015 Document Reviewed: 09/21/2014 Elsevier Interactive Patient Education  2017 [employer].

## 2016-10-10 NOTE — Anesthesia Procedure Notes (Signed)
Procedure Name: Intubation Date/Time: 10/10/2016 10:32 AM Performed by: Claudia Desanctis Pre-anesthesia Checklist: Patient identified, Emergency Drugs available, Suction available and Patient being monitored Patient Re-evaluated:Patient Re-evaluated prior to inductionOxygen Delivery Method: Circle system utilized Preoxygenation: Pre-oxygenation with 100% oxygen Intubation Type: IV induction Ventilation: Mask ventilation without difficulty and Oral airway inserted - appropriate to patient size Laryngoscope Size: Sabra Heck and 2 Grade View: Grade I Tube type: Oral Number of attempts: 1 Airway Equipment and Method: Stylet and Oral airway Placement Confirmation: ETT inserted through vocal cords under direct vision,  positive ETCO2 and breath sounds checked- equal and bilateral Secured at: 22 cm Tube secured with: Tape Dental Injury: Teeth and Oropharynx as per pre-operative assessment

## 2016-10-10 NOTE — Anesthesia Preprocedure Evaluation (Signed)
Anesthesia Evaluation  Patient identified by MRN, date of birth, ID band Patient awake    Reviewed: Allergy & Precautions, NPO status , Patient's Chart, lab work & pertinent test results  History of Anesthesia Complications Negative for: history of anesthetic complications  Airway Mallampati: II   Neck ROM: Full    Dental  (+) Teeth Intact   Pulmonary    breath sounds clear to auscultation       Cardiovascular hypertension,  Rhythm:Regular Rate:Normal     Neuro/Psych  Neuromuscular disease    GI/Hepatic GERD  ,  Endo/Other    Renal/GU      Musculoskeletal  (+) Arthritis ,   Abdominal (+) + obese,   Peds  Hematology   Anesthesia Other Findings   Reproductive/Obstetrics                             Anesthesia Physical Anesthesia Plan  ASA: II  Anesthesia Plan: General   Post-op Pain Management:    Induction: Intravenous  Airway Management Planned: Oral ETT  Additional Equipment:   Intra-op Plan:   Post-operative Plan: Extubation in OR  Informed Consent: I have reviewed the patients History and Physical, chart, labs and discussed the procedure including the risks, benefits and alternatives for the proposed anesthesia with the patient or authorized representative who has indicated his/her understanding and acceptance.   Dental advisory given  Plan Discussed with: CRNA  Anesthesia Plan Comments:         Anesthesia Quick Evaluation

## 2016-10-11 ENCOUNTER — Encounter (HOSPITAL_COMMUNITY): Payer: Self-pay | Admitting: General Surgery

## 2016-10-25 ENCOUNTER — Encounter: Payer: Self-pay | Admitting: Family Medicine

## 2016-11-22 NOTE — Addendum Note (Signed)
Addendum  created 11/22/16 1357 by Parthena Fergeson, MD   Sign clinical note    

## 2016-12-31 ENCOUNTER — Other Ambulatory Visit: Payer: Self-pay | Admitting: Family Medicine

## 2016-12-31 DIAGNOSIS — F418 Other specified anxiety disorders: Secondary | ICD-10-CM

## 2016-12-31 DIAGNOSIS — I10 Essential (primary) hypertension: Secondary | ICD-10-CM

## 2016-12-31 DIAGNOSIS — K219 Gastro-esophageal reflux disease without esophagitis: Secondary | ICD-10-CM

## 2016-12-31 DIAGNOSIS — Z23 Encounter for immunization: Secondary | ICD-10-CM

## 2016-12-31 DIAGNOSIS — Z780 Asymptomatic menopausal state: Secondary | ICD-10-CM

## 2016-12-31 DIAGNOSIS — M791 Myalgia, unspecified site: Secondary | ICD-10-CM

## 2016-12-31 NOTE — Telephone Encounter (Signed)
Last office visit 08/16/2016 Last gabapentin refill on 08/16/2016  #[id number] with 1 refill

## 2017-02-10 ENCOUNTER — Other Ambulatory Visit (INDEPENDENT_AMBULATORY_CARE_PROVIDER_SITE_OTHER): Payer: Managed Care, Other (non HMO)

## 2017-02-10 DIAGNOSIS — E782 Mixed hyperlipidemia: Secondary | ICD-10-CM | POA: Diagnosis not present

## 2017-02-10 DIAGNOSIS — I10 Essential (primary) hypertension: Secondary | ICD-10-CM | POA: Diagnosis not present

## 2017-02-10 DIAGNOSIS — E1065 Type 1 diabetes mellitus with hyperglycemia: Secondary | ICD-10-CM | POA: Diagnosis not present

## 2017-02-10 LAB — CBC
HCT: 42.1 % (ref 36.0–46.0)
Hemoglobin: 14.3 g/dL (ref 12.0–15.0)
MCHC: 33.9 g/dL (ref 30.0–36.0)
MCV: 89.5 fl (ref 78.0–100.0)
Platelets: 185 K/uL (ref 150.0–400.0)
RBC: 4.71 Mil/uL (ref 3.87–5.11)
RDW: 13.1 % (ref 11.5–15.5)
WBC: 4.3 K/uL (ref 4.0–10.5)

## 2017-02-10 LAB — COMPREHENSIVE METABOLIC PANEL WITH GFR
ALT: 11 U/L (ref 0–35)
AST: 12 U/L (ref 0–37)
Albumin: 3.8 g/dL (ref 3.5–5.2)
Alkaline Phosphatase: 82 U/L (ref 39–117)
BUN: 17 mg/dL (ref 6–23)
CO2: 33 meq/L — ABNORMAL HIGH (ref 19–32)
Calcium: 9.4 mg/dL (ref 8.4–10.5)
Chloride: 103 meq/L (ref 96–112)
Creatinine, Ser: 0.95 mg/dL (ref 0.40–1.20)
GFR: 63.53 mL/min
Glucose, Bld: 106 mg/dL — ABNORMAL HIGH (ref 70–99)
Potassium: 3.9 meq/L (ref 3.5–5.1)
Sodium: 141 meq/L (ref 135–145)
Total Bilirubin: 0.5 mg/dL (ref 0.2–1.2)
Total Protein: 6.9 g/dL (ref 6.0–8.3)

## 2017-02-10 LAB — HEMOGLOBIN A1C: Hgb A1c MFr Bld: 5.1 % (ref 4.6–6.5)

## 2017-02-10 LAB — LIPID PANEL
CHOL/HDL RATIO: 4
Cholesterol: 167 mg/dL (ref 0–200)
HDL: 43.6 mg/dL (ref >39.00)
LDL CALC: 92 mg/dL (ref 0–99)
NonHDL: 123.06
Triglycerides: 153 mg/dL — ABNORMAL HIGH (ref 0.0–149.0)
VLDL: 30.6 mg/dL (ref 0.0–40.0)

## 2017-02-10 LAB — TSH: TSH: 2.49 u[IU]/mL (ref 0.35–4.50)

## 2017-02-17 ENCOUNTER — Other Ambulatory Visit: Payer: Managed Care, Other (non HMO) | Admitting: Family Medicine

## 2017-02-17 ENCOUNTER — Telehealth: Payer: Self-pay | Admitting: Gastroenterology

## 2017-02-17 ENCOUNTER — Encounter: Payer: Self-pay | Admitting: Family Medicine

## 2017-02-17 ENCOUNTER — Ambulatory Visit (INDEPENDENT_AMBULATORY_CARE_PROVIDER_SITE_OTHER): Payer: Managed Care, Other (non HMO) | Admitting: Family Medicine

## 2017-02-17 ENCOUNTER — Encounter: Payer: Self-pay | Admitting: Gastroenterology

## 2017-02-17 VITALS — BP 124/74 | HR 64 | Temp 97.5°F | Resp 14 | Ht 64.0 in | Wt 213.2 lb

## 2017-02-17 DIAGNOSIS — M79672 Pain in left foot: Secondary | ICD-10-CM | POA: Diagnosis not present

## 2017-02-17 DIAGNOSIS — L578 Other skin changes due to chronic exposure to nonionizing radiation: Secondary | ICD-10-CM | POA: Insufficient documentation

## 2017-02-17 DIAGNOSIS — Z780 Asymptomatic menopausal state: Secondary | ICD-10-CM | POA: Diagnosis not present

## 2017-02-17 DIAGNOSIS — I10 Essential (primary) hypertension: Secondary | ICD-10-CM | POA: Diagnosis not present

## 2017-02-17 DIAGNOSIS — E6609 Other obesity due to excess calories: Secondary | ICD-10-CM | POA: Diagnosis not present

## 2017-02-17 DIAGNOSIS — D126 Benign neoplasm of colon, unspecified: Secondary | ICD-10-CM

## 2017-02-17 DIAGNOSIS — R739 Hyperglycemia, unspecified: Secondary | ICD-10-CM

## 2017-02-17 DIAGNOSIS — K219 Gastro-esophageal reflux disease without esophagitis: Secondary | ICD-10-CM | POA: Diagnosis not present

## 2017-02-17 DIAGNOSIS — F419 Anxiety disorder, unspecified: Secondary | ICD-10-CM

## 2017-02-17 DIAGNOSIS — M791 Myalgia, unspecified site: Secondary | ICD-10-CM

## 2017-02-17 DIAGNOSIS — Z23 Encounter for immunization: Secondary | ICD-10-CM

## 2017-02-17 DIAGNOSIS — F329 Major depressive disorder, single episode, unspecified: Secondary | ICD-10-CM

## 2017-02-17 DIAGNOSIS — F418 Other specified anxiety disorders: Secondary | ICD-10-CM

## 2017-02-17 DIAGNOSIS — E782 Mixed hyperlipidemia: Secondary | ICD-10-CM | POA: Diagnosis not present

## 2017-02-17 DIAGNOSIS — F32A Depression, unspecified: Secondary | ICD-10-CM

## 2017-02-17 HISTORY — DX: Pain in left foot: M79.672

## 2017-02-17 MED ORDER — OLMESARTAN MEDOXOMIL-HCTZ 40-12.5 MG PO TABS: 1.0000 | ORAL_TABLET | Freq: Every day | ORAL | 2 refills | Status: DC

## 2017-02-17 MED ORDER — GABAPENTIN 300 MG PO CAPS: 300.0000 mg | ORAL_CAPSULE | Freq: Two times a day (BID) | ORAL | 2 refills | Status: DC

## 2017-02-17 MED ORDER — VENLAFAXINE HCL ER 75 MG PO CP24: 75.0000 mg | ORAL_CAPSULE | Freq: Every day | ORAL | 2 refills | Status: DC

## 2017-02-17 NOTE — Telephone Encounter (Signed)
Left message for patient notifying her of this and for patient to call back and schedule recall colonoscopy.

## 2017-02-17 NOTE — Telephone Encounter (Signed)
OK 

## 2017-02-17 NOTE — Progress Notes (Signed)
[name], female   DOB: 01-12-1956, 61 y.o.   MRN: [id number]   Subjective:    [name], female    DOB: [birth year], 61 y.o.   MRN: [id number]  Chief Complaint  Patient presents with  . Follow-up    HPI Patient is in today for follow up . She feels well. No recent febrile illness or hospitalizations. She is staying active and eating a heart healthy diet most days. Is frustrated with her weight. Denies CP/palp/SOB/HA/congestion/fevers/GI or GU c/o. Taking meds as prescribed  Past Medical History:  Diagnosis Date  . Adenomatous colon polyp   . Allergic rhinitis   . Breast abscess 09/22/2014   recurrent  . Colon polyps   . Depression   . Facial lesion 06/2010   removed from right side of face--precancerous  . GERD (gastroesophageal reflux disease)   . Hypertension   . Hypoglycemia   . Hypoglycemia   . Osteoarthritis    knees neck and hands  . Pain of left heel 02/17/2017  . Preventative health care 03/07/2014  . Snoring 2008   negative sleep study of OSA  . Tendonitis 08/16/2016  . Visual disturbance    visual flashes, referred to [facility] (Normal funduscopic exam)    Past Surgical History:  Procedure Laterality Date  . ABDOMINAL HYSTERECTOMY  2002  . CARPAL TUNNEL RELEASE  05/2010, 06/2010   bilateral--Dr Tamera Punt  . CERVICAL FUSION  06/15/09   right sided C6-C7 radiculopathy status post anterior cervical diskectomy and fusion at the C5-6 and C6-7 levels. Phylliss Bob, MD  . CHOLECYSTECTOMY N/A 10/10/2016   Procedure: LAPAROSCOPIC CHOLECYSTECTOMY;  Surgeon: Autumn Messing III, MD;  Location: [facility];  Service: General;  Laterality: N/A;  . COLONOSCOPY    . DORSAL COMPARTMENT RELEASE Left 02/06/2015   Procedure: DEQUERVAIN'S RELEASE;  Surgeon: Milly Jakob, MD;  Location: [place];  Service: Orthopedics;  Laterality: Left;  . FRACTURE SURGERY  08/12/2014 & 02/06/2015   Original break in Feb & add'l surg in Aug   Plate in Left arm  . HARDWARE REMOVAL Left 02/06/2015   Procedure: LEFT WRIST REMOVAL OF HARDWARE;  Surgeon: Milly Jakob, MD;  Location: [place];  Service: Orthopedics;  Laterality: Left;  . Left wrist surgery     Feb.19th 2016  . NASAL SEPTUM SURGERY    . SPINE SURGERY  5 +/- yrs ago   Neck vertebrae fusion  . Surgery on left ankle for fracture      Family History  Problem Relation Age of Onset  . Stroke Mother   . Colon cancer Mother   . Hypertension Mother   . Hyperlipidemia Mother   . Arthritis Mother        OA and rheumatoid  . Cancer Mother        colon  . Heart disease Mother        MI 64  . Alcohol abuse Father   . Heart disease Father   . Hypertension Father   . Arthritis Maternal Grandmother        RA and OA  . Diabetes Maternal Grandmother   . Heart disease Maternal Grandmother   . Hypertension Maternal Grandmother   . Stroke Maternal Grandmother   . Heart disease Maternal Grandfather   . Hypertension Maternal Grandfather   . Diabetes Paternal Grandfather     Social History   Social History  . Marital status: Married    Spouse name: N/A  .  Number of children: N/A  . Years of education: N/A   Occupational History  . Primary school teacher for [employer] History Main Topics  . Smoking status: Never Smoker  . Smokeless tobacco: Never Used  . Alcohol use No  . Drug use: No  . Sexual activity: Yes     Comment: lives with husband, no dietary restrictions   Other Topics Concern  . Not on file   Social History Narrative  . No narrative on file    Outpatient Medications Prior to Visit  Medication Sig Dispense Refill  . aspirin 81 MG EC tablet Take 81 mg by mouth daily.      Marland Kitchen estradiol (CLIMARA - DOSED IN MG/24 HR) 0.025 mg/24hr patch Place 1 patch (0.025 mg total) onto the skin once a week. (Patient taking differently: Place 0.025 mg onto the skin every Monday. ) 12 patch 2  . fexofenadine (ALLEGRA) 180 MG  tablet Take 180 mg by mouth at bedtime as needed for allergies.     Javier Docker Oil 350 MG CAPS Take 350 mg by mouth daily.    . Misc Natural Products (GLUCOSAMINE CHONDROITIN TRIPLE) TABS Take 1 tablet by mouth 2 (two) times daily.    Marland Kitchen omeprazole (PRILOSEC) 20 MG capsule Take 2 capsules (40 mg total) by mouth daily. (Patient taking differently: Take 40 mg by mouth at bedtime. ) 180 capsule 1  . Probiotic Product (DIGESTIVE ADVANTAGE) CAPS Take 1 capsule by mouth daily.    . Soy Isoflavones 40 MG TABS Take 40 mg by mouth 2 (two) times daily.     Marland Kitchen gabapentin (NEURONTIN) 300 MG capsule TAKE 1 CAPSULE BY MOUTH TWICE DAILY 180 capsule 0  . olmesartan-hydrochlorothiazide (BENICAR HCT) 40-12.5 MG tablet TAKE 1 TABLET BY MOUTH DAILY 90 tablet 0  . venlafaxine XR (EFFEXOR-XR) 75 MG 24 hr capsule TAKE 1 CAPSULE BY MOUTH DAILY WITH BREAKFAST 90 capsule 0  . HYDROcodone-acetaminophen (NORCO/VICODIN) 5-325 MG tablet Take 1-2 tablets by mouth every 4 (four) hours as needed for moderate pain or severe pain. 15 tablet 0  . ibuprofen (ADVIL,MOTRIN) 200 MG tablet Take 600-800 mg by mouth daily as needed (for pain.).     No facility-administered medications prior to visit.     No Known Allergies  Review of Systems  Constitutional: Negative for fever and malaise/fatigue.  HENT: Negative for congestion.   Eyes: Negative for blurred vision.  Respiratory: Negative for shortness of breath.   Cardiovascular: Negative for chest pain, palpitations and leg swelling.  Gastrointestinal: Negative for abdominal pain, blood in stool and nausea.  Genitourinary: Negative for dysuria and frequency.  Musculoskeletal: Positive for joint pain. Negative for falls.  Skin: Negative for rash.  Neurological: Negative for dizziness, loss of consciousness and headaches.  Endo/Heme/Allergies: Negative for environmental allergies.  Psychiatric/Behavioral: Negative for depression. The patient is not nervous/anxious.          Objective:    Physical Exam  Constitutional: She is oriented to person, place, and time. She appears well-developed and well-nourished. No distress.  HENT:  Head: Normocephalic and atraumatic.  Nose: Nose normal.  Eyes: Right eye exhibits no discharge. Left eye exhibits no discharge.  Neck: Normal range of motion. Neck supple.  Cardiovascular: Normal rate and regular rhythm.   No murmur heard. Pulmonary/Chest: Effort normal and breath sounds normal.  Abdominal: Soft. Bowel sounds are normal. There is no tenderness.  Musculoskeletal: She exhibits no edema.  Neurological: She is alert and oriented to person,  place, and time.  Skin: Skin is warm and dry.  Small, flat brown patch 3 mm oval on left anterior shin. Scar 1 cm raised shiny lesion she has pealed off frequently. SQ 1 cm lesion under skin left forearm nontender, mobile  Psychiatric: She has a normal mood and affect.  Nursing note and vitals reviewed.   BP 124/74 (BP Location: Left Arm, Patient Position: Sitting, Cuff Size: Normal)   Pulse 64   Temp (!) 97.5 F (36.4 C) (Oral)   Resp 14   Ht 5\' 4"  (1.626 m)   Wt 213 lb 4 oz (96.7 kg)   SpO2 94%   BMI 36.60 kg/m  Wt Readings from Last 3 Encounters:  02/17/17 213 lb 4 oz (96.7 kg)  10/10/16 209 lb (94.8 kg)  10/09/16 209 lb (94.8 kg)     Lab Results  Component Value Date   WBC 4.3 02/10/2017   HGB 14.3 02/10/2017   HCT 42.1 02/10/2017   PLT 185.0 02/10/2017   GLUCOSE 106 (H) 02/10/2017   CHOL 167 02/10/2017   TRIG 153.0 (H) 02/10/2017   HDL 43.60 02/10/2017   LDLDIRECT 158.1 09/07/2010   LDLCALC 92 02/10/2017   ALT 11 02/10/2017   AST 12 02/10/2017   NA 141 02/10/2017   K 3.9 02/10/2017   CL 103 02/10/2017   CREATININE 0.95 02/10/2017   BUN 17 02/10/2017   CO2 33 (H) 02/10/2017   TSH 2.49 02/10/2017   INR 0.97 06/13/2009   HGBA1C 5.1 02/10/2017    Lab Results  Component Value Date   TSH 2.49 02/10/2017   Lab Results  Component Value Date   WBC  4.3 02/10/2017   HGB 14.3 02/10/2017   HCT 42.1 02/10/2017   MCV 89.5 02/10/2017   PLT 185.0 02/10/2017   Lab Results  Component Value Date   NA 141 02/10/2017   K 3.9 02/10/2017   CO2 33 (H) 02/10/2017   GLUCOSE 106 (H) 02/10/2017   BUN 17 02/10/2017   CREATININE 0.95 02/10/2017   BILITOT 0.5 02/10/2017   ALKPHOS 82 02/10/2017   AST 12 02/10/2017   ALT 11 02/10/2017   PROT 6.9 02/10/2017   ALBUMIN 3.8 02/10/2017   CALCIUM 9.4 02/10/2017   ANIONGAP 9 10/09/2016   GFR 63.53 02/10/2017   Lab Results  Component Value Date   CHOL 167 02/10/2017   Lab Results  Component Value Date   HDL 43.60 02/10/2017   Lab Results  Component Value Date   LDLCALC 92 02/10/2017   Lab Results  Component Value Date   TRIG 153.0 (H) 02/10/2017   Lab Results  Component Value Date   CHOLHDL 4 02/10/2017   Lab Results  Component Value Date   HGBA1C 5.1 02/10/2017       Assessment & Plan:   Problem List Items Addressed This Visit    COLONIC POLYPS    She refused       Relevant Orders   Ambulatory referral to Gastroenterology   Essential hypertension    Well controlled, no changes to meds. Encouraged heart healthy diet such as the DASH diet and exercise as tolerated.       Relevant Medications   venlafaxine XR (EFFEXOR-XR) 75 MG 24 hr capsule   olmesartan-hydrochlorothiazide (BENICAR HCT) 40-12.5 MG tablet   gabapentin (NEURONTIN) 300 MG capsule   Other Relevant Orders   CBC   Comprehensive metabolic panel   TSH   GERD (gastroesophageal reflux disease)   Relevant Medications   venlafaxine  XR (EFFEXOR-XR) 75 MG 24 hr capsule   Obesity    Encouraged DASH diet, decrease po intake and increase exercise as tolerated. Needs 7-8 hours of sleep nightly. Avoid trans fats, eat small, frequent meals every 4-5 hours with lean proteins, complex carbs and healthy fats. Minimize simple carbs, bariatric referral placed      Post-menopause   Relevant Medications   venlafaxine XR  (EFFEXOR-XR) 75 MG 24 hr capsule   olmesartan-hydrochlorothiazide (BENICAR HCT) 40-12.5 MG tablet   gabapentin (NEURONTIN) 300 MG capsule   Sun-damaged skin - Primary    Referred to dermatology for evaluation      Relevant Orders   Ambulatory referral to Dermatology   Pain of left heel    Managed by Dr Novella Olive, therapy ,marginally helpful but got good relief from steroid injections       Other Visit Diagnoses    Mixed hyperlipidemia       Relevant Medications   olmesartan-hydrochlorothiazide (BENICAR HCT) 40-12.5 MG tablet   Other Relevant Orders   Lipid panel   Hyperglycemia       Relevant Orders   Hemoglobin A1c   Need for influenza vaccination       Relevant Orders   Flu Vaccine QUAD 6+ mos PF IM (Fluarix Quad PF) (Completed)   Need for prophylactic vaccination and inoculation against influenza       Relevant Medications   venlafaxine XR (EFFEXOR-XR) 75 MG 24 hr capsule   olmesartan-hydrochlorothiazide (BENICAR HCT) 40-12.5 MG tablet   gabapentin (NEURONTIN) 300 MG capsule   Depression with anxiety       Relevant Medications   venlafaxine XR (EFFEXOR-XR) 75 MG 24 hr capsule   Myalgia       Relevant Medications   olmesartan-hydrochlorothiazide (BENICAR HCT) 40-12.5 MG tablet   gabapentin (NEURONTIN) 300 MG capsule      I have discontinued Julie Gray's ibuprofen and HYDROcodone-acetaminophen. I have also changed her venlafaxine XR and gabapentin. Additionally, I am having her maintain her fexofenadine, Soy Isoflavones, aspirin, estradiol, omeprazole, Krill Oil, Glucosamine Chondroitin Triple, DIGESTIVE ADVANTAGE, and olmesartan-hydrochlorothiazide.  Meds ordered this encounter  Medications  . DISCONTD: estradiol (VIVELLE-DOT) 0.025 MG/24HR  . DISCONTD: omeprazole (PRILOSEC OTC) 20 MG tablet  . venlafaxine XR (EFFEXOR-XR) 75 MG 24 hr capsule    Sig: Take 1 capsule (75 mg total) by mouth daily with breakfast.    Dispense:  90 capsule    Refill:  2  .  olmesartan-hydrochlorothiazide (BENICAR HCT) 40-12.5 MG tablet    Sig: Take 1 tablet by mouth daily.    Dispense:  90 tablet    Refill:  2  . gabapentin (NEURONTIN) 300 MG capsule    Sig: Take 1 capsule (300 mg total) by mouth 2 (two) times daily.    Dispense:  180 capsule    Refill:  2     Penni Homans, MD

## 2017-02-17 NOTE — Assessment & Plan Note (Signed)
Well controlled, no changes to meds. Encouraged heart healthy diet such as the DASH diet and exercise as tolerated.  °

## 2017-02-17 NOTE — Progress Notes (Signed)
Pre visit review using our clinic review tool, if applicable. No additional management support is needed unless otherwise documented below in the visit note. 

## 2017-02-17 NOTE — Telephone Encounter (Signed)
Dr. Fuller Plan do you accept?

## 2017-02-17 NOTE — Patient Instructions (Signed)
Seborrheic Keratosis Seborrheic keratosis is a common, noncancerous (benign) skin growth. This condition causes waxy, rough, tan, brown, or black spots to appear on the skin. These skin growths can be flat or raised. What are the causes? The cause of this condition is not known. What increases the risk? This condition is more likely to develop in:  People who have a family history of seborrheic keratosis.  People who are 50 or older.  People who are pregnant.  People who have had estrogen replacement therapy.  What are the signs or symptoms? This condition often occurs on the face, chest, shoulders, back, or other areas. These growths:  Are usually painless, but may become irritated and itchy.  Can be yellow, brown, black, or other colors.  Are slightly raised or have a flat surface.  Are sometimes rough or wart-like in texture.  Are often waxy on the surface.  Are round or oval-shaped.  Sometimes look like they are "stuck on."  Often occur in groups, but may occur as a single growth.  How is this diagnosed? This condition is diagnosed with a medical history and physical exam. A sample of the growth may be tested (skin biopsy). You may need to see a skin specialist (dermatologist). How is this treated? Treatment is not usually needed for this condition, unless the growths are irritated or are often bleeding. You may also choose to have the growths removed if you do not like their appearance. Most commonly, these growths are treated with a procedure in which liquid nitrogen is applied to "freeze" off the growth (cryosurgery). They may also be burned off with electricity or cut off. Follow these instructions at home:  Watch your growth for any changes.  Keep all follow-up visits as told by your health care provider. This is important.  Do not scratch or pick at the growth or growths. This can cause them to become irritated or infected. Contact a health care provider  if:  You suddenly have many new growths.  Your growth bleeds, itches, or hurts.  Your growth suddenly becomes larger or changes color. This information is not intended to replace advice given to you by your health care provider. Make sure you discuss any questions you have with your health care provider. Document Released: 07/13/2010 Document Revised: 11/16/2015 Document Reviewed: 10/26/2014 Elsevier Interactive Patient Education  2017 Elsevier Inc.  

## 2017-02-17 NOTE — Assessment & Plan Note (Addendum)
Referred to gastroenterology. 

## 2017-02-17 NOTE — Assessment & Plan Note (Addendum)
Referred to dermatology for evaluation. 

## 2017-02-17 NOTE — Assessment & Plan Note (Signed)
Managed by Dr Novella Olive, therapy ,marginally helpful but got good relief from steroid injections

## 2017-02-17 NOTE — Assessment & Plan Note (Signed)
Encouraged DASH diet, decrease po intake and increase exercise as tolerated. Needs 7-8 hours of sleep nightly. Avoid trans fats, eat small, frequent meals every 4-5 hours with lean proteins, complex carbs and healthy fats. Minimize simple carbs, bariatric referral placed

## 2017-02-24 NOTE — Assessment & Plan Note (Signed)
Doing well on Venlafaxine continue the same. refills given.

## 2017-02-24 NOTE — Assessment & Plan Note (Signed)
Encouraged heart healthy diet, increase exercise, avoid trans fats, consider a krill oil cap daily 

## 2017-03-24 ENCOUNTER — Ambulatory Visit (AMBULATORY_SURGERY_CENTER): Payer: Self-pay | Admitting: *Deleted

## 2017-03-24 ENCOUNTER — Telehealth: Payer: Self-pay | Admitting: *Deleted

## 2017-03-24 VITALS — Ht 63.5 in | Wt 210.0 lb

## 2017-03-24 DIAGNOSIS — Z800 Family history of malignant neoplasm of digestive organs: Secondary | ICD-10-CM

## 2017-03-24 DIAGNOSIS — Z86010 Personal history of colonic polyps: Secondary | ICD-10-CM

## 2017-03-24 MED ORDER — NA SULFATE-K SULFATE-MG SULF 17.5-3.13-1.6 GM/177ML PO SOLN: 1.0000 | Freq: Once | ORAL | 0 refills | Status: AC

## 2017-03-24 MED ORDER — METOCLOPRAMIDE HCL 10 MG PO TABS: 10.0000 mg | ORAL_TABLET | ORAL | 0 refills | Status: DC

## 2017-03-24 NOTE — Telephone Encounter (Signed)
Sent in Rx per Dr stark, called pt and notified her Reglan sent in to pharmacy  and directions to use.   Julie Gray [facility]

## 2017-03-24 NOTE — Progress Notes (Signed)
No egg or soy allergy known to patient  No issues with past sedation with any surgeries  or procedures, no intubation problems  No diet pills per patient No home 02 use per patient  No blood thinners per patient  Pt denies issues with constipation  No A fib or A flutter  EMMI video sent to pt's e mail - pt declined  Pt states with her last colon she vomited most of her prep. Will send TE to Dr Juanda Bond to ask about Reglan - pt aware will call her Tuesday

## 2017-03-24 NOTE — Telephone Encounter (Signed)
Dr Fuller Plan,  I saw this pt in [facility] today.  Pt states at her last colon in 2006 she vomited most of her prep. She had a good prep despite this per her report.  She is scheduled for a colon 04-07-17.   Can she have Reglan to take before each prep dose ?   she was very concerned about keeping her prep down ( call pt at [phone])   Thank you for your time, Marijean Niemann

## 2017-03-24 NOTE — Telephone Encounter (Signed)
Yes, Reglan 10 mg x 2 taken 30-60 minutes before each dose of prep

## 2017-04-02 LAB — HM MAMMOGRAPHY

## 2017-04-07 ENCOUNTER — Ambulatory Visit (AMBULATORY_SURGERY_CENTER): Payer: Managed Care, Other (non HMO) | Admitting: Gastroenterology

## 2017-04-07 ENCOUNTER — Encounter: Payer: Self-pay | Admitting: Gastroenterology

## 2017-04-07 VITALS — BP 113/70 | HR 65 | Temp 98.4°F | Resp 25 | Ht 63.5 in | Wt 210.0 lb

## 2017-04-07 DIAGNOSIS — D124 Benign neoplasm of descending colon: Secondary | ICD-10-CM

## 2017-04-07 DIAGNOSIS — Z86010 Personal history of colonic polyps: Secondary | ICD-10-CM

## 2017-04-07 DIAGNOSIS — D125 Benign neoplasm of sigmoid colon: Secondary | ICD-10-CM | POA: Diagnosis not present

## 2017-04-07 DIAGNOSIS — K635 Polyp of colon: Secondary | ICD-10-CM

## 2017-04-07 DIAGNOSIS — Z800 Family history of malignant neoplasm of digestive organs: Secondary | ICD-10-CM | POA: Diagnosis not present

## 2017-04-07 DIAGNOSIS — D123 Benign neoplasm of transverse colon: Secondary | ICD-10-CM

## 2017-04-07 MED ORDER — SODIUM CHLORIDE 0.9 % IV SOLN: 500.0000 mL | INTRAVENOUS | Status: DC

## 2017-04-07 NOTE — Progress Notes (Signed)
Called to room to assist during endoscopic procedure.  Patient ID and intended procedure confirmed with present staff. Received instructions for my participation in the procedure from the performing physician.  

## 2017-04-07 NOTE — Op Note (Addendum)
[facility] Patient Name: Julie Gray Procedure Date: 04/07/2017 3:11 PM MRN: [id number] Endoscopist: Ladene Artist , MD Age: 61 Referring MD:  Date of Birth: June 18, 1956 Gender: Female Account #: [id number] Procedure:                Colonoscopy Indications:              Surveillance: Personal history of adenomatous                            polyps on last colonoscopy 5 years ago. Family                            history of colon cancer. Medicines:                Monitored Anesthesia Care Procedure:                Pre-Anesthesia Assessment:                           - Prior to the procedure, a History and Physical                            was performed, and patient medications and                            allergies were reviewed. The patient's tolerance of                            previous anesthesia was also reviewed. The risks                            and benefits of the procedure and the sedation                            options and risks were discussed with the patient.                            All questions were answered, and informed consent                            was obtained. Prior Anticoagulants: The patient has                            taken no previous anticoagulant or antiplatelet                            agents. ASA Grade Assessment: II - A patient with                            mild systemic disease. After reviewing the risks                            and benefits, the patient was deemed in  satisfactory condition to undergo the procedure.                           After obtaining informed consent, the colonoscope                            was passed under direct vision. Throughout the                            procedure, the patient's blood pressure, pulse, and                            oxygen saturations were monitored continuously. The                            Model PCF-H190DL [id number]) scope  was introduced                            through the anus and advanced to the the cecum,                            identified by appendiceal orifice and ileocecal                            valve. The ileocecal valve, appendiceal orifice,                            and rectum were photographed. The quality of the                            bowel preparation was good. The colonoscopy was                            performed without difficulty. The patient tolerated                            the procedure well. Scope In: 3:25:24 PM Scope Out: 3:40:33 PM Scope Withdrawal Time: 0 hours 13 minutes 15 seconds  Total Procedure Duration: 0 hours 15 minutes 9 seconds  Findings:                 The perianal and digital rectal examinations were                            normal.                           A 4 mm polyp was found in the transverse colon. The                            polyp was sessile. The polyp was removed with a                            cold biopsy forceps. Resection and retrieval were  complete.                           Four sessile polyps were found in the sigmoid colon                            (1), descending colon (1) and transverse colon (2).                            The polyps were 6 to 9 mm in size. These polyps                            were removed with a cold snare. Resection and                            retrieval were complete.                           The exam was otherwise without abnormality on                            direct and retroflexion views. Complications:            No immediate complications. Estimated blood loss:                            None. Estimated Blood Loss:     Estimated blood loss: none. Impression:               - One 4 mm polyp in the transverse colon, removed                            with a cold biopsy forceps. Resected and retrieved.                           - Four 6 to 9 mm polyps in the sigmoid  colon, in                            the descending colon and in the transverse colon,                            removed with a cold snare. Resected and retrieved.                           - The examination was otherwise normal on direct                            and retroflexion views. Recommendation:           - Repeat colonoscopy in 3 - 5 years for                            surveillance pending path review.                           -  Patient has a contact number available for                            emergencies. The signs and symptoms of potential                            delayed complications were discussed with the                            patient. Return to normal activities tomorrow.                            Written discharge instructions were provided to the                            patient.                           - Resume previous diet.                           - Continue present medications.                           - Await pathology results. Ladene Artist, MD 04/07/2017 3:43:27 PM This report has been signed electronically.

## 2017-04-07 NOTE — Progress Notes (Signed)
Pt's states no medical or surgical changes since previsit or office visit. 

## 2017-04-07 NOTE — Progress Notes (Signed)
Report given to PACU, vss 

## 2017-04-07 NOTE — Patient Instructions (Signed)
Discharge instructions given. Handout on polyps. Resume previous medications. YOU HAD AN ENDOSCOPIC PROCEDURE TODAY AT THE  ENDOSCOPY CENTER:   Refer to the procedure report that was given to you for any specific questions about what was found during the examination.  If the procedure report does not answer your questions, please call your gastroenterologist to clarify.  If you requested that your care partner not be given the details of your procedure findings, then the procedure report has been included in a sealed envelope for you to review at your convenience later.  YOU SHOULD EXPECT: Some feelings of bloating in the abdomen. Passage of more gas than usual.  Walking can help get rid of the air that was put into your GI tract during the procedure and reduce the bloating. If you had a lower endoscopy (such as a colonoscopy or flexible sigmoidoscopy) you may notice spotting of blood in your stool or on the toilet paper. If you underwent a bowel prep for your procedure, you may not have a normal bowel movement for a few days.  Please Note:  You might notice some irritation and congestion in your nose or some drainage.  This is from the oxygen used during your procedure.  There is no need for concern and it should clear up in a day or so.  SYMPTOMS TO REPORT IMMEDIATELY:   Following lower endoscopy (colonoscopy or flexible sigmoidoscopy):  Excessive amounts of blood in the stool  Significant tenderness or worsening of abdominal pains  Swelling of the abdomen that is new, acute  Fever of 100F or higher   For urgent or emergent issues, a gastroenterologist can be reached at any hour by calling [phone].   DIET:  We do recommend a small meal at first, but then you may proceed to your regular diet.  Drink plenty of fluids but you should avoid alcoholic beverages for 24 hours.  ACTIVITY:  You should plan to take it easy for the rest of today and you should NOT DRIVE or use heavy  machinery until tomorrow (because of the sedation medicines used during the test).    FOLLOW UP: Our staff will call the number listed on your records the next business day following your procedure to check on you and address any questions or concerns that you may have regarding the information given to you following your procedure. If we do not reach you, we will leave a message.  However, if you are feeling well and you are not experiencing any problems, there is no need to return our call.  We will assume that you have returned to your regular daily activities without incident.  If any biopsies were taken you will be contacted by phone or by letter within the next 1-3 weeks.  Please call us at [phone] if you have not heard about the biopsies in 3 weeks.    SIGNATURES/CONFIDENTIALITY: You and/or your care partner have signed paperwork which will be entered into your electronic medical record.  These signatures attest to the fact that that the information above on your After Visit Summary has been reviewed and is understood.  Full responsibility of the confidentiality of this discharge information lies with you and/or your care-partner. 

## 2017-04-08 ENCOUNTER — Telehealth: Payer: Self-pay | Admitting: *Deleted

## 2017-04-08 NOTE — Telephone Encounter (Signed)
  Follow up Call-  Call back number 04/07/2017  Post procedure Call Back phone  # [phone]  Permission to leave phone message Yes  Some recent data might be hidden     Patient questions:  Do you have a fever, pain , or abdominal swelling? No. Pain Score  0 *  Have you tolerated food without any problems? Yes.    Have you been able to return to your normal activities? Yes.    Do you have any questions about your discharge instructions: Diet   No. Medications  No. Follow up visit  No.  Do you have questions or concerns about your Care? No.  Actions: * If pain score is 4 or above: No action needed, pain <4.

## 2017-04-10 ENCOUNTER — Encounter: Payer: Self-pay | Admitting: Family Medicine

## 2017-04-18 ENCOUNTER — Encounter: Payer: Self-pay | Admitting: Gastroenterology

## 2017-04-25 ENCOUNTER — Encounter: Payer: Self-pay | Admitting: Family Medicine

## 2017-08-25 ENCOUNTER — Other Ambulatory Visit (INDEPENDENT_AMBULATORY_CARE_PROVIDER_SITE_OTHER): Payer: Managed Care, Other (non HMO)

## 2017-08-25 DIAGNOSIS — E782 Mixed hyperlipidemia: Secondary | ICD-10-CM

## 2017-08-25 DIAGNOSIS — R739 Hyperglycemia, unspecified: Secondary | ICD-10-CM | POA: Diagnosis not present

## 2017-08-25 DIAGNOSIS — I10 Essential (primary) hypertension: Secondary | ICD-10-CM

## 2017-08-25 LAB — COMPREHENSIVE METABOLIC PANEL
ALBUMIN: 3.8 g/dL (ref 3.5–5.2)
ALK PHOS: 91 U/L (ref 39–117)
ALT: 12 U/L (ref 0–35)
AST: 13 U/L (ref 0–37)
BILIRUBIN TOTAL: 0.4 mg/dL (ref 0.2–1.2)
BUN: 16 mg/dL (ref 6–23)
CALCIUM: 9.9 mg/dL (ref 8.4–10.5)
CHLORIDE: 101 meq/L (ref 96–112)
CO2: 34 mEq/L — ABNORMAL HIGH (ref 19–32)
CREATININE: 1.02 mg/dL (ref 0.40–1.20)
GFR: 58.42 mL/min — ABNORMAL LOW (ref >60.00)
Glucose, Bld: 81 mg/dL (ref 70–99)
Potassium: 3.8 mEq/L (ref 3.5–5.1)
SODIUM: 141 meq/L (ref 135–145)
TOTAL PROTEIN: 7.1 g/dL (ref 6.0–8.3)

## 2017-08-25 LAB — CBC
HEMATOCRIT: 42.6 % (ref 36.0–46.0)
Hemoglobin: 14.5 g/dL (ref 12.0–15.0)
MCHC: 34.1 g/dL (ref 30.0–36.0)
MCV: 89.6 fl (ref 78.0–100.0)
Platelets: 211 10*3/uL (ref 150.0–400.0)
RBC: 4.75 Mil/uL (ref 3.87–5.11)
RDW: 12.8 % (ref 11.5–15.5)
WBC: 5 10*3/uL (ref 4.0–10.5)

## 2017-08-25 LAB — LDL CHOLESTEROL, DIRECT: LDL DIRECT: 125 mg/dL

## 2017-08-25 LAB — LIPID PANEL
CHOLESTEROL: 193 mg/dL (ref 0–200)
HDL: 44 mg/dL (ref >39.00)
NonHDL: 149.09
Total CHOL/HDL Ratio: 4
Triglycerides: 212 mg/dL — ABNORMAL HIGH (ref 0.0–149.0)
VLDL: 42.4 mg/dL — ABNORMAL HIGH (ref 0.0–40.0)

## 2017-08-25 LAB — HEMOGLOBIN A1C: HEMOGLOBIN A1C: 5.2 % (ref 4.6–6.5)

## 2017-08-25 LAB — TSH: TSH: 2.15 u[IU]/mL (ref 0.35–4.50)

## 2017-09-01 ENCOUNTER — Encounter: Payer: Self-pay | Admitting: Family Medicine

## 2017-09-01 ENCOUNTER — Ambulatory Visit (INDEPENDENT_AMBULATORY_CARE_PROVIDER_SITE_OTHER): Payer: Managed Care, Other (non HMO) | Admitting: Family Medicine

## 2017-09-01 DIAGNOSIS — E782 Mixed hyperlipidemia: Secondary | ICD-10-CM | POA: Diagnosis not present

## 2017-09-01 DIAGNOSIS — I10 Essential (primary) hypertension: Secondary | ICD-10-CM

## 2017-09-01 DIAGNOSIS — K219 Gastro-esophageal reflux disease without esophagitis: Secondary | ICD-10-CM | POA: Diagnosis not present

## 2017-09-01 DIAGNOSIS — E6609 Other obesity due to excess calories: Secondary | ICD-10-CM

## 2017-09-01 DIAGNOSIS — B351 Tinea unguium: Secondary | ICD-10-CM | POA: Diagnosis not present

## 2017-09-01 DIAGNOSIS — F329 Major depressive disorder, single episode, unspecified: Secondary | ICD-10-CM

## 2017-09-01 DIAGNOSIS — F419 Anxiety disorder, unspecified: Secondary | ICD-10-CM | POA: Diagnosis not present

## 2017-09-01 MED ORDER — PANTOPRAZOLE SODIUM 40 MG PO TBEC: 40.0000 mg | DELAYED_RELEASE_TABLET | Freq: Every day | ORAL | 1 refills | Status: DC

## 2017-09-01 NOTE — Assessment & Plan Note (Signed)
Doing well on Terbafine following with Dr Burgess Amor

## 2017-09-01 NOTE — Progress Notes (Signed)
Subjective:  I acted as a Education administrator for Dr. Charlett Blake. [place], [place]  [name], female    DOB: 15-Apr-1956, 62 y.o.   MRN: [id number]  No chief complaint on file.   Patient is in for follow up and she is doing well. No recent febrile illness or hosptializations. She is taking Pantoprazole with good results for her reflux. No bad flares. She is questioning if her probiotic makes her nauseous. Her toenail is clearing with Terbenafine. Denies CP/palp/SOB/HA/congestion/fevers/GI or GU c/o. Taking meds as prescribed  Patient Care Team: Mosie Lukes, MD as PCP - General (Family Medicine) Vania Rea, MD as Consulting Physician (Obstetrics and Gynecology)   Past Medical History:  Diagnosis Date  . Adenomatous colon polyp   . Allergic rhinitis   . Allergy    late fall, spring   . Anemia    as teenager   . Breast abscess 09/22/2014   recurrent  . Colon polyps   . Depression    pt denies- uses venlafaxine with menopause   . Facial lesion 06/2010   removed from right side of face--precancerous  . GERD (gastroesophageal reflux disease)   . Hypertension   . Hypoglycemia   . Hypoglycemia   . Osteoarthritis    knees neck and hands- OA  . Pain of left heel 02/17/2017  . Preventative health care 03/07/2014  . Snoring 2008   negative sleep study of OSA  . Tendonitis 08/16/2016  . Visual disturbance    visual flashes, referred to [facility] (Normal funduscopic exam)    Past Surgical History:  Procedure Laterality Date  . ABDOMINAL HYSTERECTOMY  2002  . CARPAL TUNNEL RELEASE  05/2010, 06/2010   bilateral--Dr Tamera Punt  . CERVICAL FUSION  06/15/09   right sided C6-C7 radiculopathy status post anterior cervical diskectomy and fusion at the C5-6 and C6-7 levels. Phylliss Bob, MD  . CHOLECYSTECTOMY N/A 10/10/2016   Procedure: LAPAROSCOPIC CHOLECYSTECTOMY;  Surgeon: Autumn Messing III, MD;  Location: [facility];  Service: General;  Laterality: N/A;  . COLONOSCOPY    .  DORSAL COMPARTMENT RELEASE Left 02/06/2015   Procedure: DEQUERVAIN'S RELEASE;  Surgeon: Milly Jakob, MD;  Location: [place];  Service: Orthopedics;  Laterality: Left;  . FRACTURE SURGERY  08/12/2014 & 02/06/2015   Original break in Feb & add'l surg in Aug  Plate in Left arm  . HARDWARE REMOVAL Left 02/06/2015   Procedure: LEFT WRIST REMOVAL OF HARDWARE;  Surgeon: Milly Jakob, MD;  Location: [place];  Service: Orthopedics;  Laterality: Left;  . Left wrist surgery     Feb.19th 2016  . NASAL SEPTUM SURGERY    . SPINE SURGERY  5 +/- yrs ago   Neck vertebrae fusion  . Surgery on left ankle for fracture      Family History  Problem Relation Age of Onset  . Stroke Mother   . Colon cancer Mother   . Hypertension Mother   . Hyperlipidemia Mother   . Arthritis Mother        OA and rheumatoid  . Cancer Mother        colon  . Heart disease Mother        MI 9  . Colon polyps Mother   . Alcohol abuse Father   . Heart disease Father   . Hypertension Father   . Colon polyps Father   . Arthritis Maternal Grandmother        RA and OA  .  Diabetes Maternal Grandmother   . Heart disease Maternal Grandmother   . Hypertension Maternal Grandmother   . Stroke Maternal Grandmother   . Heart disease Maternal Grandfather   . Hypertension Maternal Grandfather   . Diabetes Paternal Grandfather   . Esophageal cancer Neg Hx   . Rectal cancer Neg Hx   . Stomach cancer Neg Hx     Social History   Socioeconomic History  . Marital status: Married    Spouse name: Not on file  . Number of children: Not on file  . Years of education: Not on file  . Highest education level: Not on file  Social Needs  . Financial resource strain: Not on file  . Food insecurity - worry: Not on file  . Food insecurity - inability: Not on file  . Transportation needs - medical: Not on file  . Transportation needs - non-medical: Not on file  Occupational History  . Occupation:  Primary school teacher for [employer]  Tobacco Use  . Smoking status: Never Smoker  . Smokeless tobacco: Never Used  Substance and Sexual Activity  . Alcohol use: No  . Drug use: No  . Sexual activity: Yes    Comment: lives with husband, no dietary restrictions  Other Topics Concern  . Not on file  Social History Narrative  . Not on file    Outpatient Medications Prior to Visit  Medication Sig Dispense Refill  . aspirin 81 MG EC tablet Take 81 mg by mouth daily.      Marland Kitchen estradiol (CLIMARA - DOSED IN MG/24 HR) 0.025 mg/24hr patch Place 1 patch (0.025 mg total) onto the skin once a week. (Patient taking differently: Place 0.025 mg onto the skin every Monday. ) 12 patch 2  . fexofenadine (ALLEGRA) 180 MG tablet Take 180 mg by mouth at bedtime as needed for allergies.     Marland Kitchen gabapentin (NEURONTIN) 300 MG capsule Take 1 capsule (300 mg total) by mouth 2 (two) times daily. 180 capsule 2  . Krill Oil 350 MG CAPS Take 350 mg by mouth daily.    . Misc Natural Products (GLUCOSAMINE CHONDROITIN TRIPLE) TABS Take 1 tablet by mouth 2 (two) times daily.    Marland Kitchen olmesartan-hydrochlorothiazide (BENICAR HCT) 40-12.5 MG tablet Take 1 tablet by mouth daily. 90 tablet 2  . Probiotic Product (DIGESTIVE ADVANTAGE) CAPS Take 1 capsule by mouth daily.    . Soy Isoflavones 40 MG TABS Take 40 mg by mouth 2 (two) times daily.     Marland Kitchen venlafaxine XR (EFFEXOR-XR) 75 MG 24 hr capsule Take 1 capsule (75 mg total) by mouth daily with breakfast. 90 capsule 2  . econazole nitrate 1 % cream Apply 1 application topically daily.  1  . metoCLOPramide (REGLAN) 10 MG tablet Take 1 tablet (10 mg total) by mouth as directed. Take 1 tablet by mouth 30-60 minutes before each dose of prep 2 tablet 0  . omeprazole (PRILOSEC) 20 MG capsule Take 2 capsules (40 mg total) by mouth daily. (Patient taking differently: Take 40 mg by mouth at bedtime. ) 180 capsule 1   No facility-administered medications prior to visit.     No  Known Allergies  Review of Systems  Constitutional: Negative for fever and malaise/fatigue.  HENT: Negative for congestion.   Eyes: Negative for blurred vision.  Respiratory: Negative for shortness of breath.   Cardiovascular: Negative for chest pain, palpitations and leg swelling.  Gastrointestinal: Positive for heartburn. Negative for abdominal pain, blood in stool and nausea.  Genitourinary: Negative for dysuria and frequency.  Musculoskeletal: Negative for falls.  Skin: Negative for rash.  Neurological: Negative for dizziness, loss of consciousness and headaches.  Endo/Heme/Allergies: Negative for environmental allergies.  Psychiatric/Behavioral: Negative for depression. The patient is not nervous/anxious.        Objective:    Physical Exam  Constitutional: She is oriented to person, place, and time. She appears well-developed and well-nourished. No distress.  HENT:  Head: Normocephalic and atraumatic.  Nose: Nose normal.  Eyes: Right eye exhibits no discharge. Left eye exhibits no discharge.  Neck: Normal range of motion. Neck supple.  Cardiovascular: Normal rate and regular rhythm.  No murmur heard. Pulmonary/Chest: Effort normal and breath sounds normal.  Abdominal: Soft. Bowel sounds are normal. There is no tenderness.  Musculoskeletal: She exhibits no edema.  Neurological: She is alert and oriented to person, place, and time.  Skin: Skin is warm and dry.  Right great toenail thick but the base is clearing  Psychiatric: She has a normal mood and affect.  Nursing note reviewed.   BP 100/60 (BP Location: Left Arm, Patient Position: Sitting, Cuff Size: Normal)   Pulse 70   Temp (!) 97.4 F (36.3 C) (Oral)   Resp 18   Wt 210 lb 12.8 oz (95.6 kg)   SpO2 96%   BMI 36.76 kg/m  Wt Readings from Last 3 Encounters:  09/01/17 210 lb 12.8 oz (95.6 kg)  04/07/17 210 lb (95.3 kg)  03/24/17 210 lb (95.3 kg)   BP Readings from Last 3 Encounters:  09/01/17 100/60    04/07/17 113/70  02/17/17 124/74     Immunization History  Administered Date(s) Administered  . Influenza Split 05/14/2011, 03/26/2012  . Influenza Whole 05/02/2010  . Influenza,inj,Quad PF,6+ Mos 03/07/2014, 08/18/2015, 02/17/2017  . Influenza-Unspecified 03/24/2012, 01/22/2017  . Td 07/27/2007    Health Maintenance  Topic Date Due  . Hepatitis C Screening  May 05, 1956  . PNEUMOCOCCAL POLYSACCHARIDE VACCINE (1) 12/20/1957  . FOOT EXAM  12/20/1965  . OPHTHALMOLOGY EXAM  12/20/1965  . HIV Screening  12/21/1970  . TETANUS/TDAP  07/26/2017  . HEMOGLOBIN A1C  02/25/2018  . MAMMOGRAM  04/03/2019  . COLONOSCOPY  04/07/2020  . INFLUENZA VACCINE  Completed    Lab Results  Component Value Date   WBC 5.0 08/25/2017   HGB 14.5 08/25/2017   HCT 42.6 08/25/2017   PLT 211.0 08/25/2017   GLUCOSE 81 08/25/2017   CHOL 193 08/25/2017   TRIG 212.0 (H) 08/25/2017   HDL 44.00 08/25/2017   LDLDIRECT 125.0 08/25/2017   LDLCALC 92 02/10/2017   ALT 12 08/25/2017   AST 13 08/25/2017   NA 141 08/25/2017   K 3.8 08/25/2017   CL 101 08/25/2017   CREATININE 1.02 08/25/2017   BUN 16 08/25/2017   CO2 34 (H) 08/25/2017   TSH 2.15 08/25/2017   INR 0.97 06/13/2009   HGBA1C 5.2 08/25/2017    Lab Results  Component Value Date   TSH 2.15 08/25/2017   Lab Results  Component Value Date   WBC 5.0 08/25/2017   HGB 14.5 08/25/2017   HCT 42.6 08/25/2017   MCV 89.6 08/25/2017   PLT 211.0 08/25/2017   Lab Results  Component Value Date   NA 141 08/25/2017   K 3.8 08/25/2017   CO2 34 (H) 08/25/2017   GLUCOSE 81 08/25/2017   BUN 16 08/25/2017   CREATININE 1.02 08/25/2017   BILITOT 0.4 08/25/2017   ALKPHOS 91 08/25/2017   AST 13 08/25/2017  ALT 12 08/25/2017   PROT 7.1 08/25/2017   ALBUMIN 3.8 08/25/2017   CALCIUM 9.9 08/25/2017   ANIONGAP 9 10/09/2016   GFR 58.42 (L) 08/25/2017   Lab Results  Component Value Date   CHOL 193 08/25/2017   Lab Results  Component Value Date   HDL  44.00 08/25/2017   Lab Results  Component Value Date   LDLCALC 92 02/10/2017   Lab Results  Component Value Date   TRIG 212.0 (H) 08/25/2017   Lab Results  Component Value Date   CHOLHDL 4 08/25/2017   Lab Results  Component Value Date   HGBA1C 5.2 08/25/2017         Assessment & Plan:   Problem List Items Addressed This Visit    Essential hypertension    Well controlled, no changes to meds. Encouraged heart healthy diet such as the DASH diet and exercise as tolerated.       Relevant Orders   CBC   Comprehensive metabolic panel   TSH   GERD (gastroesophageal reflux disease)    Started on Pantoprazole she tried her husband's and it worked well. Prescription is given      Relevant Medications   pantoprazole (PROTONIX) 40 MG tablet   Hyperlipidemia, mixed    Encouraged heart healthy diet, increase exercise, avoid trans fats, consider a krill oil cap daily      Relevant Orders   Lipid panel   Obesity    Encouraged DASH diet, decrease po intake and increase exercise as tolerated. Needs 7-8 hours of sleep nightly. Avoid trans fats, eat small, frequent meals every 4-5 hours with lean proteins, complex carbs and healthy fats. Minimize simple carbs, bariatric referral offered. Or consider DASH or MIND diet      Anxiety and depression    Doing well on current meds, no changes      Onychomycosis    Doing well on Terbafine following with Dr Burgess Amor         I have discontinued Faythe Casa "Cindy"'s omeprazole, metoCLOPramide, and econazole nitrate. I am also having her maintain her fexofenadine, Soy Isoflavones, aspirin, estradiol, Krill Oil, Glucosamine Chondroitin Triple, DIGESTIVE ADVANTAGE, venlafaxine XR, olmesartan-hydrochlorothiazide, gabapentin, and pantoprazole.  Meds ordered this encounter  Medications  . DISCONTD: pantoprazole (PROTONIX) 40 MG tablet    Sig: Take 1 tablet (40 mg total) by mouth daily.    Dispense:  90 tablet    Refill:  1  .  pantoprazole (PROTONIX) 40 MG tablet    Sig: Take 1 tablet (40 mg total) by mouth daily.    Dispense:  90 tablet    Refill:  1    CMA served as Education administrator during this visit. History, Physical and Plan performed by medical provider. Documentation and orders reviewed and attested to.  Penni Homans, MD

## 2017-09-01 NOTE — Assessment & Plan Note (Signed)
Encouraged heart healthy diet, increase exercise, avoid trans fats, consider a krill oil cap daily 

## 2017-09-01 NOTE — Patient Instructions (Addendum)
MIND or DASH diet Shingrix  Cholesterol Cholesterol is a white, waxy, fat-like substance that is needed by the human body in small amounts. The liver makes all the cholesterol we need. Cholesterol is carried from the liver by the blood through the blood vessels. Deposits of cholesterol (plaques) may build up on blood vessel (artery) walls. Plaques make the arteries narrower and stiffer. Cholesterol plaques increase the risk for heart attack and stroke. You cannot feel your cholesterol level even if it is very high. The only way to know that it is high is to have a blood test. Once you know your cholesterol levels, you should keep a record of the test results. Work with your health care provider to keep your levels in the desired range. What do the results mean?  Total cholesterol is a rough measure of all the cholesterol in your blood.  LDL (low-density lipoprotein) is the "bad" cholesterol. This is the type that causes plaque to build up on the artery walls. You want this level to be low.  HDL (high-density lipoprotein) is the "good" cholesterol because it cleans the arteries and carries the LDL away. You want this level to be high.  Triglycerides are fat that the body can either burn for energy or store. High levels are closely linked to heart disease. What are the desired levels of cholesterol?  Total cholesterol below 200.  LDL below 100 for people who are at risk, below 70 for people at very high risk.  HDL above 40 is good. A level of 60 or higher is considered to be protective against heart disease.  Triglycerides below 150. How can I lower my cholesterol? Diet Follow your diet program as told by your health care provider.  Choose fish or white meat chicken and [place], roasted or baked. Limit fatty cuts of red meat, fried foods, and processed meats, such as sausage and lunch meats.  Eat lots of fresh fruits and vegetables.  Choose whole grains, beans, pasta, potatoes, and  cereals.  Choose olive oil, corn oil, or canola oil, and use only small amounts.  Avoid butter, mayonnaise, shortening, or palm kernel oils.  Avoid foods with trans fats.  Drink skim or nonfat milk and eat low-fat or nonfat yogurt and cheeses. Avoid whole milk, cream, ice cream, egg yolks, and full-fat cheeses.  Healthier desserts include angel food cake, ginger snaps, animal crackers, hard candy, popsicles, and low-fat or nonfat frozen yogurt. Avoid pastries, cakes, pies, and cookies.  Exercise  Follow your exercise program as told by your health care provider. A regular program: ? Helps to decrease LDL and raise HDL. ? Helps with weight control.  Do things that increase your activity level, such as gardening, walking, and taking the stairs.  Ask your health care provider about ways that you can be more active in your daily life.  Medicine  Take over-the-counter and prescription medicines only as told by your health care provider. ? Medicine may be prescribed by your health care provider to help lower cholesterol and decrease the risk for heart disease. This is usually done if diet and exercise have failed to bring down cholesterol levels. ? If you have several risk factors, you may need medicine even if your levels are normal.  This information is not intended to replace advice given to you by your health care provider. Make sure you discuss any questions you have with your health care provider. Document Released: 03/05/2001 Document Revised: 01/06/2016 Document Reviewed: 12/09/2015 Elsevier Interactive Patient Education  2018 Elsevier Inc.  

## 2017-09-01 NOTE — Assessment & Plan Note (Signed)
Well controlled, no changes to meds. Encouraged heart healthy diet such as the DASH diet and exercise as tolerated.  °

## 2017-09-01 NOTE — Assessment & Plan Note (Signed)
Started on Pantoprazole she tried her husband's and it worked well. Prescription is given

## 2017-09-01 NOTE — Assessment & Plan Note (Addendum)
Encouraged DASH diet, decrease po intake and increase exercise as tolerated. Needs 7-8 hours of sleep nightly. Avoid trans fats, eat small, frequent meals every 4-5 hours with lean proteins, complex carbs and healthy fats. Minimize simple carbs, bariatric referral offered. Or consider DASH or MIND diet

## 2017-09-01 NOTE — Assessment & Plan Note (Addendum)
Doing well on current meds, no changes

## 2017-09-02 MED ORDER — PANTOPRAZOLE SODIUM 40 MG PO TBEC: 40.0000 mg | DELAYED_RELEASE_TABLET | Freq: Every day | ORAL | 1 refills | Status: DC

## 2017-12-26 ENCOUNTER — Other Ambulatory Visit: Payer: Self-pay | Admitting: Family Medicine

## 2017-12-26 DIAGNOSIS — M791 Myalgia, unspecified site: Secondary | ICD-10-CM

## 2017-12-26 DIAGNOSIS — Z780 Asymptomatic menopausal state: Secondary | ICD-10-CM

## 2017-12-26 DIAGNOSIS — Z23 Encounter for immunization: Secondary | ICD-10-CM

## 2017-12-26 DIAGNOSIS — K219 Gastro-esophageal reflux disease without esophagitis: Secondary | ICD-10-CM

## 2017-12-26 DIAGNOSIS — I10 Essential (primary) hypertension: Secondary | ICD-10-CM

## 2017-12-26 DIAGNOSIS — F418 Other specified anxiety disorders: Secondary | ICD-10-CM

## 2018-01-06 ENCOUNTER — Telehealth: Payer: Self-pay | Admitting: Family Medicine

## 2018-01-06 ENCOUNTER — Telehealth: Payer: Self-pay | Admitting: *Deleted

## 2018-01-06 DIAGNOSIS — I10 Essential (primary) hypertension: Secondary | ICD-10-CM

## 2018-01-06 MED ORDER — IRBESARTAN-HYDROCHLOROTHIAZIDE 300-12.5 MG PO TABS: 1.0000 | ORAL_TABLET | Freq: Every day | ORAL | 0 refills | Status: DC

## 2018-01-06 NOTE — Telephone Encounter (Signed)
polytrim 2 drops in eye tid x 7 days apply after hot compress for 10 minutes

## 2018-01-06 NOTE — Telephone Encounter (Signed)
Copied from [place] [id number]. Topic: General - Other >> Jan 06, 2018  7:09 AM Lennox Solders wrote: Reason for CRM: pt is calling and has a sty on right eye on upper lid. Pt went to pharm and got some sty ointment OTC. Pt is out of town until 01/08/18 and [facility] [place] on [address]. Pt has had a sty on her eye in past

## 2018-01-06 NOTE — Telephone Encounter (Signed)
Sent in medication in patient made aware

## 2018-01-06 NOTE — Telephone Encounter (Signed)
Please advise on medication for stye on the  Eye

## 2018-01-06 NOTE — Telephone Encounter (Signed)
Irbesartanhct 300/25 1 tab daily, disp #90 no refills needs bp check with cmp in 2 weeks

## 2018-01-06 NOTE — Telephone Encounter (Signed)
[facility] faxed over a request to change Olmesartan-hctz.  Olmesartan is unavailable all forms.    Available alternatives all strengths of valsartan-hctz and candesartan-hctz.  Please advise

## 2018-01-06 NOTE — Telephone Encounter (Signed)
Please advise on strength

## 2018-01-08 ENCOUNTER — Ambulatory Visit (INDEPENDENT_AMBULATORY_CARE_PROVIDER_SITE_OTHER): Payer: Managed Care, Other (non HMO) | Admitting: Family Medicine

## 2018-01-08 ENCOUNTER — Encounter: Payer: Self-pay | Admitting: Family Medicine

## 2018-01-08 DIAGNOSIS — I10 Essential (primary) hypertension: Secondary | ICD-10-CM | POA: Diagnosis not present

## 2018-01-08 DIAGNOSIS — H00011 Hordeolum externum right upper eyelid: Secondary | ICD-10-CM | POA: Diagnosis not present

## 2018-01-08 MED ORDER — CEPHALEXIN 500 MG PO CAPS: 500.0000 mg | ORAL_CAPSULE | Freq: Three times a day (TID) | ORAL | 0 refills | Status: DC

## 2018-01-08 MED ORDER — POLYMYXIN B-TRIMETHOPRIM 10000-0.1 UNIT/ML-% OP SOLN: 2.0000 [drp] | Freq: Four times a day (QID) | OPHTHALMIC | 0 refills | Status: DC

## 2018-01-08 NOTE — Telephone Encounter (Signed)
Patient seen by PCP today.

## 2018-01-08 NOTE — Patient Instructions (Signed)

## 2018-01-08 NOTE — Progress Notes (Signed)
Subjective:  I acted as a Education administrator for Dr. Charlett Blake. [place], [place]  [name], female    DOB: 02/05/1956, 62 y.o.   MRN: [id number]  Chief Complaint  Patient presents with  . Stye    RIght eye    HPI  Patient is in today for an acute visit for a stye on her right eye lid.  She has been struggling with pain and swelling in her right upper eyelid this week.  She is tried hot compresses and over-the-counter petroleum-based stye treatment and it only seems to be worsening.  It is swollen and mildly uncomfortable.  There is some eye discharge at times as well.  No significant eye pain or change in vision.  No trauma or recent fever.  No recent hospitalization or febrile illness. Denies CP/palp/SOB/HA/congestion/fevers/GI or GU c/o. Taking meds as prescribed  Patient Care Team: Mosie Lukes, MD as PCP - General (Family Medicine) Vania Rea, MD as Consulting Physician (Obstetrics and Gynecology)   Past Medical History:  Diagnosis Date  . Adenomatous colon polyp   . Allergic rhinitis   . Allergy    late fall, spring   . Anemia    as teenager   . Breast abscess 09/22/2014   recurrent  . Colon polyps   . Depression    pt denies- uses venlafaxine with menopause   . Facial lesion 06/2010   removed from right side of face--precancerous  . GERD (gastroesophageal reflux disease)   . Hypertension   . Hypoglycemia   . Hypoglycemia   . Osteoarthritis    knees neck and hands- OA  . Pain of left heel 02/17/2017  . Preventative health care 03/07/2014  . Snoring 2008   negative sleep study of OSA  . Tendonitis 08/16/2016  . Visual disturbance    visual flashes, referred to [facility] (Normal funduscopic exam)    Past Surgical History:  Procedure Laterality Date  . ABDOMINAL HYSTERECTOMY  2002  . CARPAL TUNNEL RELEASE  05/2010, 06/2010   bilateral--Dr Tamera Punt  . CERVICAL FUSION  06/15/09   right sided C6-C7 radiculopathy status post anterior cervical  diskectomy and fusion at the C5-6 and C6-7 levels. Phylliss Bob, MD  . CHOLECYSTECTOMY N/A 10/10/2016   Procedure: LAPAROSCOPIC CHOLECYSTECTOMY;  Surgeon: Autumn Messing III, MD;  Location: [facility];  Service: General;  Laterality: N/A;  . COLONOSCOPY    . DORSAL COMPARTMENT RELEASE Left 02/06/2015   Procedure: DEQUERVAIN'S RELEASE;  Surgeon: Milly Jakob, MD;  Location: [facility];  Service: Orthopedics;  Laterality: Left;  . FRACTURE SURGERY  08/12/2014 & 02/06/2015   Original break in Feb & add'l surg in Aug  Plate in Left arm  . HARDWARE REMOVAL Left 02/06/2015   Procedure: LEFT WRIST REMOVAL OF HARDWARE;  Surgeon: Milly Jakob, MD;  Location: [facility];  Service: Orthopedics;  Laterality: Left;  . Left wrist surgery     Feb.19th 2016  . NASAL SEPTUM SURGERY    . SPINE SURGERY  5 +/- yrs ago   Neck vertebrae fusion  . Surgery on left ankle for fracture      Family History  Problem Relation Age of Onset  . Stroke Mother   . Colon cancer Mother   . Hypertension Mother   . Hyperlipidemia Mother   . Arthritis Mother        OA and rheumatoid  . Cancer Mother        colon  . Heart disease  Mother        MI 37  . Colon polyps Mother   . Alcohol abuse Father   . Heart disease Father   . Hypertension Father   . Colon polyps Father   . Arthritis Maternal Grandmother        RA and OA  . Diabetes Maternal Grandmother   . Heart disease Maternal Grandmother   . Hypertension Maternal Grandmother   . Stroke Maternal Grandmother   . Heart disease Maternal Grandfather   . Hypertension Maternal Grandfather   . Diabetes Paternal Grandfather   . Esophageal cancer Neg Hx   . Rectal cancer Neg Hx   . Stomach cancer Neg Hx     Social History   Socioeconomic History  . Marital status: Married    Spouse name: Not on file  . Number of children: Not on file  . Years of education: Not on file  . Highest education level: Not on file  Occupational History  .  Occupation: Primary school teacher for [employer]  . Financial resource strain: Not on file  . Food insecurity:    Worry: Not on file    Inability: Not on file  . Transportation needs:    Medical: Not on file    Non-medical: Not on file  Tobacco Use  . Smoking status: Never Smoker  . Smokeless tobacco: Never Used  Substance and Sexual Activity  . Alcohol use: No  . Drug use: No  . Sexual activity: Yes    Comment: lives with husband, no dietary restrictions  Lifestyle  . Physical activity:    Days per week: Not on file    Minutes per session: Not on file  . Stress: Not on file  Relationships  . Social connections:    Talks on phone: Not on file    Gets together: Not on file    Attends religious service: Not on file    Active member of club or organization: Not on file    Attends meetings of clubs or organizations: Not on file    Relationship status: Not on file  . Intimate partner violence:    Fear of current or ex partner: Not on file    Emotionally abused: Not on file    Physically abused: Not on file    Forced sexual activity: Not on file  Other Topics Concern  . Not on file  Social History Narrative  . Not on file    Outpatient Medications Prior to Visit  Medication Sig Dispense Refill  . aspirin 81 MG EC tablet Take 81 mg by mouth daily.      Marland Kitchen estradiol (CLIMARA - DOSED IN MG/24 HR) 0.025 mg/24hr patch Place 1 patch (0.025 mg total) onto the skin once a week. (Patient taking differently: Place 0.025 mg onto the skin every Monday. ) 12 patch 2  . fexofenadine (ALLEGRA) 180 MG tablet Take 180 mg by mouth at bedtime as needed for allergies.     Marland Kitchen gabapentin (NEURONTIN) 300 MG capsule Take 1 capsule (300 mg total) by mouth 2 (two) times daily. 180 capsule 2  . irbesartan-hydrochlorothiazide (AVALIDE) 300-12.5 MG tablet Take 1 tablet by mouth daily. 90 tablet 0  . Krill Oil 350 MG CAPS Take 350 mg by mouth daily.    . Misc Natural Products  (GLUCOSAMINE CHONDROITIN TRIPLE) TABS Take 1 tablet by mouth 2 (two) times daily.    . pantoprazole (PROTONIX) 40 MG tablet Take 1 tablet (40 mg total)  by mouth daily. 90 tablet 1  . Soy Isoflavones 40 MG TABS Take 40 mg by mouth 2 (two) times daily.     Marland Kitchen venlafaxine XR (EFFEXOR-XR) 75 MG 24 hr capsule TAKE 1 CAPSULE BY MOUTH DAILY WITH BREAKFAST 90 capsule 3  . Probiotic Product (DIGESTIVE ADVANTAGE) CAPS Take 1 capsule by mouth daily.     No facility-administered medications prior to visit.     No Known Allergies  Review of Systems  Constitutional: Negative for fever and malaise/fatigue.  HENT: Negative for congestion.   Eyes: Positive for discharge. Negative for blurred vision, double vision, photophobia, pain and redness.  Respiratory: Negative for shortness of breath.   Cardiovascular: Negative for chest pain and palpitations.  Gastrointestinal: Negative for abdominal pain, blood in stool and nausea.  Genitourinary: Negative for dysuria.  Skin: Negative for rash.  Neurological: Negative for dizziness and headaches.  Endo/Heme/Allergies: Negative for environmental allergies.       Objective:    Physical Exam  Constitutional: She is oriented to person, place, and time. No distress.  HENT:  Head: Normocephalic and atraumatic.  Eyes: Conjunctivae are normal.  Neck: Neck supple. No thyromegaly present.  Cardiovascular: Normal rate, regular rhythm and normal heart sounds.  No murmur heard. Pulmonary/Chest: Effort normal and breath sounds normal. She has no wheezes.  Abdominal: She exhibits no distension and no mass.  Musculoskeletal: She exhibits no edema.  Lymphadenopathy:    She has no cervical adenopathy.  Neurological: She is alert and oriented to person, place, and time.  Skin: Skin is warm and dry. No rash noted. She is not diaphoretic.  Psychiatric: Judgment normal.    BP 118/68 (BP Location: Left Arm, Patient Position: Sitting, Cuff Size: Normal)   Pulse 68    Temp 97.8 F (36.6 C) (Oral)   Resp 18   Wt 215 lb 6.4 oz (97.7 kg)   SpO2 98%   BMI 37.56 kg/m  Wt Readings from Last 3 Encounters:  01/08/18 215 lb 6.4 oz (97.7 kg)  09/01/17 210 lb 12.8 oz (95.6 kg)  04/07/17 210 lb (95.3 kg)   BP Readings from Last 3 Encounters:  01/08/18 118/68  09/01/17 100/60  04/07/17 113/70     Immunization History  Administered Date(s) Administered  . Influenza Split 05/14/2011, 03/26/2012  . Influenza Whole 05/02/2010  . Influenza,inj,Quad PF,6+ Mos 03/07/2014, 08/18/2015, 02/17/2017  . Influenza-Unspecified 03/24/2012, 01/22/2017  . Td 07/27/2007    Health Maintenance  Topic Date Due  . Hepatitis C Screening  1956-05-28  . PNEUMOCOCCAL POLYSACCHARIDE VACCINE (1) 12/20/1957  . FOOT EXAM  12/20/1965  . OPHTHALMOLOGY EXAM  12/20/1965  . HIV Screening  12/21/1970  . TETANUS/TDAP  07/26/2017  . INFLUENZA VACCINE  01/22/2018  . HEMOGLOBIN A1C  02/25/2018  . MAMMOGRAM  04/03/2019  . COLONOSCOPY  04/07/2020    Lab Results  Component Value Date   WBC 5.0 08/25/2017   HGB 14.5 08/25/2017   HCT 42.6 08/25/2017   PLT 211.0 08/25/2017   GLUCOSE 81 08/25/2017   CHOL 193 08/25/2017   TRIG 212.0 (H) 08/25/2017   HDL 44.00 08/25/2017   LDLDIRECT 125.0 08/25/2017   LDLCALC 92 02/10/2017   ALT 12 08/25/2017   AST 13 08/25/2017   NA 141 08/25/2017   K 3.8 08/25/2017   CL 101 08/25/2017   CREATININE 1.02 08/25/2017   BUN 16 08/25/2017   CO2 34 (H) 08/25/2017   TSH 2.15 08/25/2017   INR 0.97 06/13/2009   HGBA1C 5.2 08/25/2017  Lab Results  Component Value Date   TSH 2.15 08/25/2017   Lab Results  Component Value Date   WBC 5.0 08/25/2017   HGB 14.5 08/25/2017   HCT 42.6 08/25/2017   MCV 89.6 08/25/2017   PLT 211.0 08/25/2017   Lab Results  Component Value Date   NA 141 08/25/2017   K 3.8 08/25/2017   CO2 34 (H) 08/25/2017   GLUCOSE 81 08/25/2017   BUN 16 08/25/2017   CREATININE 1.02 08/25/2017   BILITOT 0.4 08/25/2017    ALKPHOS 91 08/25/2017   AST 13 08/25/2017   ALT 12 08/25/2017   PROT 7.1 08/25/2017   ALBUMIN 3.8 08/25/2017   CALCIUM 9.9 08/25/2017   ANIONGAP 9 10/09/2016   GFR 58.42 (L) 08/25/2017   Lab Results  Component Value Date   CHOL 193 08/25/2017   Lab Results  Component Value Date   HDL 44.00 08/25/2017   Lab Results  Component Value Date   LDLCALC 92 02/10/2017   Lab Results  Component Value Date   TRIG 212.0 (H) 08/25/2017   Lab Results  Component Value Date   CHOLHDL 4 08/25/2017   Lab Results  Component Value Date   HGBA1C 5.2 08/25/2017         Assessment & Plan:   Problem List Items Addressed This Visit    Essential hypertension    Well controlled, no changes to meds. Encouraged heart healthy diet such as the DASH diet and exercise as tolerated.       Hordeolum    Swollen and mildly tender. Is started on Keflex. She has been using a petroleum based OTC stye product she will stop it. Is given an rx for poytrim to start as well. She has an appointment with opthamology if symptoms do not improve.         I have discontinued Ranae Palms. [employer] "Cindy"'s DIGESTIVE ADVANTAGE. I am also having her start on trimethoprim-polymyxin b and cephALEXin. Additionally, I am having her maintain her fexofenadine, Soy Isoflavones, aspirin, estradiol, Krill Oil, Glucosamine Chondroitin Triple, gabapentin, pantoprazole, venlafaxine XR, and irbesartan-hydrochlorothiazide.  Meds ordered this encounter  Medications  . trimethoprim-polymyxin b (POLYTRIM) ophthalmic solution    Sig: Place 2 drops into the right eye every 6 (six) hours.    Dispense:  10 mL    Refill:  0  . cephALEXin (KEFLEX) 500 MG capsule    Sig: Take 1 capsule (500 mg total) by mouth 3 (three) times daily.    Dispense:  30 capsule    Refill:  0    CMA served as scribe during this visit. History, Physical and Plan performed by medical provider. Documentation and orders reviewed and attested to.  Penni Homans, MD

## 2018-01-11 DIAGNOSIS — H00019 Hordeolum externum unspecified eye, unspecified eyelid: Secondary | ICD-10-CM | POA: Insufficient documentation

## 2018-01-11 NOTE — Assessment & Plan Note (Signed)
Well controlled, no changes to meds. Encouraged heart healthy diet such as the DASH diet and exercise as tolerated.  °

## 2018-01-11 NOTE — Assessment & Plan Note (Signed)
Swollen and mildly tender. Is started on Keflex. She has been using a petroleum based OTC stye product she will stop it. Is given an rx for poytrim to start as well. She has an appointment with opthamology if symptoms do not improve.

## 2018-02-25 ENCOUNTER — Other Ambulatory Visit: Payer: Self-pay | Admitting: Family Medicine

## 2018-02-25 DIAGNOSIS — I10 Essential (primary) hypertension: Secondary | ICD-10-CM

## 2018-02-25 DIAGNOSIS — Z23 Encounter for immunization: Secondary | ICD-10-CM

## 2018-02-25 DIAGNOSIS — M791 Myalgia, unspecified site: Secondary | ICD-10-CM

## 2018-02-25 DIAGNOSIS — Z780 Asymptomatic menopausal state: Secondary | ICD-10-CM

## 2018-03-02 ENCOUNTER — Other Ambulatory Visit (INDEPENDENT_AMBULATORY_CARE_PROVIDER_SITE_OTHER): Payer: Managed Care, Other (non HMO)

## 2018-03-02 DIAGNOSIS — I10 Essential (primary) hypertension: Secondary | ICD-10-CM

## 2018-03-02 DIAGNOSIS — E782 Mixed hyperlipidemia: Secondary | ICD-10-CM | POA: Diagnosis not present

## 2018-03-02 LAB — COMPREHENSIVE METABOLIC PANEL
ALK PHOS: 81 U/L (ref 39–117)
ALT: 12 U/L (ref 0–35)
AST: 12 U/L (ref 0–37)
Albumin: 4 g/dL (ref 3.5–5.2)
BUN: 15 mg/dL (ref 6–23)
CO2: 33 mEq/L — ABNORMAL HIGH (ref 19–32)
Calcium: 9.2 mg/dL (ref 8.4–10.5)
Chloride: 103 mEq/L (ref 96–112)
Creatinine, Ser: 1.02 mg/dL (ref 0.40–1.20)
GFR: 58.33 mL/min — ABNORMAL LOW (ref >60.00)
GLUCOSE: 115 mg/dL — AB (ref 70–99)
POTASSIUM: 3.8 meq/L (ref 3.5–5.1)
SODIUM: 142 meq/L (ref 135–145)
TOTAL PROTEIN: 6.3 g/dL (ref 6.0–8.3)
Total Bilirubin: 0.4 mg/dL (ref 0.2–1.2)

## 2018-03-02 LAB — CBC
HEMATOCRIT: 39.9 % (ref 36.0–46.0)
HEMOGLOBIN: 13.8 g/dL (ref 12.0–15.0)
MCHC: 34.6 g/dL (ref 30.0–36.0)
MCV: 87.7 fl (ref 78.0–100.0)
Platelets: 181 10*3/uL (ref 150.0–400.0)
RBC: 4.55 Mil/uL (ref 3.87–5.11)
RDW: 12.8 % (ref 11.5–15.5)
WBC: 4.3 10*3/uL (ref 4.0–10.5)

## 2018-03-02 LAB — LIPID PANEL
CHOL/HDL RATIO: 4
Cholesterol: 175 mg/dL (ref 0–200)
HDL: 43.4 mg/dL (ref >39.00)
LDL Cholesterol: 105 mg/dL — ABNORMAL HIGH (ref 0–99)
NonHDL: 131.85
TRIGLYCERIDES: 132 mg/dL (ref 0.0–149.0)
VLDL: 26.4 mg/dL (ref 0.0–40.0)

## 2018-03-02 LAB — TSH: TSH: 1.68 u[IU]/mL (ref 0.35–4.50)

## 2018-03-09 ENCOUNTER — Encounter: Payer: Self-pay | Admitting: Family Medicine

## 2018-03-09 ENCOUNTER — Ambulatory Visit (INDEPENDENT_AMBULATORY_CARE_PROVIDER_SITE_OTHER): Payer: Managed Care, Other (non HMO) | Admitting: Family Medicine

## 2018-03-09 VITALS — BP 100/62 | HR 69 | Temp 98.6°F | Resp 18 | Ht 64.0 in | Wt 210.8 lb

## 2018-03-09 DIAGNOSIS — R739 Hyperglycemia, unspecified: Secondary | ICD-10-CM | POA: Insufficient documentation

## 2018-03-09 DIAGNOSIS — Z23 Encounter for immunization: Secondary | ICD-10-CM | POA: Diagnosis not present

## 2018-03-09 DIAGNOSIS — E6609 Other obesity due to excess calories: Secondary | ICD-10-CM

## 2018-03-09 DIAGNOSIS — E782 Mixed hyperlipidemia: Secondary | ICD-10-CM

## 2018-03-09 DIAGNOSIS — I10 Essential (primary) hypertension: Secondary | ICD-10-CM

## 2018-03-09 MED ORDER — PANTOPRAZOLE SODIUM 40 MG PO TBEC: 40.0000 mg | DELAYED_RELEASE_TABLET | Freq: Every day | ORAL | 1 refills | Status: DC

## 2018-03-09 NOTE — Assessment & Plan Note (Signed)
Encouraged heart healthy diet, increase exercise, avoid trans fats, consider a krill oil cap daily 

## 2018-03-09 NOTE — Assessment & Plan Note (Signed)
hgba1c acceptable, minimize simple carbs. Increase exercise as tolerated.  

## 2018-03-09 NOTE — Assessment & Plan Note (Signed)
Encouraged DASH or Obesity diet, decrease po intake and increase exercise as tolerated. Needs 7-8 hours of sleep nightly. Avoid trans fats, eat small, frequent meals every 4-5 hours with lean proteins, complex carbs and healthy fats. Minimize simple carbs

## 2018-03-09 NOTE — Progress Notes (Signed)
Subjective:  I acted as a Education administrator for Dr. Charlett Blake. [place], [place]  [name], female    DOB: 06-24-1956, 62 y.o.   MRN: [id number]  No chief complaint on file.   HPI  Patient is in today for a 6 month follow up. She is following up on her HTN and other medical concerns. She has no acute concerns. States she is doing well. No recent febrile illness or acute hospitalizations. Denies CP/palp/SOB/HA/congestion/fevers/GI or GU c/o. Taking meds as prescribed. Working too many hours but otherwise feeling well. Has a retirement date in 2021   Patient Care Team: Mosie Lukes, MD as PCP - General (Family Medicine) Vania Rea, MD as Consulting Physician (Obstetrics and Gynecology)   Past Medical History:  Diagnosis Date  . Adenomatous colon polyp   . Allergic rhinitis   . Allergy    late fall, spring   . Anemia    as teenager   . Breast abscess 09/22/2014   recurrent  . Colon polyps   . Depression    pt denies- uses venlafaxine with menopause   . Facial lesion 06/2010   removed from right side of face--precancerous  . GERD (gastroesophageal reflux disease)   . Hypertension   . Hypoglycemia   . Hypoglycemia   . Osteoarthritis    knees neck and hands- OA  . Pain of left heel 02/17/2017  . Preventative health care 03/07/2014  . Snoring 2008   negative sleep study of OSA  . Tendonitis 08/16/2016  . Visual disturbance    visual flashes, referred to [facility] (Normal funduscopic exam)    Past Surgical History:  Procedure Laterality Date  . ABDOMINAL HYSTERECTOMY  2002  . CARPAL TUNNEL RELEASE  05/2010, 06/2010   bilateral--Dr Tamera Punt  . CERVICAL FUSION  06/15/09   right sided C6-C7 radiculopathy status post anterior cervical diskectomy and fusion at the C5-6 and C6-7 levels. Phylliss Bob, MD  . CHOLECYSTECTOMY N/A 10/10/2016   Procedure: LAPAROSCOPIC CHOLECYSTECTOMY;  Surgeon: Autumn Messing III, MD;  Location: [facility];  Service: General;  Laterality: N/A;   . COLONOSCOPY    . DORSAL COMPARTMENT RELEASE Left 02/06/2015   Procedure: DEQUERVAIN'S RELEASE;  Surgeon: Milly Jakob, MD;  Location: [place];  Service: Orthopedics;  Laterality: Left;  . FRACTURE SURGERY  08/12/2014 & 02/06/2015   Original break in Feb & add'l surg in Aug  Plate in Left arm  . HARDWARE REMOVAL Left 02/06/2015   Procedure: LEFT WRIST REMOVAL OF HARDWARE;  Surgeon: Milly Jakob, MD;  Location: [facility];  Service: Orthopedics;  Laterality: Left;  . Left wrist surgery     Feb.19th 2016  . NASAL SEPTUM SURGERY    . SPINE SURGERY  5 +/- yrs ago   Neck vertebrae fusion  . Surgery on left ankle for fracture      Family History  Problem Relation Age of Onset  . Stroke Mother   . Colon cancer Mother   . Hypertension Mother   . Hyperlipidemia Mother   . Arthritis Mother        OA and rheumatoid  . Cancer Mother        colon  . Heart disease Mother        MI 80  . Colon polyps Mother   . Alcohol abuse Father   . Heart disease Father   . Hypertension Father   . Colon polyps Father   . Arthritis Maternal Grandmother  RA and OA  . Diabetes Maternal Grandmother   . Heart disease Maternal Grandmother   . Hypertension Maternal Grandmother   . Stroke Maternal Grandmother   . Heart disease Maternal Grandfather   . Hypertension Maternal Grandfather   . Diabetes Paternal Grandfather   . Esophageal cancer Neg Hx   . Rectal cancer Neg Hx   . Stomach cancer Neg Hx     Social History   Socioeconomic History  . Marital status: Married    Spouse name: Not on file  . Number of children: Not on file  . Years of education: Not on file  . Highest education level: Not on file  Occupational History  . Occupation: Primary school teacher for [employer]  . Financial resource strain: Not on file  . Food insecurity:    Worry: Not on file    Inability: Not on file  . Transportation needs:    Medical: Not on  file    Non-medical: Not on file  Tobacco Use  . Smoking status: Never Smoker  . Smokeless tobacco: Never Used  Substance and Sexual Activity  . Alcohol use: No  . Drug use: No  . Sexual activity: Yes    Comment: lives with husband, no dietary restrictions  Lifestyle  . Physical activity:    Days per week: Not on file    Minutes per session: Not on file  . Stress: Not on file  Relationships  . Social connections:    Talks on phone: Not on file    Gets together: Not on file    Attends religious service: Not on file    Active member of club or organization: Not on file    Attends meetings of clubs or organizations: Not on file    Relationship status: Not on file  . Intimate partner violence:    Fear of current or ex partner: Not on file    Emotionally abused: Not on file    Physically abused: Not on file    Forced sexual activity: Not on file  Other Topics Concern  . Not on file  Social History Narrative  . Not on file    Outpatient Medications Prior to Visit  Medication Sig Dispense Refill  . aspirin 81 MG EC tablet Take 81 mg by mouth daily.      Marland Kitchen estradiol (CLIMARA - DOSED IN MG/24 HR) 0.025 mg/24hr patch Place 1 patch (0.025 mg total) onto the skin once a week. (Patient taking differently: Place 0.025 mg onto the skin every Monday. ) 12 patch 2  . fexofenadine (ALLEGRA) 180 MG tablet Take 180 mg by mouth at bedtime as needed for allergies.     Marland Kitchen gabapentin (NEURONTIN) 300 MG capsule TAKE 1 CAPSULE BY MOUTH TWICE DAILY 180 capsule 3  . irbesartan-hydrochlorothiazide (AVALIDE) 300-12.5 MG tablet Take 1 tablet by mouth daily. 90 tablet 0  . Krill Oil 350 MG CAPS Take 350 mg by mouth daily.    . Misc Natural Products (GLUCOSAMINE CHONDROITIN TRIPLE) TABS Take 1 tablet by mouth 2 (two) times daily.    . Soy Isoflavones 40 MG TABS Take 40 mg by mouth 2 (two) times daily.     Marland Kitchen venlafaxine XR (EFFEXOR-XR) 75 MG 24 hr capsule TAKE 1 CAPSULE BY MOUTH DAILY WITH BREAKFAST 90  capsule 3  . pantoprazole (PROTONIX) 40 MG tablet Take 1 tablet (40 mg total) by mouth daily. 90 tablet 1  . cephALEXin (KEFLEX) 500 MG capsule Take 1 capsule (  500 mg total) by mouth 3 (three) times daily. 30 capsule 0  . trimethoprim-polymyxin b (POLYTRIM) ophthalmic solution Place 2 drops into the right eye every 6 (six) hours. 10 mL 0   No facility-administered medications prior to visit.     No Known Allergies  Review of Systems  Constitutional: Negative for fever and malaise/fatigue.  HENT: Negative for congestion.   Eyes: Negative for blurred vision.  Respiratory: Negative for shortness of breath.   Cardiovascular: Negative for chest pain, palpitations and leg swelling.  Gastrointestinal: Negative for abdominal pain, blood in stool and nausea.  Genitourinary: Negative for dysuria and frequency.  Musculoskeletal: Negative for falls.  Skin: Negative for rash.  Neurological: Negative for dizziness, loss of consciousness and headaches.  Endo/Heme/Allergies: Negative for environmental allergies.  Psychiatric/Behavioral: Negative for depression. The patient is not nervous/anxious.        Objective:    Physical Exam  BP 100/62 (BP Location: Left Arm, Patient Position: Sitting, Cuff Size: Normal)   Pulse 69   Temp 98.6 F (37 C) (Oral)   Resp 18   Ht 5\' 4"  (1.626 m)   Wt 210 lb 12.8 oz (95.6 kg)   SpO2 92%   BMI 36.18 kg/m  Wt Readings from Last 3 Encounters:  03/09/18 210 lb 12.8 oz (95.6 kg)  01/08/18 215 lb 6.4 oz (97.7 kg)  09/01/17 210 lb 12.8 oz (95.6 kg)   BP Readings from Last 3 Encounters:  03/09/18 100/62  01/08/18 118/68  09/01/17 100/60     Immunization History  Administered Date(s) Administered  . Influenza Split 05/14/2011, 03/26/2012  . Influenza Whole 05/02/2010  . Influenza,inj,Quad PF,6+ Mos 03/07/2014, 08/18/2015, 02/17/2017, 03/09/2018  . Influenza-Unspecified 03/24/2012, 01/22/2017  . Td 07/27/2007    Health Maintenance  Topic Date Due    . Hepatitis C Screening  Oct 30, 1955  . PNEUMOCOCCAL POLYSACCHARIDE VACCINE AGE 75-64 HIGH RISK  12/20/1957  . FOOT EXAM  12/20/1965  . OPHTHALMOLOGY EXAM  12/20/1965  . HIV Screening  12/21/1970  . TETANUS/TDAP  07/26/2017  . INFLUENZA VACCINE  01/22/2018  . HEMOGLOBIN A1C  02/25/2018  . MAMMOGRAM  04/03/2019  . COLONOSCOPY  04/07/2020    Lab Results  Component Value Date   WBC 4.3 03/02/2018   HGB 13.8 03/02/2018   HCT 39.9 03/02/2018   PLT 181.0 03/02/2018   GLUCOSE 115 (H) 03/02/2018   CHOL 175 03/02/2018   TRIG 132.0 03/02/2018   HDL 43.40 03/02/2018   LDLDIRECT 125.0 08/25/2017   LDLCALC 105 (H) 03/02/2018   ALT 12 03/02/2018   AST 12 03/02/2018   NA 142 03/02/2018   K 3.8 03/02/2018   CL 103 03/02/2018   CREATININE 1.02 03/02/2018   BUN 15 03/02/2018   CO2 33 (H) 03/02/2018   TSH 1.68 03/02/2018   INR 0.97 06/13/2009   HGBA1C 5.2 08/25/2017    Lab Results  Component Value Date   TSH 1.68 03/02/2018   Lab Results  Component Value Date   WBC 4.3 03/02/2018   HGB 13.8 03/02/2018   HCT 39.9 03/02/2018   MCV 87.7 03/02/2018   PLT 181.0 03/02/2018   Lab Results  Component Value Date   NA 142 03/02/2018   K 3.8 03/02/2018   CO2 33 (H) 03/02/2018   GLUCOSE 115 (H) 03/02/2018   BUN 15 03/02/2018   CREATININE 1.02 03/02/2018   BILITOT 0.4 03/02/2018   ALKPHOS 81 03/02/2018   AST 12 03/02/2018   ALT 12 03/02/2018   PROT 6.3 03/02/2018  ALBUMIN 4.0 03/02/2018   CALCIUM 9.2 03/02/2018   ANIONGAP 9 10/09/2016   GFR 58.33 (L) 03/02/2018   Lab Results  Component Value Date   CHOL 175 03/02/2018   Lab Results  Component Value Date   HDL 43.40 03/02/2018   Lab Results  Component Value Date   LDLCALC 105 (H) 03/02/2018   Lab Results  Component Value Date   TRIG 132.0 03/02/2018   Lab Results  Component Value Date   CHOLHDL 4 03/02/2018   Lab Results  Component Value Date   HGBA1C 5.2 08/25/2017         Assessment & Plan:   Problem  List Items Addressed This Visit    Essential hypertension    Well controlled, no changes to meds. Encouraged heart healthy diet such as the DASH diet and exercise as tolerated.       Hyperlipidemia, mixed    Encouraged heart healthy diet, increase exercise, avoid trans fats, consider a krill oil cap daily      Obesity    Encouraged DASH or Obesity diet, decrease po intake and increase exercise as tolerated. Needs 7-8 hours of sleep nightly. Avoid trans fats, eat small, frequent meals every 4-5 hours with lean proteins, complex carbs and healthy fats. Minimize simple carbs      Hyperglycemia    hgba1c acceptable, minimize simple carbs. Increase exercise as tolerated.        Other Visit Diagnoses    Needs flu shot    -  Primary   Relevant Orders   Flu Vaccine QUAD 6+ mos PF IM (Fluarix Quad PF) (Completed)      I have discontinued Faythe Casa "Cindy"'s trimethoprim-polymyxin b and cephALEXin. I am also having her maintain her fexofenadine, Soy Isoflavones, aspirin, estradiol, Krill Oil, Glucosamine Chondroitin Triple, venlafaxine XR, irbesartan-hydrochlorothiazide, gabapentin, and pantoprazole.  Meds ordered this encounter  Medications  . DISCONTD: pantoprazole (PROTONIX) 40 MG tablet    Sig: Take 1 tablet (40 mg total) by mouth daily.    Dispense:  90 tablet    Refill:  1  . pantoprazole (PROTONIX) 40 MG tablet    Sig: Take 1 tablet (40 mg total) by mouth daily.    Dispense:  90 tablet    Refill:  1    CMA served as Education administrator during this visit. History, Physical and Plan performed by medical provider. Documentation and orders reviewed and attested to.  Penni Homans, MD

## 2018-03-09 NOTE — Assessment & Plan Note (Signed)
Well controlled, no changes to meds. Encouraged heart healthy diet such as the DASH diet and exercise as tolerated.  °

## 2018-03-09 NOTE — Patient Instructions (Addendum)
Shingrix is the new shingles shot 2 shots over 2-6 months, call insurance to check on coverage MIND diet  Hypertension Hypertension, commonly called high blood pressure, is when the force of blood pumping through the arteries is too strong. The arteries are the blood vessels that carry blood from the heart throughout the body. Hypertension forces the heart to work harder to pump blood and may cause arteries to become narrow or stiff. Having untreated or uncontrolled hypertension can cause heart attacks, strokes, kidney disease, and other problems. A blood pressure reading consists of a higher number over a lower number. Ideally, your blood pressure should be below 120/80. The first ("top") number is called the systolic pressure. It is a measure of the pressure in your arteries as your heart beats. The second ("bottom") number is called the diastolic pressure. It is a measure of the pressure in your arteries as the heart relaxes. What are the causes? The cause of this condition is not known. What increases the risk? Some risk factors for high blood pressure are under your control. Others are not. Factors you can change  Smoking.  Having type 2 diabetes mellitus, high cholesterol, or both.  Not getting enough exercise or physical activity.  Being overweight.  Having too much fat, sugar, calories, or salt (sodium) in your diet.  Drinking too much alcohol. Factors that are difficult or impossible to change  Having chronic kidney disease.  Having a family history of high blood pressure.  Age. Risk increases with age.  Race. You may be at higher risk if you are African-American.  Gender. Men are at higher risk than women before age 84. After age 63, women are at higher risk than men.  Having obstructive sleep apnea.  Stress. What are the signs or symptoms? Extremely high blood pressure (hypertensive crisis) may cause:  Headache.  Anxiety.  Shortness of  breath.  Nosebleed.  Nausea and vomiting.  Severe chest pain.  Jerky movements you cannot control (seizures).  How is this diagnosed? This condition is diagnosed by measuring your blood pressure while you are seated, with your arm resting on a surface. The cuff of the blood pressure monitor will be placed directly against the skin of your upper arm at the level of your heart. It should be measured at least twice using the same arm. Certain conditions can cause a difference in blood pressure between your right and left arms. Certain factors can cause blood pressure readings to be lower or higher than normal (elevated) for a short period of time:  When your blood pressure is higher when you are in a health care provider's office than when you are at home, this is called white coat hypertension. Most people with this condition do not need medicines.  When your blood pressure is higher at home than when you are in a health care provider's office, this is called masked hypertension. Most people with this condition may need medicines to control blood pressure.  If you have a high blood pressure reading during one visit or you have normal blood pressure with other risk factors:  You may be asked to return on a different day to have your blood pressure checked again.  You may be asked to monitor your blood pressure at home for 1 week or longer.  If you are diagnosed with hypertension, you may have other blood or imaging tests to help your health care provider understand your overall risk for other conditions. How is this treated? This condition  is treated by making healthy lifestyle changes, such as eating healthy foods, exercising more, and reducing your alcohol intake. Your health care provider may prescribe medicine if lifestyle changes are not enough to get your blood pressure under control, and if:  Your systolic blood pressure is above 130.  Your diastolic blood pressure is above  80.  Your personal target blood pressure may vary depending on your medical conditions, your age, and other factors. Follow these instructions at home: Eating and drinking  Eat a diet that is high in fiber and potassium, and low in sodium, added sugar, and fat. An example eating plan is called the DASH (Dietary Approaches to Stop Hypertension) diet. To eat this way: ? Eat plenty of fresh fruits and vegetables. Try to fill half of your plate at each meal with fruits and vegetables. ? Eat whole grains, such as whole wheat pasta, brown rice, or whole grain bread. Fill about one quarter of your plate with whole grains. ? Eat or drink low-fat dairy products, such as skim milk or low-fat yogurt. ? Avoid fatty cuts of meat, processed or cured meats, and poultry with skin. Fill about one quarter of your plate with lean proteins, such as fish, chicken without skin, beans, eggs, and tofu. ? Avoid premade and processed foods. These tend to be higher in sodium, added sugar, and fat.  Reduce your daily sodium intake. Most people with hypertension should eat less than 1,500 mg of sodium a day.  Limit alcohol intake to no more than 1 drink a day for nonpregnant women and 2 drinks a day for men. One drink equals 12 oz of beer, 5 oz of wine, or 1 oz of hard liquor. Lifestyle  Work with your health care provider to maintain a healthy body weight or to lose weight. Ask what an ideal weight is for you.  Get at least 30 minutes of exercise that causes your heart to beat faster (aerobic exercise) most days of the week. Activities may include walking, swimming, or biking.  Include exercise to strengthen your muscles (resistance exercise), such as pilates or lifting weights, as part of your weekly exercise routine. Try to do these types of exercises for 30 minutes at least 3 days a week.  Do not use any products that contain nicotine or tobacco, such as cigarettes and e-cigarettes. If you need help quitting, ask  your health care provider.  Monitor your blood pressure at home as told by your health care provider.  Keep all follow-up visits as told by your health care provider. This is important. Medicines  Take over-the-counter and prescription medicines only as told by your health care provider. Follow directions carefully. Blood pressure medicines must be taken as prescribed.  Do not skip doses of blood pressure medicine. Doing this puts you at risk for problems and can make the medicine less effective.  Ask your health care provider about side effects or reactions to medicines that you should watch for. Contact a health care provider if:  You think you are having a reaction to a medicine you are taking.  You have headaches that keep coming back (recurring).  You feel dizzy.  You have swelling in your ankles.  You have trouble with your vision. Get help right away if:  You develop a severe headache or confusion.  You have unusual weakness or numbness.  You feel faint.  You have severe pain in your chest or abdomen.  You vomit repeatedly.  You have trouble breathing. Summary  Hypertension is when the force of blood pumping through your arteries is too strong. If this condition is not controlled, it may put you at risk for serious complications.  Your personal target blood pressure may vary depending on your medical conditions, your age, and other factors. For most people, a normal blood pressure is less than 120/80.  Hypertension is treated with lifestyle changes, medicines, or a combination of both. Lifestyle changes include weight loss, eating a healthy, low-sodium diet, exercising more, and limiting alcohol. This information is not intended to replace advice given to you by your health care provider. Make sure you discuss any questions you have with your health care provider. Document Released: 06/10/2005 Document Revised: 05/08/2016 Document Reviewed: 05/08/2016 Elsevier  Interactive Patient Education  [employer].

## 2018-03-13 ENCOUNTER — Ambulatory Visit (INDEPENDENT_AMBULATORY_CARE_PROVIDER_SITE_OTHER): Payer: Managed Care, Other (non HMO)

## 2018-03-13 DIAGNOSIS — Z23 Encounter for immunization: Secondary | ICD-10-CM | POA: Diagnosis not present

## 2018-03-25 ENCOUNTER — Telehealth: Payer: Managed Care, Other (non HMO) | Admitting: Nurse Practitioner

## 2018-03-25 DIAGNOSIS — R059 Cough, unspecified: Secondary | ICD-10-CM

## 2018-03-25 DIAGNOSIS — R05 Cough: Secondary | ICD-10-CM

## 2018-03-25 NOTE — Progress Notes (Signed)

## 2018-04-06 LAB — HM MAMMOGRAPHY

## 2018-04-09 ENCOUNTER — Telehealth: Payer: Self-pay | Admitting: *Deleted

## 2018-04-09 NOTE — Telephone Encounter (Signed)
Received Medical records from [facility]; forwarded to provider/SLS 10/17

## 2018-04-13 ENCOUNTER — Encounter: Payer: Self-pay | Admitting: Family Medicine

## 2018-04-16 ENCOUNTER — Encounter: Payer: Self-pay | Admitting: Family Medicine

## 2018-04-16 NOTE — Telephone Encounter (Signed)
MGM has been abstracted

## 2018-04-17 ENCOUNTER — Encounter: Payer: Self-pay | Admitting: Family Medicine

## 2018-04-24 ENCOUNTER — Telehealth: Payer: Self-pay | Admitting: *Deleted

## 2018-04-24 NOTE — Telephone Encounter (Signed)
Received Medical records from [facility]; forwarded to provider/SLS 11/01

## 2018-05-01 ENCOUNTER — Encounter: Payer: Self-pay | Admitting: Family Medicine

## 2018-05-03 ENCOUNTER — Other Ambulatory Visit: Payer: Self-pay | Admitting: Family Medicine

## 2018-05-13 ENCOUNTER — Ambulatory Visit: Payer: Managed Care, Other (non HMO)

## 2018-05-14 ENCOUNTER — Ambulatory Visit: Payer: Managed Care, Other (non HMO)

## 2018-05-18 ENCOUNTER — Ambulatory Visit: Payer: Managed Care, Other (non HMO)

## 2018-05-26 ENCOUNTER — Ambulatory Visit (INDEPENDENT_AMBULATORY_CARE_PROVIDER_SITE_OTHER): Payer: Managed Care, Other (non HMO)

## 2018-05-26 DIAGNOSIS — Z23 Encounter for immunization: Secondary | ICD-10-CM

## 2018-08-31 ENCOUNTER — Other Ambulatory Visit (INDEPENDENT_AMBULATORY_CARE_PROVIDER_SITE_OTHER): Payer: Managed Care, Other (non HMO)

## 2018-08-31 DIAGNOSIS — E782 Mixed hyperlipidemia: Secondary | ICD-10-CM

## 2018-08-31 DIAGNOSIS — E6609 Other obesity due to excess calories: Secondary | ICD-10-CM | POA: Diagnosis not present

## 2018-08-31 DIAGNOSIS — R739 Hyperglycemia, unspecified: Secondary | ICD-10-CM

## 2018-08-31 DIAGNOSIS — I10 Essential (primary) hypertension: Secondary | ICD-10-CM | POA: Diagnosis not present

## 2018-08-31 LAB — LIPID PANEL
Cholesterol: 181 mg/dL (ref 0–200)
HDL: 53.4 mg/dL (ref >39.00)
LDL CALC: 100 mg/dL — AB (ref 0–99)
NONHDL: 127.23
Total CHOL/HDL Ratio: 3
Triglycerides: 135 mg/dL (ref 0.0–149.0)
VLDL: 27 mg/dL (ref 0.0–40.0)

## 2018-08-31 LAB — COMPREHENSIVE METABOLIC PANEL
ALK PHOS: 91 U/L (ref 39–117)
ALT: 66 U/L — ABNORMAL HIGH (ref 0–35)
AST: 16 U/L (ref 0–37)
Albumin: 4.1 g/dL (ref 3.5–5.2)
BILIRUBIN TOTAL: 0.5 mg/dL (ref 0.2–1.2)
BUN: 17 mg/dL (ref 6–23)
CHLORIDE: 102 meq/L (ref 96–112)
CO2: 35 meq/L — AB (ref 19–32)
Calcium: 9.4 mg/dL (ref 8.4–10.5)
Creatinine, Ser: 1.03 mg/dL (ref 0.40–1.20)
GFR: 54.17 mL/min — ABNORMAL LOW (ref >60.00)
GLUCOSE: 81 mg/dL (ref 70–99)
POTASSIUM: 4.4 meq/L (ref 3.5–5.1)
SODIUM: 142 meq/L (ref 135–145)
Total Protein: 6.4 g/dL (ref 6.0–8.3)

## 2018-08-31 LAB — CBC
HCT: 42.3 % (ref 36.0–46.0)
Hemoglobin: 14.5 g/dL (ref 12.0–15.0)
MCHC: 34.2 g/dL (ref 30.0–36.0)
MCV: 89 fl (ref 78.0–100.0)
Platelets: 198 10*3/uL (ref 150.0–400.0)
RBC: 4.76 Mil/uL (ref 3.87–5.11)
RDW: 13.3 % (ref 11.5–15.5)
WBC: 5 10*3/uL (ref 4.0–10.5)

## 2018-08-31 LAB — TSH: TSH: 2.05 u[IU]/mL (ref 0.35–4.50)

## 2018-08-31 LAB — HEMOGLOBIN A1C: Hgb A1c MFr Bld: 5.2 % (ref 4.6–6.5)

## 2018-09-07 ENCOUNTER — Ambulatory Visit: Payer: Managed Care, Other (non HMO) | Admitting: Family Medicine

## 2018-09-07 ENCOUNTER — Other Ambulatory Visit: Payer: Self-pay

## 2018-09-07 ENCOUNTER — Ambulatory Visit (INDEPENDENT_AMBULATORY_CARE_PROVIDER_SITE_OTHER): Payer: Managed Care, Other (non HMO) | Admitting: Family Medicine

## 2018-09-07 VITALS — BP 102/62 | HR 80 | Temp 98.7°F | Resp 18 | Wt 210.6 lb

## 2018-09-07 DIAGNOSIS — R748 Abnormal levels of other serum enzymes: Secondary | ICD-10-CM

## 2018-09-07 DIAGNOSIS — I10 Essential (primary) hypertension: Secondary | ICD-10-CM

## 2018-09-07 DIAGNOSIS — R739 Hyperglycemia, unspecified: Secondary | ICD-10-CM

## 2018-09-07 DIAGNOSIS — E782 Mixed hyperlipidemia: Secondary | ICD-10-CM

## 2018-09-07 DIAGNOSIS — Z780 Asymptomatic menopausal state: Secondary | ICD-10-CM

## 2018-09-07 DIAGNOSIS — Z6841 Body Mass Index (BMI) 40.0 and over, adult: Secondary | ICD-10-CM

## 2018-09-07 DIAGNOSIS — M25562 Pain in left knee: Secondary | ICD-10-CM

## 2018-09-07 DIAGNOSIS — Z23 Encounter for immunization: Secondary | ICD-10-CM

## 2018-09-07 DIAGNOSIS — F418 Other specified anxiety disorders: Secondary | ICD-10-CM

## 2018-09-07 DIAGNOSIS — M791 Myalgia, unspecified site: Secondary | ICD-10-CM | POA: Diagnosis not present

## 2018-09-07 DIAGNOSIS — K219 Gastro-esophageal reflux disease without esophagitis: Secondary | ICD-10-CM

## 2018-09-07 DIAGNOSIS — N6452 Nipple discharge: Secondary | ICD-10-CM | POA: Diagnosis not present

## 2018-09-07 DIAGNOSIS — R519 Headache, unspecified: Secondary | ICD-10-CM

## 2018-09-07 DIAGNOSIS — H00011 Hordeolum externum right upper eyelid: Secondary | ICD-10-CM

## 2018-09-07 DIAGNOSIS — H269 Unspecified cataract: Secondary | ICD-10-CM | POA: Insufficient documentation

## 2018-09-07 DIAGNOSIS — R51 Headache: Secondary | ICD-10-CM

## 2018-09-07 MED ORDER — GABAPENTIN 300 MG PO CAPS: 300.0000 mg | ORAL_CAPSULE | Freq: Two times a day (BID) | ORAL | 3 refills | Status: DC

## 2018-09-07 MED ORDER — IRBESARTAN-HYDROCHLOROTHIAZIDE 300-12.5 MG PO TABS: 1.0000 | ORAL_TABLET | Freq: Every day | ORAL | 1 refills | Status: DC

## 2018-09-07 MED ORDER — ESTRADIOL 0.025 MG/24HR TD PTWK: 0.0250 mg | MEDICATED_PATCH | TRANSDERMAL | 2 refills | Status: DC

## 2018-09-07 MED ORDER — VENLAFAXINE HCL ER 75 MG PO CP24: ORAL_CAPSULE | ORAL | 3 refills | Status: DC

## 2018-09-07 MED ORDER — PANTOPRAZOLE SODIUM 40 MG PO TBEC: 40.0000 mg | DELAYED_RELEASE_TABLET | Freq: Every day | ORAL | 1 refills | Status: DC

## 2018-09-07 NOTE — Progress Notes (Signed)
Subjective:    [name], female    DOB: 09-18-1955, 63 y.o.   MRN: [id number]  No chief complaint on file.   HPI Patient is in today for follow up. She feels well today but she does note trouble with breast discharge which is bilateral is never bloody and has been happening for years. No masses, skin changes or pain. She struggles with chronic fatigue and myalgias. No recent febrile illness or hospitalizations. Denies CP/palp/SOB/HA/congestion/fevers/GI or GU c/o. Taking meds as prescribed  Past Medical History:  Diagnosis Date  . Adenomatous colon polyp   . Allergic rhinitis   . Allergy    late fall, spring   . Anemia    as teenager   . Breast abscess 09/22/2014   recurrent  . Colon polyps   . Depression    pt denies- uses venlafaxine with menopause   . Facial lesion 06/2010   removed from right side of face--precancerous  . GERD (gastroesophageal reflux disease)   . Hypertension   . Hypoglycemia   . Hypoglycemia   . Osteoarthritis    knees neck and hands- OA  . Pain of left heel 02/17/2017  . Preventative health care 03/07/2014  . Snoring 2008   negative sleep study of OSA  . Tendonitis 08/16/2016  . Visual disturbance    visual flashes, referred to [facility] (Normal funduscopic exam)    Past Surgical History:  Procedure Laterality Date  . ABDOMINAL HYSTERECTOMY  2002  . CARPAL TUNNEL RELEASE  05/2010, 06/2010   bilateral--Dr Tamera Punt  . CERVICAL FUSION  06/15/09   right sided C6-C7 radiculopathy status post anterior cervical diskectomy and fusion at the C5-6 and C6-7 levels. Phylliss Bob, MD  . CHOLECYSTECTOMY N/A 10/10/2016   Procedure: LAPAROSCOPIC CHOLECYSTECTOMY;  Surgeon: Autumn Messing III, MD;  Location: [facility];  Service: General;  Laterality: N/A;  . COLONOSCOPY    . DORSAL COMPARTMENT RELEASE Left 02/06/2015   Procedure: DEQUERVAIN'S RELEASE;  Surgeon: Milly Jakob, MD;  Location: [place];  Service: Orthopedics;   Laterality: Left;  . FRACTURE SURGERY  08/12/2014 & 02/06/2015   Original break in Feb & add'l surg in Aug  Plate in Left arm  . HARDWARE REMOVAL Left 02/06/2015   Procedure: LEFT WRIST REMOVAL OF HARDWARE;  Surgeon: Milly Jakob, MD;  Location: [facility];  Service: Orthopedics;  Laterality: Left;  . Left wrist surgery     Feb.19th 2016  . NASAL SEPTUM SURGERY    . SPINE SURGERY  5 +/- yrs ago   Neck vertebrae fusion  . Surgery on left ankle for fracture      Family History  Problem Relation Age of Onset  . Stroke Mother   . Colon cancer Mother   . Hypertension Mother   . Hyperlipidemia Mother   . Arthritis Mother        OA and rheumatoid  . Cancer Mother        colon  . Heart disease Mother        MI 21  . Colon polyps Mother   . Alcohol abuse Father   . Heart disease Father   . Hypertension Father   . Colon polyps Father   . Arthritis Maternal Grandmother        RA and OA  . Diabetes Maternal Grandmother   . Heart disease Maternal Grandmother   . Hypertension Maternal Grandmother   . Stroke Maternal Grandmother   . Heart disease  Maternal Grandfather   . Hypertension Maternal Grandfather   . Diabetes Paternal Grandfather   . Esophageal cancer Neg Hx   . Rectal cancer Neg Hx   . Stomach cancer Neg Hx     Social History   Socioeconomic History  . Marital status: Married    Spouse name: Not on file  . Number of children: Not on file  . Years of education: Not on file  . Highest education level: Not on file  Occupational History  . Occupation: Primary school teacher for [employer]  . Financial resource strain: Not on file  . Food insecurity:    Worry: Not on file    Inability: Not on file  . Transportation needs:    Medical: Not on file    Non-medical: Not on file  Tobacco Use  . Smoking status: Never Smoker  . Smokeless tobacco: Never Used  Substance and Sexual Activity  . Alcohol use: No  . Drug use: No  .  Sexual activity: Yes    Comment: lives with husband, no dietary restrictions  Lifestyle  . Physical activity:    Days per week: Not on file    Minutes per session: Not on file  . Stress: Not on file  Relationships  . Social connections:    Talks on phone: Not on file    Gets together: Not on file    Attends religious service: Not on file    Active member of club or organization: Not on file    Attends meetings of clubs or organizations: Not on file    Relationship status: Not on file  . Intimate partner violence:    Fear of current or ex partner: Not on file    Emotionally abused: Not on file    Physically abused: Not on file    Forced sexual activity: Not on file  Other Topics Concern  . Not on file  Social History Narrative  . Not on file    Outpatient Medications Prior to Visit  Medication Sig Dispense Refill  . aspirin 81 MG EC tablet Take 81 mg by mouth daily.      . fexofenadine (ALLEGRA) 180 MG tablet Take 180 mg by mouth at bedtime as needed for allergies.     Javier Docker Oil 350 MG CAPS Take 350 mg by mouth daily.    . Misc Natural Products (GLUCOSAMINE CHONDROITIN TRIPLE) TABS Take 1 tablet by mouth 2 (two) times daily.    . Soy Isoflavones 40 MG TABS Take 40 mg by mouth 2 (two) times daily.     Marland Kitchen estradiol (CLIMARA - DOSED IN MG/24 HR) 0.025 mg/24hr patch Place 1 patch (0.025 mg total) onto the skin once a week. (Patient taking differently: Place 0.025 mg onto the skin every Monday. ) 12 patch 2  . gabapentin (NEURONTIN) 300 MG capsule TAKE 1 CAPSULE BY MOUTH TWICE DAILY 180 capsule 3  . irbesartan-hydrochlorothiazide (AVALIDE) 300-12.5 MG tablet TAKE 1 TABLET BY MOUTH DAILY 90 tablet 1  . pantoprazole (PROTONIX) 40 MG tablet Take 1 tablet (40 mg total) by mouth daily. 90 tablet 1  . venlafaxine XR (EFFEXOR-XR) 75 MG 24 hr capsule TAKE 1 CAPSULE BY MOUTH DAILY WITH BREAKFAST 90 capsule 3   No facility-administered medications prior to visit.     No Known Allergies   Review of Systems  Constitutional: Positive for malaise/fatigue. Negative for fever.  HENT: Negative for congestion.   Eyes: Negative for blurred vision.  Respiratory: Negative  for shortness of breath.   Cardiovascular: Negative for chest pain, palpitations and leg swelling.  Gastrointestinal: Negative for abdominal pain, blood in stool and nausea.  Genitourinary: Negative for dysuria and frequency.  Musculoskeletal: Positive for myalgias. Negative for falls.  Skin: Negative for rash.  Neurological: Negative for dizziness, loss of consciousness and headaches.  Endo/Heme/Allergies: Negative for environmental allergies.  Psychiatric/Behavioral: Negative for depression. The patient is not nervous/anxious.        Objective:    Physical Exam Constitutional:      General: She is not in acute distress.    Appearance: She is not diaphoretic.  HENT:     Head: Normocephalic and atraumatic.     Right Ear: External ear normal.     Left Ear: External ear normal.     Nose: Nose normal.     Mouth/Throat:     Pharynx: No oropharyngeal exudate.  Eyes:     General: No scleral icterus.       Right eye: No discharge.        Left eye: No discharge.     Conjunctiva/sclera: Conjunctivae normal.     Pupils: Pupils are equal, round, and reactive to light.  Neck:     Musculoskeletal: Normal range of motion and neck supple.     Thyroid: No thyromegaly.  Cardiovascular:     Rate and Rhythm: Normal rate and regular rhythm.     Heart sounds: Normal heart sounds. No murmur.  Pulmonary:     Effort: Pulmonary effort is normal. No respiratory distress.     Breath sounds: Normal breath sounds. No wheezing or rales.  Abdominal:     General: Bowel sounds are normal. There is no distension.     Palpations: Abdomen is soft. There is no mass.     Tenderness: There is no abdominal tenderness.  Musculoskeletal: Normal range of motion.        General: No tenderness.  Lymphadenopathy:     Cervical: No  cervical adenopathy.  Skin:    General: Skin is warm and dry.     Findings: No rash.  Neurological:     Mental Status: She is alert and oriented to person, place, and time.     Cranial Nerves: No cranial nerve deficit.     Coordination: Coordination normal.     Deep Tendon Reflexes: Reflexes are normal and symmetric. Reflexes normal.     BP 102/62 (BP Location: Left Arm, Patient Position: Sitting, Cuff Size: Normal)   Pulse 80   Temp 98.7 F (37.1 C) (Oral)   Resp 18   Wt 210 lb 9.6 oz (95.5 kg)   SpO2 97%   BMI 36.15 kg/m  Wt Readings from Last 3 Encounters:  09/07/18 210 lb 9.6 oz (95.5 kg)  03/09/18 210 lb 12.8 oz (95.6 kg)  01/08/18 215 lb 6.4 oz (97.7 kg)     Lab Results  Component Value Date   WBC 5.0 08/31/2018   HGB 14.5 08/31/2018   HCT 42.3 08/31/2018   PLT 198.0 08/31/2018   GLUCOSE 81 08/31/2018   CHOL 181 08/31/2018   TRIG 135.0 08/31/2018   HDL 53.40 08/31/2018   LDLDIRECT 125.0 08/25/2017   LDLCALC 100 (H) 08/31/2018   ALT 66 (H) 08/31/2018   AST 16 08/31/2018   NA 142 08/31/2018   K 4.4 08/31/2018   CL 102 08/31/2018   CREATININE 1.03 08/31/2018   BUN 17 08/31/2018   CO2 35 (H) 08/31/2018   TSH 2.05 08/31/2018  INR 0.97 06/13/2009   HGBA1C 5.2 08/31/2018    Lab Results  Component Value Date   TSH 2.05 08/31/2018   Lab Results  Component Value Date   WBC 5.0 08/31/2018   HGB 14.5 08/31/2018   HCT 42.3 08/31/2018   MCV 89.0 08/31/2018   PLT 198.0 08/31/2018   Lab Results  Component Value Date   NA 142 08/31/2018   K 4.4 08/31/2018   CO2 35 (H) 08/31/2018   GLUCOSE 81 08/31/2018   BUN 17 08/31/2018   CREATININE 1.03 08/31/2018   BILITOT 0.5 08/31/2018   ALKPHOS 91 08/31/2018   AST 16 08/31/2018   ALT 66 (H) 08/31/2018   PROT 6.4 08/31/2018   ALBUMIN 4.1 08/31/2018   CALCIUM 9.4 08/31/2018   ANIONGAP 9 10/09/2016   GFR 54.17 (L) 08/31/2018   Lab Results  Component Value Date   CHOL 181 08/31/2018   Lab Results   Component Value Date   HDL 53.40 08/31/2018   Lab Results  Component Value Date   LDLCALC 100 (H) 08/31/2018   Lab Results  Component Value Date   TRIG 135.0 08/31/2018   Lab Results  Component Value Date   CHOLHDL 3 08/31/2018   Lab Results  Component Value Date   HGBA1C 5.2 08/31/2018       Assessment & Plan:   Problem List Items Addressed This Visit    Essential hypertension    Encouraged increased hydration, 64 ounces of clear fluids daily. Minimize alcohol and caffeine. Eat small frequent meals with lean proteins and complex carbs. Avoid high and low blood sugars. Get adequate sleep, 7-8 hours a night. Needs exercise daily preferably in the morning.      Relevant Medications   gabapentin (NEURONTIN) 300 MG capsule   irbesartan-hydrochlorothiazide (AVALIDE) 300-12.5 MG tablet   venlafaxine XR (EFFEXOR-XR) 75 MG 24 hr capsule   Other Relevant Orders   CBC   Comprehensive metabolic panel   TSH   GERD (gastroesophageal reflux disease)   Relevant Medications   pantoprazole (PROTONIX) 40 MG tablet   venlafaxine XR (EFFEXOR-XR) 75 MG 24 hr capsule   Hyperlipidemia, mixed    Encouraged heart healthy diet, increase exercise, avoid trans fats, consider a krill oil cap daily      Relevant Medications   irbesartan-hydrochlorothiazide (AVALIDE) 300-12.5 MG tablet   Other Relevant Orders   Lipid panel   Post-menopause   Relevant Medications   gabapentin (NEURONTIN) 300 MG capsule   venlafaxine XR (EFFEXOR-XR) 75 MG 24 hr capsule   Knee pain, acute   Hordeolum    Was treated with Doxycycline by her dermatologist Dr Hassell Done in [place]. He also tried to open it with lancing. If it worsens she will return there.       Hyperglycemia    hgba1c acceptable, minimize simple carbs. Increase exercise as tolerated      Relevant Orders   Hemoglobin A1c   Elevated liver enzymes    Mild, likely fatty liver encouraged to avoid simple carbs and fatty foods.       Cataract    Has  been following with opthamology. Her vision is worsening she will follow up with htem      Headache    Some headaches start at occiput and generalize others start behind eyes or with bright light or allergy symptoms. So multifactorial Encouraged increased hydration, 64 ounces of clear fluids daily. Minimize alcohol and caffeine. Eat small frequent meals with lean proteins and complex carbs. Avoid high and  low blood sugars. Get adequate sleep, 7-8 hours a night. Needs exercise daily preferably in the morning.      Relevant Medications   gabapentin (NEURONTIN) 300 MG capsule   venlafaxine XR (EFFEXOR-XR) 75 MG 24 hr capsule   Discharge from breast    Bilateral L>R for more than one year. MGM in October normal. Check Prolactin level and monitor. Patient will report if worsens or changes.        Other Visit Diagnoses    Nipple discharge in female    -  Primary   Relevant Orders   Prolactin   Need for prophylactic vaccination and inoculation against influenza       Relevant Medications   gabapentin (NEURONTIN) 300 MG capsule   venlafaxine XR (EFFEXOR-XR) 75 MG 24 hr capsule   Myalgia       Relevant Medications   gabapentin (NEURONTIN) 300 MG capsule   Depression with anxiety       Relevant Medications   venlafaxine XR (EFFEXOR-XR) 75 MG 24 hr capsule   BMI 60.0-69.9, adult ([place])          I have changed Ranae Palms. Laine "Cindy"'s gabapentin. I am also having her maintain her fexofenadine, Soy Isoflavones, aspirin, Krill Oil, Glucosamine Chondroitin Triple, irbesartan-hydrochlorothiazide, pantoprazole, venlafaxine XR, and estradiol.  Meds ordered this encounter  Medications  . gabapentin (NEURONTIN) 300 MG capsule    Sig: Take 1 capsule (300 mg total) by mouth 2 (two) times daily.    Dispense:  180 capsule    Refill:  3  . irbesartan-hydrochlorothiazide (AVALIDE) 300-12.5 MG tablet    Sig: Take 1 tablet by mouth daily.    Dispense:  90 tablet    Refill:  1  . pantoprazole  (PROTONIX) 40 MG tablet    Sig: Take 1 tablet (40 mg total) by mouth daily.    Dispense:  90 tablet    Refill:  1  . venlafaxine XR (EFFEXOR-XR) 75 MG 24 hr capsule    Sig: TAKE 1 CAPSULE BY MOUTH DAILY WITH BREAKFAST    Dispense:  90 capsule    Refill:  3  . estradiol (CLIMARA - DOSED IN MG/24 HR) 0.025 mg/24hr patch    Sig: Place 1 patch (0.025 mg total) onto the skin once a week.    Dispense:  12 patch    Refill:  2     Penni Homans, MD

## 2018-09-07 NOTE — Assessment & Plan Note (Signed)
Encouraged increased hydration, 64 ounces of clear fluids daily. Minimize alcohol and caffeine. Eat small frequent meals with lean proteins and complex carbs. Avoid high and low blood sugars. Get adequate sleep, 7-8 hours a night. Needs exercise daily preferably in the morning.  

## 2018-09-07 NOTE — Assessment & Plan Note (Signed)
hgba1c acceptable, minimize simple carbs. Increase exercise as tolerated.  

## 2018-09-07 NOTE — Assessment & Plan Note (Signed)
Mild, likely fatty liver encouraged to avoid simple carbs and fatty foods.

## 2018-09-07 NOTE — Assessment & Plan Note (Signed)
Has been following with opthamology. Her vision is worsening she will follow up with htem

## 2018-09-07 NOTE — Assessment & Plan Note (Signed)
Encouraged heart healthy diet, increase exercise, avoid trans fats, consider a krill oil cap daily 

## 2018-09-07 NOTE — Assessment & Plan Note (Signed)
Some headaches start at occiput and generalize others start behind eyes or with bright light or allergy symptoms. So multifactorial Encouraged increased hydration, 64 ounces of clear fluids daily. Minimize alcohol and caffeine. Eat small frequent meals with lean proteins and complex carbs. Avoid high and low blood sugars. Get adequate sleep, 7-8 hours a night. Needs exercise daily preferably in the morning.

## 2018-09-07 NOTE — Assessment & Plan Note (Signed)
Bilateral L>R for more than one year. MGM in October normal. Check Prolactin level and monitor. Patient will report if worsens or changes.

## 2018-09-07 NOTE — Patient Instructions (Addendum)
Fatty Liver Disease  Fatty liver disease occurs when too much fat has built up in your liver cells. Fatty liver disease is also called hepatic steatosis or steatohepatitis. The liver removes harmful substances from your bloodstream and produces fluids that your body needs. It also helps your body use and store energy from the food you eat. In many cases, fatty liver disease does not cause symptoms or problems. It is often diagnosed when tests are being done for other reasons. However, over time, fatty liver can cause inflammation that may lead to more serious liver problems, such as scarring of the liver (cirrhosis) and liver failure. Fatty liver is associated with insulin resistance, increased body fat, high blood pressure (hypertension), and high cholesterol. These are features of metabolic syndrome and increase your risk for stroke, diabetes, and heart disease. What are the causes? This condition may be caused by:  Drinking too much alcohol.  Poor nutrition.  Obesity.  Cushing's syndrome.  Diabetes.  High cholesterol.  Certain drugs.  Poisons.  Some viral infections.  Pregnancy. What increases the risk? You are more likely to develop this condition if you:  Abuse alcohol.  Are overweight.  Have diabetes.  Have hepatitis.  Have a high triglyceride level.  Are pregnant. What are the signs or symptoms? Fatty liver disease often does not cause symptoms. If symptoms do develop, they can include:  Fatigue.  Weakness.  Weight loss.  Confusion.  Abdominal pain.  Nausea and vomiting.  Yellowing of your skin and the white parts of your eyes (jaundice).  Itchy skin. How is this diagnosed? This condition may be diagnosed by:  A physical exam and medical history.  Blood tests.  Imaging tests, such as an ultrasound, CT scan, or MRI.  A liver biopsy. A small sample of liver tissue is removed using a needle. The sample is then looked at under a microscope. How  is this treated? Fatty liver disease is often caused by other health conditions. Treatment for fatty liver may involve medicines and lifestyle changes to manage conditions such as:  Alcoholism.  High cholesterol.  Diabetes.  Being overweight or obese. Follow these instructions at home:   Do not drink alcohol. If you have trouble quitting, ask your health care provider how to safely quit with the help of medicine or a supervised program. This is important to keep your condition from getting worse.  Eat a healthy diet as told by your health care provider. Ask your health care provider about working with a diet and nutrition specialist (dietitian) to develop an eating plan.  Exercise regularly. This can help you lose weight and control your cholesterol and diabetes. Talk to your health care provider about an exercise plan and which activities are best for you.  Take over-the-counter and prescription medicines only as told by your health care provider.  Keep all follow-up visits as told by your health care provider. This is important. Contact a health care provider if: You have trouble controlling your:  Blood sugar. This is especially important if you have diabetes.  Cholesterol.  Drinking of alcohol. Get help right away if:  You have abdominal pain.  You have jaundice.  You have nausea and vomiting.  You vomit blood or material that looks like coffee grounds.  You have stools that are black, tar-like, or bloody. Summary  Fatty liver disease develops when too much fat builds up in the cells of your liver.  Fatty liver disease often causes no symptoms or problems. However, over   time, fatty liver can cause inflammation that may lead to more serious liver problems, such as scarring of the liver (cirrhosis).  You are more likely to develop this condition if you abuse alcohol, are pregnant, are overweight, have diabetes, have hepatitis, or have high triglyceride levels.   Contact your health care provider if you have trouble controlling your weight, blood sugar, cholesterol, or drinking of alcohol. This information is not intended to replace advice given to you by your health care provider. Make sure you discuss any questions you have with your health care provider. Document Released: 07/26/2005 Document Revised: 03/19/2017 Document Reviewed: 03/19/2017 Elsevier Interactive Patient Education  2019 [employer]  Galactorrhea Galactorrhea is an abnormal milky discharge from the breast. The discharge may come from one or both nipples. The fluid is often white, yellow, or green. It is different from the normal milk produced in nursing mothers. Galactorrhea usually occurs in women, but it can sometimes affect men. Various things can cause galactorrhea, such as:  Irritation of the breast, which can result from injury, stimulation during sexual activity, or clothes rubbing against the nipple.  Medicines.  Changes in hormone levels. In many cases, galactorrhea will go away without treatment. However, galactorrhea can also be a sign of something more serious, such as diseases of the kidney or thyroid, or problems with the pituitary gland. Your health care provider may do various tests to help determine the cause. Sometimes the cause is unknown. It is important to monitor your condition to make sure that it goes away. Follow these instructions at home:  Breast care  Watch your condition for any changes.  Do not squeeze your breasts or nipples.  Avoid breast stimulation during sexual activity.  Perform a breast self-exam once a month. Doing this more often can irritate your breasts.  Avoid clothes that rub on your nipples.  Use breast pads to absorb the discharge.  Wear a breast binder or a support bra to help prevent clothes from rubbing on your nipples. General instructions  Take over-the-counter and prescription medicines only as told by your health care  provider.  Keep all follow-up visits as told by your health care provider. This is important. Contact a health care provider if you:  Develop hot flashes, vaginal dryness, or a lack of sexual desire.  Stop having menstrual periods, or they are irregular or far apart.  Have headaches.  Have vision problems. Get help right away if you:  Have breast discharge that is bloody or pus-like.  Have breast pain.  Feel a lump in your breast.  Have wrinkling or dimpling on your breast.  Notice that your breast becomes red and swollen. Summary  Galactorrhea is an abnormal milky discharge from the breast. The fluid may come from one or both nipples and is often white, yellow, or green.  Galactorrhea may be caused by various things, such as irritation of the nipples, medicines, or changes in hormone levels.  Galactorrhea often goes away without treatment. However, it also may be a sign of something more serious, such as diseases of the kidney or thyroid, or problems with the pituitary gland.  Get help right away if you have discharge that is bloody or pus-like, if you have breast pain or a lump, or if you have skin changes on your breast. This information is not intended to replace advice given to you by your health care provider. Make sure you discuss any questions you have with your health care provider. Document Released: 07/18/2004 Document  Revised: 05/07/2017 Document Reviewed: 05/07/2017 Elsevier Interactive Patient Education  [employer].

## 2018-09-07 NOTE — Assessment & Plan Note (Signed)
Was treated with Doxycycline by her dermatologist Dr Hassell Done in [place]. He also tried to open it with lancing. If it worsens she will return there.

## 2018-09-08 LAB — PROLACTIN: Prolactin: 14.3 ng/mL

## 2019-02-19 ENCOUNTER — Other Ambulatory Visit: Payer: Self-pay | Admitting: Family Medicine

## 2019-03-15 ENCOUNTER — Other Ambulatory Visit: Payer: Self-pay

## 2019-03-15 ENCOUNTER — Other Ambulatory Visit (INDEPENDENT_AMBULATORY_CARE_PROVIDER_SITE_OTHER): Payer: Managed Care, Other (non HMO)

## 2019-03-15 DIAGNOSIS — I10 Essential (primary) hypertension: Secondary | ICD-10-CM | POA: Diagnosis not present

## 2019-03-15 DIAGNOSIS — E782 Mixed hyperlipidemia: Secondary | ICD-10-CM | POA: Diagnosis not present

## 2019-03-15 DIAGNOSIS — R739 Hyperglycemia, unspecified: Secondary | ICD-10-CM

## 2019-03-15 LAB — CBC
HCT: 41 % (ref 36.0–46.0)
Hemoglobin: 14 g/dL (ref 12.0–15.0)
MCHC: 34.1 g/dL (ref 30.0–36.0)
MCV: 89.3 fl (ref 78.0–100.0)
Platelets: 182 10*3/uL (ref 150.0–400.0)
RBC: 4.59 Mil/uL (ref 3.87–5.11)
RDW: 12.4 % (ref 11.5–15.5)
WBC: 4 10*3/uL (ref 4.0–10.5)

## 2019-03-15 LAB — COMPREHENSIVE METABOLIC PANEL
ALT: 14 U/L (ref 0–35)
AST: 15 U/L (ref 0–37)
Albumin: 3.9 g/dL (ref 3.5–5.2)
Alkaline Phosphatase: 84 U/L (ref 39–117)
BUN: 16 mg/dL (ref 6–23)
CO2: 32 mEq/L (ref 19–32)
Calcium: 9.3 mg/dL (ref 8.4–10.5)
Chloride: 104 mEq/L (ref 96–112)
Creatinine, Ser: 0.97 mg/dL (ref 0.40–1.20)
GFR: 57.96 mL/min — ABNORMAL LOW (ref >60.00)
Glucose, Bld: 97 mg/dL (ref 70–99)
Potassium: 4 mEq/L (ref 3.5–5.1)
Sodium: 143 mEq/L (ref 135–145)
Total Bilirubin: 0.4 mg/dL (ref 0.2–1.2)
Total Protein: 6.2 g/dL (ref 6.0–8.3)

## 2019-03-15 LAB — LIPID PANEL
Cholesterol: 178 mg/dL (ref 0–200)
HDL: 37 mg/dL — ABNORMAL LOW (ref >39.00)
NonHDL: 141.01
Total CHOL/HDL Ratio: 5
Triglycerides: 203 mg/dL — ABNORMAL HIGH (ref 0.0–149.0)
VLDL: 40.6 mg/dL — ABNORMAL HIGH (ref 0.0–40.0)

## 2019-03-15 LAB — HEMOGLOBIN A1C: Hgb A1c MFr Bld: 5.2 % (ref 4.6–6.5)

## 2019-03-15 LAB — TSH: TSH: 1.61 u[IU]/mL (ref 0.35–4.50)

## 2019-03-15 LAB — LDL CHOLESTEROL, DIRECT: Direct LDL: 115 mg/dL

## 2019-03-22 ENCOUNTER — Other Ambulatory Visit: Payer: Self-pay | Admitting: Family Medicine

## 2019-03-22 ENCOUNTER — Ambulatory Visit: Payer: Managed Care, Other (non HMO) | Admitting: Family Medicine

## 2019-04-19 ENCOUNTER — Other Ambulatory Visit: Payer: Self-pay

## 2019-04-20 ENCOUNTER — Ambulatory Visit (INDEPENDENT_AMBULATORY_CARE_PROVIDER_SITE_OTHER): Payer: Managed Care, Other (non HMO) | Admitting: Family Medicine

## 2019-04-20 ENCOUNTER — Other Ambulatory Visit: Payer: Self-pay

## 2019-04-20 VITALS — BP 102/58 | HR 71 | Temp 97.9°F | Resp 18 | Ht 64.0 in | Wt 210.0 lb

## 2019-04-20 DIAGNOSIS — I10 Essential (primary) hypertension: Secondary | ICD-10-CM | POA: Diagnosis not present

## 2019-04-20 DIAGNOSIS — Z0000 Encounter for general adult medical examination without abnormal findings: Secondary | ICD-10-CM

## 2019-04-20 DIAGNOSIS — H00011 Hordeolum externum right upper eyelid: Secondary | ICD-10-CM

## 2019-04-20 DIAGNOSIS — E6609 Other obesity due to excess calories: Secondary | ICD-10-CM

## 2019-04-20 DIAGNOSIS — R739 Hyperglycemia, unspecified: Secondary | ICD-10-CM | POA: Diagnosis not present

## 2019-04-20 DIAGNOSIS — E782 Mixed hyperlipidemia: Secondary | ICD-10-CM

## 2019-04-20 MED ORDER — POLYMYXIN B-TRIMETHOPRIM 10000-0.1 UNIT/ML-% OP SOLN: 1.0000 [drp] | Freq: Four times a day (QID) | OPHTHALMIC | 0 refills | Status: DC

## 2019-04-20 NOTE — Assessment & Plan Note (Signed)
Encouraged heart healthy diet, increase exercise, avoid trans fats and simple carbs.  

## 2019-04-20 NOTE — Patient Instructions (Addendum)
Omron blood pressure cuff, upper arm   Pulse oximeter want oxygen in 90s   Weekly vitals  NOOM or [employer] APP  For the Triglycerides minimize simple carbohydrates and increase activity where you can  Benefiber can go in coffee   Preventive Care 4-64 Years Old, Female Preventive care refers to visits with your health care provider and lifestyle choices that can promote health and wellness. This includes:  A yearly physical exam. This may also be called an annual well check.  Regular dental visits and eye exams.  Immunizations.  Screening for certain conditions.  Healthy lifestyle choices, such as eating a healthy diet, getting regular exercise, not using drugs or products that contain nicotine and tobacco, and limiting alcohol use. What can I expect for my preventive care visit? Physical exam Your health care provider will check your:  Height and weight. This may be used to calculate body mass index (BMI), which tells if you are at a healthy weight.  Heart rate and blood pressure.  Skin for abnormal spots. Counseling Your health care provider may ask you questions about your:  Alcohol, tobacco, and drug use.  Emotional well-being.  Home and relationship well-being.  Sexual activity.  Eating habits.  Work and work Statistician.  Method of birth control.  Menstrual cycle.  Pregnancy history. What immunizations do I need?  Influenza (flu) vaccine  This is recommended every year. Tetanus, diphtheria, and pertussis (Tdap) vaccine  You may need a Td booster every 10 years. Varicella (chickenpox) vaccine  You may need this if you have not been vaccinated. Zoster (shingles) vaccine  You may need this after age 47. Measles, mumps, and rubella (MMR) vaccine  You may need at least one dose of MMR if you were born in 1957 or later. You may also need a second dose. Pneumococcal conjugate (PCV13) vaccine  You may need this if you have certain  conditions and were not previously vaccinated. Pneumococcal polysaccharide (PPSV23) vaccine  You may need one or two doses if you smoke cigarettes or if you have certain conditions. Meningococcal conjugate (MenACWY) vaccine  You may need this if you have certain conditions. Hepatitis A vaccine  You may need this if you have certain conditions or if you travel or work in places where you may be exposed to hepatitis A. Hepatitis B vaccine  You may need this if you have certain conditions or if you travel or work in places where you may be exposed to hepatitis B. Haemophilus influenzae type b (Hib) vaccine  You may need this if you have certain conditions. Human papillomavirus (HPV) vaccine  If recommended by your health care provider, you may need three doses over 6 months. You may receive vaccines as individual doses or as more than one vaccine together in one shot (combination vaccines). Talk with your health care provider about the risks and benefits of combination vaccines. What tests do I need? Blood tests  Lipid and cholesterol levels. These may be checked every 5 years, or more frequently if you are over 54 years old.  Hepatitis C test.  Hepatitis B test. Screening  Lung cancer screening. You may have this screening every year starting at age 71 if you have a 30-pack-year history of smoking and currently smoke or have quit within the past 15 years.  Colorectal cancer screening. All adults should have this screening starting at age 65 and continuing until age 14. Your health care provider may recommend screening at age 28 if you are  at increased risk. You will have tests every 1-10 years, depending on your results and the type of screening test.  Diabetes screening. This is done by checking your blood sugar (glucose) after you have not eaten for a while (fasting). You may have this done every 1-3 years.  Mammogram. This may be done every 1-2 years. Talk with your health care  provider about when you should start having regular mammograms. This may depend on whether you have a family history of breast cancer.  BRCA-related cancer screening. This may be done if you have a family history of breast, ovarian, tubal, or peritoneal cancers.  Pelvic exam and Pap test. This may be done every 3 years starting at age 23. Starting at age 28, this may be done every 5 years if you have a Pap test in combination with an HPV test. Other tests  Sexually transmitted disease (STD) testing.  Bone density scan. This is done to screen for osteoporosis. You may have this scan if you are at high risk for osteoporosis. Follow these instructions at home: Eating and drinking  Eat a diet that includes fresh fruits and vegetables, whole grains, lean protein, and low-fat dairy.  Take vitamin and mineral supplements as recommended by your health care provider.  Do not drink alcohol if: ? Your health care provider tells you not to drink. ? You are pregnant, may be pregnant, or are planning to become pregnant.  If you drink alcohol: ? Limit how much you have to 0-1 drink a day. ? Be aware of how much alcohol is in your drink. In the [place] one drink equals one 12 oz bottle of beer (355 mL), one 5 oz glass of wine (148 mL), or one 1 oz glass of hard liquor (44 mL). Lifestyle  Take daily care of your teeth and gums.  Stay active. Exercise for at least 30 minutes on 5 or more days each week.  Do not use any products that contain nicotine or tobacco, such as cigarettes, e-cigarettes, and chewing tobacco. If you need help quitting, ask your health care provider.  If you are sexually active, practice safe sex. Use a condom or other form of birth control (contraception) in order to prevent pregnancy and STIs (sexually transmitted infections).  If told by your health care provider, take low-dose aspirin daily starting at age 66. What's next?  Visit your health care provider once a year for a  well check visit.  Ask your health care provider how often you should have your eyes and teeth checked.  Stay up to date on all vaccines. This information is not intended to replace advice given to you by your health care provider. Make sure you discuss any questions you have with your health care provider. Document Released: 07/07/2015 Document Revised: 02/19/2018 Document Reviewed: 02/19/2018 Elsevier Patient Education  2020 [employer].

## 2019-04-20 NOTE — Assessment & Plan Note (Signed)
Well controlled, no changes to meds. Encouraged heart healthy diet such as the DASH diet and exercise as tolerated.  °

## 2019-04-20 NOTE — Assessment & Plan Note (Signed)
Right upper lid is following with dermatology, Dr Loyal Jacobson of dermatology, was referred to opthamology, Dr Shelbie Proctor. He lanced it and it is persistent irritation. He put her on Polysporin but she does not tolerate petroleum so she is switched to Polytrim. She will follow up with him if irritation persists

## 2019-04-20 NOTE — Assessment & Plan Note (Addendum)
hgba1c acceptable, minimize simple carbs. Increase exercise as tolerated. Stable despite increased carbohydrate intake.

## 2019-04-20 NOTE — Assessment & Plan Note (Signed)
Encouraged DASH diet, decrease po intake and increase exercise as tolerated. Needs 7-8 hours of sleep nightly. Avoid trans fats, eat small, frequent meals every 4-5 hours with lean proteins, complex carbs and healthy fats. Minimize simple carbs, consider [place] APP or NOOM APP

## 2019-04-21 NOTE — Assessment & Plan Note (Signed)
Patient encouraged to maintain heart healthy diet, regular exercise, adequate sleep. Consider daily probiotics. Take medications as prescribed. Labs ordered and reviewed. Is scheduled for [facility] in December and colonoscopy is UTD completed in 2018

## 2019-04-21 NOTE — Progress Notes (Signed)
Subjective:    [name], female    DOB: 1955-08-03, 63 y.o.   MRN: [id number]  No chief complaint on file.   HPI Patient is in today for annual preventative exam and follow up on chronic medical concerns including hypertension, hyperlipidemia, and hyperglycemia. No recent febrile illness or hospitalizations. No polyuria or polydipsia. She is very stressed at work and is looking forward to retirement in January 2021. She acknowledges she is not eating well every day. Eating more carbs than usual and is frustrated with weight gain and fatigue. She has been struggling with a stye on her right upper ear lid and she was referred to opthamology and they had to lance it. It was improving but she was given an ointment and that seems to have caused irritation and discharge. Denies CP/palp/SOB/HA/congestion/fevers/GI or GU c/o. Taking meds as prescribed  Past Medical History:  Diagnosis Date  . Adenomatous colon polyp   . Allergic rhinitis   . Allergy    late fall, spring   . Anemia    as teenager   . Breast abscess 09/22/2014   recurrent  . Colon polyps   . Depression    pt denies- uses venlafaxine with menopause   . Facial lesion 06/2010   removed from right side of face--precancerous  . GERD (gastroesophageal reflux disease)   . Hypertension   . Hypoglycemia   . Hypoglycemia   . Osteoarthritis    knees neck and hands- OA  . Pain of left heel 02/17/2017  . Preventative health care 03/07/2014  . Snoring 2008   negative sleep study of OSA  . Tendonitis 08/16/2016  . Visual disturbance    visual flashes, referred to [facility] (Normal funduscopic exam)    Past Surgical History:  Procedure Laterality Date  . ABDOMINAL HYSTERECTOMY  2002  . CARPAL TUNNEL RELEASE  05/2010, 06/2010   bilateral--Dr Tamera Punt  . CERVICAL FUSION  06/15/09   right sided C6-C7 radiculopathy status post anterior cervical diskectomy and fusion at the C5-6 and C6-7 levels. Phylliss Bob, MD  . CHOLECYSTECTOMY N/A 10/10/2016   Procedure: LAPAROSCOPIC CHOLECYSTECTOMY;  Surgeon: Autumn Messing III, MD;  Location: [facility];  Service: General;  Laterality: N/A;  . COLONOSCOPY    . DORSAL COMPARTMENT RELEASE Left 02/06/2015   Procedure: DEQUERVAIN'S RELEASE;  Surgeon: Milly Jakob, MD;  Location: [place];  Service: Orthopedics;  Laterality: Left;  . FRACTURE SURGERY  08/12/2014 & 02/06/2015   Original break in Feb & add'l surg in Aug  Plate in Left arm  . HARDWARE REMOVAL Left 02/06/2015   Procedure: LEFT WRIST REMOVAL OF HARDWARE;  Surgeon: Milly Jakob, MD;  Location: [place];  Service: Orthopedics;  Laterality: Left;  . Left wrist surgery     Feb.19th 2016  . NASAL SEPTUM SURGERY    . SPINE SURGERY  5 +/- yrs ago   Neck vertebrae fusion  . Surgery on left ankle for fracture      Family History  Problem Relation Age of Onset  . Stroke Mother   . Colon cancer Mother   . Hypertension Mother   . Hyperlipidemia Mother   . Arthritis Mother        OA and rheumatoid  . Cancer Mother        colon  . Heart disease Mother        MI 33  . Colon polyps Mother   . Alcohol abuse Father   .  Heart disease Father   . Hypertension Father   . Colon polyps Father   . Arthritis Maternal Grandmother        RA and OA  . Diabetes Maternal Grandmother   . Heart disease Maternal Grandmother   . Hypertension Maternal Grandmother   . Stroke Maternal Grandmother   . Heart disease Maternal Grandfather   . Hypertension Maternal Grandfather   . Diabetes Paternal Grandfather   . Esophageal cancer Neg Hx   . Rectal cancer Neg Hx   . Stomach cancer Neg Hx     Social History   Socioeconomic History  . Marital status: Married    Spouse name: Not on file  . Number of children: Not on file  . Years of education: Not on file  . Highest education level: Not on file  Occupational History  . Occupation: Primary school teacher for [employer]  . Financial resource strain: Not on file  . Food insecurity    Worry: Not on file    Inability: Not on file  . Transportation needs    Medical: Not on file    Non-medical: Not on file  Tobacco Use  . Smoking status: Never Smoker  . Smokeless tobacco: Never Used  Substance and Sexual Activity  . Alcohol use: No  . Drug use: No  . Sexual activity: Yes    Comment: lives with husband, no dietary restrictions  Lifestyle  . Physical activity    Days per week: Not on file    Minutes per session: Not on file  . Stress: Not on file  Relationships  . Social Herbalist on phone: Not on file    Gets together: Not on file    Attends religious service: Not on file    Active member of club or organization: Not on file    Attends meetings of clubs or organizations: Not on file    Relationship status: Not on file  . Intimate partner violence    Fear of current or ex partner: Not on file    Emotionally abused: Not on file    Physically abused: Not on file    Forced sexual activity: Not on file  Other Topics Concern  . Not on file  Social History Narrative  . Not on file    Outpatient Medications Prior to Visit  Medication Sig Dispense Refill  . aspirin 81 MG EC tablet Take 81 mg by mouth daily.      Marland Kitchen estradiol (CLIMARA - DOSED IN MG/24 HR) 0.025 mg/24hr patch Place 1 patch (0.025 mg total) onto the skin once a week. 12 patch 2  . fexofenadine (ALLEGRA) 180 MG tablet Take 180 mg by mouth at bedtime as needed for allergies.     Marland Kitchen gabapentin (NEURONTIN) 300 MG capsule Take 1 capsule (300 mg total) by mouth 2 (two) times daily. 180 capsule 3  . irbesartan-hydrochlorothiazide (AVALIDE) 300-12.5 MG tablet TAKE 1 TABLET DAILY 90 tablet 3  . Krill Oil 350 MG CAPS Take 350 mg by mouth daily.    . Misc Natural Products (GLUCOSAMINE CHONDROITIN TRIPLE) TABS Take 1 tablet by mouth 2 (two) times daily.    . pantoprazole (PROTONIX) 40 MG tablet TAKE 1 TABLET DAILY 90  tablet 3  . Soy Isoflavones 40 MG TABS Take 40 mg by mouth 2 (two) times daily.     Marland Kitchen venlafaxine XR (EFFEXOR-XR) 75 MG 24 hr capsule TAKE 1 CAPSULE BY MOUTH DAILY WITH BREAKFAST  90 capsule 3   No facility-administered medications prior to visit.     No Known Allergies  Review of Systems  Constitutional: Positive for malaise/fatigue. Negative for chills and fever.  HENT: Negative for congestion and hearing loss.   Eyes: Positive for discharge and redness.  Respiratory: Negative for cough, sputum production and shortness of breath.   Cardiovascular: Negative for chest pain, palpitations and leg swelling.  Gastrointestinal: Negative for abdominal pain, blood in stool, constipation, diarrhea, heartburn, nausea and vomiting.  Genitourinary: Negative for dysuria, frequency, hematuria and urgency.  Musculoskeletal: Negative for back pain, falls and myalgias.  Skin: Negative for rash.  Neurological: Negative for dizziness, sensory change, loss of consciousness, weakness and headaches.  Endo/Heme/Allergies: Negative for environmental allergies. Does not bruise/bleed easily.  Psychiatric/Behavioral: Negative for depression and suicidal ideas. The patient is nervous/anxious. The patient does not have insomnia.        Objective:    Physical Exam Constitutional:      General: She is not in acute distress.    Appearance: She is well-developed.  HENT:     Head: Normocephalic and atraumatic.  Eyes:     General:        Right eye: Discharge present.     Conjunctiva/sclera: Conjunctivae normal.  Neck:     Musculoskeletal: Neck supple.     Thyroid: No thyromegaly.  Cardiovascular:     Rate and Rhythm: Normal rate and regular rhythm.     Heart sounds: Normal heart sounds. No murmur.  Pulmonary:     Effort: Pulmonary effort is normal. No respiratory distress.     Breath sounds: Normal breath sounds.  Abdominal:     General: Bowel sounds are normal. There is no distension.     Palpations:  Abdomen is soft. There is no mass.     Tenderness: There is no abdominal tenderness.  Lymphadenopathy:     Cervical: No cervical adenopathy.  Skin:    General: Skin is warm and dry.  Neurological:     Mental Status: She is alert and oriented to person, place, and time.  Psychiatric:        Behavior: Behavior normal.     BP (!) 102/58 (BP Location: Left Arm, Patient Position: Sitting, Cuff Size: Normal)   Pulse 71   Temp 97.9 F (36.6 C) (Temporal)   Resp 18   Ht 5\' 4"  (1.626 m)   Wt 210 lb (95.3 kg)   SpO2 97%   BMI 36.05 kg/m  Wt Readings from Last 3 Encounters:  04/20/19 210 lb (95.3 kg)  09/07/18 210 lb 9.6 oz (95.5 kg)  03/09/18 210 lb 12.8 oz (95.6 kg)    Diabetic Foot Exam - Simple   No data filed     Lab Results  Component Value Date   WBC 4.0 03/15/2019   HGB 14.0 03/15/2019   HCT 41.0 03/15/2019   PLT 182.0 03/15/2019   GLUCOSE 97 03/15/2019   CHOL 178 03/15/2019   TRIG 203.0 (H) 03/15/2019   HDL 37.00 (L) 03/15/2019   LDLDIRECT 115.0 03/15/2019   LDLCALC 100 (H) 08/31/2018   ALT 14 03/15/2019   AST 15 03/15/2019   NA 143 03/15/2019   K 4.0 03/15/2019   CL 104 03/15/2019   CREATININE 0.97 03/15/2019   BUN 16 03/15/2019   CO2 32 03/15/2019   TSH 1.61 03/15/2019   INR 0.97 06/13/2009   HGBA1C 5.2 03/15/2019    Lab Results  Component Value Date   TSH 1.61 03/15/2019  Lab Results  Component Value Date   WBC 4.0 03/15/2019   HGB 14.0 03/15/2019   HCT 41.0 03/15/2019   MCV 89.3 03/15/2019   PLT 182.0 03/15/2019   Lab Results  Component Value Date   NA 143 03/15/2019   K 4.0 03/15/2019   CO2 32 03/15/2019   GLUCOSE 97 03/15/2019   BUN 16 03/15/2019   CREATININE 0.97 03/15/2019   BILITOT 0.4 03/15/2019   ALKPHOS 84 03/15/2019   AST 15 03/15/2019   ALT 14 03/15/2019   PROT 6.2 03/15/2019   ALBUMIN 3.9 03/15/2019   CALCIUM 9.3 03/15/2019   ANIONGAP 9 10/09/2016   GFR 57.96 (L) 03/15/2019   Lab Results  Component Value Date    CHOL 178 03/15/2019   Lab Results  Component Value Date   HDL 37.00 (L) 03/15/2019   Lab Results  Component Value Date   LDLCALC 100 (H) 08/31/2018   Lab Results  Component Value Date   TRIG 203.0 (H) 03/15/2019   Lab Results  Component Value Date   CHOLHDL 5 03/15/2019   Lab Results  Component Value Date   HGBA1C 5.2 03/15/2019       Assessment & Plan:   Problem List Items Addressed This Visit    Essential hypertension    Well controlled, no changes to meds. Encouraged heart healthy diet such as the DASH diet and exercise as tolerated.       Relevant Orders   CBC   TSH   Comprehensive metabolic panel   Hyperlipidemia, mixed    Encouraged heart healthy diet, increase exercise, avoid trans fats and simple carbs      Relevant Orders   Lipid panel   Obesity    Encouraged DASH diet, decrease po intake and increase exercise as tolerated. Needs 7-8 hours of sleep nightly. Avoid trans fats, eat small, frequent meals every 4-5 hours with lean proteins, complex carbs and healthy fats. Minimize simple carbs, consider Kenneth APP or NOOM APP      Relevant Orders   Hemoglobin A1c   Preventative health care    Patient encouraged to maintain heart healthy diet, regular exercise, adequate sleep. Consider daily probiotics. Take medications as prescribed. Labs ordered and reviewed. Is scheduled for MGM in December and colonoscopy is UTD completed in 2018      Hordeolum externum (stye)    Right upper lid is following with dermatology, Dr Loyal Jacobson of dermatology, was referred to opthamology, Dr Shelbie Proctor. He lanced it and it is persistent irritation. He put her on Polysporin but she does not tolerate petroleum so she is switched to Polytrim. She will follow up with him if irritation persists      Hyperglycemia    hgba1c acceptable, minimize simple carbs. Increase exercise as tolerated. Stable despite increased carbohydrate intake.       Relevant Orders   Hemoglobin A1c       I am having Julie Palms. Inscoe "Cindy" start on trimethoprim-polymyxin b. I am also having her maintain her fexofenadine, Soy Isoflavones, aspirin, Krill Oil, Glucosamine Chondroitin Triple, gabapentin, venlafaxine XR, estradiol, pantoprazole, and irbesartan-hydrochlorothiazide.  Meds ordered this encounter  Medications  . trimethoprim-polymyxin b (POLYTRIM) ophthalmic solution    Sig: Place 1-2 drops into the right eye every 6 (six) hours.    Dispense:  10 mL    Refill:  0

## 2019-05-02 ENCOUNTER — Other Ambulatory Visit: Payer: Self-pay | Admitting: Family Medicine

## 2019-06-07 LAB — HM PAP SMEAR: HM Pap smear: NEGATIVE

## 2019-06-08 ENCOUNTER — Encounter: Payer: Self-pay | Admitting: Family Medicine

## 2019-06-08 LAB — HM MAMMOGRAPHY

## 2019-06-28 ENCOUNTER — Encounter: Payer: Self-pay | Admitting: Family Medicine

## 2019-07-14 ENCOUNTER — Other Ambulatory Visit: Payer: Self-pay | Admitting: Family Medicine

## 2019-08-25 ENCOUNTER — Other Ambulatory Visit: Payer: Self-pay | Admitting: Family Medicine

## 2019-08-25 DIAGNOSIS — I10 Essential (primary) hypertension: Secondary | ICD-10-CM

## 2019-08-25 DIAGNOSIS — Z780 Asymptomatic menopausal state: Secondary | ICD-10-CM

## 2019-08-25 DIAGNOSIS — F418 Other specified anxiety disorders: Secondary | ICD-10-CM

## 2019-08-25 DIAGNOSIS — K219 Gastro-esophageal reflux disease without esophagitis: Secondary | ICD-10-CM

## 2019-08-25 DIAGNOSIS — Z23 Encounter for immunization: Secondary | ICD-10-CM

## 2019-09-14 ENCOUNTER — Encounter: Payer: Self-pay | Admitting: Family Medicine

## 2019-10-11 ENCOUNTER — Other Ambulatory Visit: Payer: Self-pay

## 2019-10-11 ENCOUNTER — Other Ambulatory Visit (INDEPENDENT_AMBULATORY_CARE_PROVIDER_SITE_OTHER): Payer: No Typology Code available for payment source

## 2019-10-11 DIAGNOSIS — R739 Hyperglycemia, unspecified: Secondary | ICD-10-CM | POA: Diagnosis not present

## 2019-10-11 DIAGNOSIS — E6609 Other obesity due to excess calories: Secondary | ICD-10-CM

## 2019-10-11 DIAGNOSIS — E782 Mixed hyperlipidemia: Secondary | ICD-10-CM

## 2019-10-11 DIAGNOSIS — I10 Essential (primary) hypertension: Secondary | ICD-10-CM

## 2019-10-11 LAB — LIPID PANEL
Cholesterol: 193 mg/dL (ref 0–200)
HDL: 41.2 mg/dL (ref >39.00)
LDL Cholesterol: 113 mg/dL — ABNORMAL HIGH (ref 0–99)
NonHDL: 151.53
Total CHOL/HDL Ratio: 5
Triglycerides: 191 mg/dL — ABNORMAL HIGH (ref 0.0–149.0)
VLDL: 38.2 mg/dL (ref 0.0–40.0)

## 2019-10-11 LAB — COMPREHENSIVE METABOLIC PANEL
ALT: 13 U/L (ref 0–35)
AST: 14 U/L (ref 0–37)
Albumin: 4 g/dL (ref 3.5–5.2)
Alkaline Phosphatase: 90 U/L (ref 39–117)
BUN: 16 mg/dL (ref 6–23)
CO2: 33 mEq/L — ABNORMAL HIGH (ref 19–32)
Calcium: 9.2 mg/dL (ref 8.4–10.5)
Chloride: 103 mEq/L (ref 96–112)
Creatinine, Ser: 1 mg/dL (ref 0.40–1.20)
GFR: 55.85 mL/min — ABNORMAL LOW (ref >60.00)
Glucose, Bld: 105 mg/dL — ABNORMAL HIGH (ref 70–99)
Potassium: 4.2 mEq/L (ref 3.5–5.1)
Sodium: 142 mEq/L (ref 135–145)
Total Bilirubin: 0.5 mg/dL (ref 0.2–1.2)
Total Protein: 6.3 g/dL (ref 6.0–8.3)

## 2019-10-11 LAB — CBC
HCT: 41.6 % (ref 36.0–46.0)
Hemoglobin: 14.3 g/dL (ref 12.0–15.0)
MCHC: 34.3 g/dL (ref 30.0–36.0)
MCV: 89 fl (ref 78.0–100.0)
Platelets: 175 10*3/uL (ref 150.0–400.0)
RBC: 4.67 Mil/uL (ref 3.87–5.11)
RDW: 12.8 % (ref 11.5–15.5)
WBC: 3.9 10*3/uL — ABNORMAL LOW (ref 4.0–10.5)

## 2019-10-11 LAB — HEMOGLOBIN A1C: Hgb A1c MFr Bld: 5 % (ref 4.6–6.5)

## 2019-10-11 LAB — TSH: TSH: 2.74 u[IU]/mL (ref 0.35–4.50)

## 2019-10-18 ENCOUNTER — Encounter: Payer: Self-pay | Admitting: Family Medicine

## 2019-10-18 ENCOUNTER — Other Ambulatory Visit: Payer: Self-pay | Admitting: Family Medicine

## 2019-10-18 ENCOUNTER — Ambulatory Visit (INDEPENDENT_AMBULATORY_CARE_PROVIDER_SITE_OTHER): Payer: No Typology Code available for payment source | Admitting: Family Medicine

## 2019-10-18 ENCOUNTER — Other Ambulatory Visit: Payer: Self-pay

## 2019-10-18 DIAGNOSIS — I10 Essential (primary) hypertension: Secondary | ICD-10-CM | POA: Diagnosis not present

## 2019-10-18 DIAGNOSIS — R739 Hyperglycemia, unspecified: Secondary | ICD-10-CM | POA: Diagnosis not present

## 2019-10-18 DIAGNOSIS — E782 Mixed hyperlipidemia: Secondary | ICD-10-CM | POA: Diagnosis not present

## 2019-10-18 MED ORDER — VENLAFAXINE HCL ER 37.5 MG PO CP24: 37.5000 mg | ORAL_CAPSULE | Freq: Every day | ORAL | 1 refills | Status: DC

## 2019-10-18 NOTE — Assessment & Plan Note (Signed)
Well controlled, no changes to meds. Encouraged heart healthy diet such as the DASH diet and exercise as tolerated.  °

## 2019-10-18 NOTE — Assessment & Plan Note (Signed)
hgba1c acceptable, minimize simple carbs. Increase exercise as tolerated.  

## 2019-10-18 NOTE — Patient Instructions (Signed)
Omron Blood Pressure cuff, upper arm, want BP 100-140/60-90 Pulse oximeter, want oxygen in 90s  Weekly vitals  Take Multivitamin with minerals, selenium Vitamin D 1000-2000 IU daily Probiotic with lactobacillus and bifidophilus Asprin EC 81 mg daily Fish or Krill oil daily Melatonin 2-5 mg at bedtime  [email or web address]

## 2019-10-18 NOTE — Assessment & Plan Note (Signed)
Encouraged heart healthy diet, increase exercise, avoid trans fats, consider a krill oil cap daily 

## 2019-10-18 NOTE — Progress Notes (Signed)
Subjective:    [name], female    DOB: February 11, 1956, 64 y.o.   MRN: [id number]  Chief Complaint  Patient presents with  . 6 month follow up    HPI Patient is in today for follow up on chronic medical concerns. She feels well. No recent febrile illness or hospitalizations. She has retired and has been traveling more. She declines the COVID and Flu shots even after discussion. No c/o polyuria or polydipsia. Denies CP/palp/SOB/HA/congestion/fevers/GI or GU c/o. Taking meds as prescribed  Past Medical History:  Diagnosis Date  . Adenomatous colon polyp   . Allergic rhinitis   . Allergy    late fall, spring   . Anemia    as teenager   . Breast abscess 09/22/2014   recurrent  . Colon polyps   . Depression    pt denies- uses venlafaxine with menopause   . Facial lesion 06/2010   removed from right side of face--precancerous  . GERD (gastroesophageal reflux disease)   . Hypertension   . Hypoglycemia   . Hypoglycemia   . Osteoarthritis    knees neck and hands- OA  . Pain of left heel 02/17/2017  . Preventative health care 03/07/2014  . Snoring 2008   negative sleep study of OSA  . Tendonitis 08/16/2016  . Visual disturbance    visual flashes, referred to [facility] (Normal funduscopic exam)    Past Surgical History:  Procedure Laterality Date  . ABDOMINAL HYSTERECTOMY  2002  . CARPAL TUNNEL RELEASE  05/2010, 06/2010   bilateral--Dr Tamera Punt  . CERVICAL FUSION  06/15/09   right sided C6-C7 radiculopathy status post anterior cervical diskectomy and fusion at the C5-6 and C6-7 levels. Phylliss Bob, MD  . CHOLECYSTECTOMY N/A 10/10/2016   Procedure: LAPAROSCOPIC CHOLECYSTECTOMY;  Surgeon: Autumn Messing III, MD;  Location: [facility];  Service: General;  Laterality: N/A;  . COLONOSCOPY    . DORSAL COMPARTMENT RELEASE Left 02/06/2015   Procedure: DEQUERVAIN'S RELEASE;  Surgeon: Milly Jakob, MD;  Location: [place];  Service: Orthopedics;   Laterality: Left;  . FRACTURE SURGERY  08/12/2014 & 02/06/2015   Original break in Feb & add'l surg in Aug  Plate in Left arm  . HARDWARE REMOVAL Left 02/06/2015   Procedure: LEFT WRIST REMOVAL OF HARDWARE;  Surgeon: Milly Jakob, MD;  Location: [place];  Service: Orthopedics;  Laterality: Left;  . Left wrist surgery     Feb.19th 2016  . NASAL SEPTUM SURGERY    . SPINE SURGERY  5 +/- yrs ago   Neck vertebrae fusion  . Surgery on left ankle for fracture      Family History  Problem Relation Age of Onset  . Stroke Mother   . Colon cancer Mother   . Hypertension Mother   . Hyperlipidemia Mother   . Arthritis Mother        OA and rheumatoid  . Cancer Mother        colon  . Heart disease Mother        MI 69  . Colon polyps Mother   . Alcohol abuse Father   . Heart disease Father   . Hypertension Father   . Colon polyps Father   . Arthritis Maternal Grandmother        RA and OA  . Diabetes Maternal Grandmother   . Heart disease Maternal Grandmother   . Hypertension Maternal Grandmother   . Stroke Maternal Grandmother   . Heart  disease Maternal Grandfather   . Hypertension Maternal Grandfather   . Diabetes Paternal Grandfather   . Esophageal cancer Neg Hx   . Rectal cancer Neg Hx   . Stomach cancer Neg Hx     Social History   Socioeconomic History  . Marital status: Married    Spouse name: Not on file  . Number of children: Not on file  . Years of education: Not on file  . Highest education level: Not on file  Occupational History  . Occupation: Primary school teacher for [employer]  Tobacco Use  . Smoking status: Never Smoker  . Smokeless tobacco: Never Used  Substance and Sexual Activity  . Alcohol use: No  . Drug use: No  . Sexual activity: Yes    Comment: lives with husband, no dietary restrictions  Other Topics Concern  . Not on file  Social History Narrative  . Not on file   Social Determinants of Health   Financial  Resource Strain:   . Difficulty of Paying Living Expenses:   Food Insecurity:   . Worried About Charity fundraiser in the Last Year:   . Arboriculturist in the Last Year:   Transportation Needs:   . Film/video editor (Medical):   Marland Kitchen Lack of Transportation (Non-Medical):   Physical Activity:   . Days of Exercise per Week:   . Minutes of Exercise per Session:   Stress:   . Feeling of Stress :   Social Connections:   . Frequency of Communication with Friends and Family:   . Frequency of Social Gatherings with Friends and Family:   . Attends Religious Services:   . Active Member of Clubs or Organizations:   . Attends Archivist Meetings:   Marland Kitchen Marital Status:   Intimate Partner Violence:   . Fear of Current or Ex-Partner:   . Emotionally Abused:   Marland Kitchen Physically Abused:   . Sexually Abused:     Outpatient Medications Prior to Visit  Medication Sig Dispense Refill  . aspirin 81 MG EC tablet Take 81 mg by mouth daily.      Marland Kitchen estradiol (CLIMARA - DOSED IN MG/24 HR) 0.025 mg/24hr patch APPLY 1 PATCH TO SKIN ONCE A WEEK 4 patch 3  . fexofenadine (ALLEGRA) 180 MG tablet Take 180 mg by mouth at bedtime as needed for allergies.     Marland Kitchen gabapentin (NEURONTIN) 300 MG capsule Take 1 capsule (300 mg total) by mouth 2 (two) times daily. 180 capsule 3  . irbesartan-hydrochlorothiazide (AVALIDE) 300-12.5 MG tablet TAKE 1 TABLET DAILY 90 tablet 3  . Krill Oil 350 MG CAPS Take 350 mg by mouth daily.    . Misc Natural Products (GLUCOSAMINE CHONDROITIN TRIPLE) TABS Take 1 tablet by mouth 2 (two) times daily.    . pantoprazole (PROTONIX) 40 MG tablet TAKE 1 TABLET DAILY 90 tablet 3  . Soy Isoflavones 40 MG TABS Take 40 mg by mouth 2 (two) times daily.     Marland Kitchen venlafaxine XR (EFFEXOR-XR) 75 MG 24 hr capsule TAKE 1 CAPSULE DAILY WITH BREAKFAST 90 capsule 1  . trimethoprim-polymyxin b (POLYTRIM) ophthalmic solution Place 1-2 drops into the right eye every 6 (six) hours. 10 mL 0   No  facility-administered medications prior to visit.    No Known Allergies  Review of Systems  Constitutional: Negative for fever and malaise/fatigue.  HENT: Negative for congestion.   Eyes: Negative for blurred vision.  Respiratory: Negative for shortness of breath.  Cardiovascular: Negative for chest pain, palpitations and leg swelling.  Gastrointestinal: Negative for abdominal pain, blood in stool and nausea.  Genitourinary: Negative for dysuria and frequency.  Musculoskeletal: Negative for falls.  Skin: Negative for rash.  Neurological: Negative for dizziness, loss of consciousness and headaches.  Endo/Heme/Allergies: Negative for environmental allergies.  Psychiatric/Behavioral: Negative for depression. The patient is not nervous/anxious.        Objective:    Physical Exam Vitals and nursing note reviewed.  Constitutional:      General: She is not in acute distress.    Appearance: She is well-developed.  HENT:     Head: Normocephalic and atraumatic.     Nose: Nose normal.  Eyes:     General:        Right eye: No discharge.        Left eye: No discharge.  Cardiovascular:     Rate and Rhythm: Normal rate and regular rhythm.     Heart sounds: No murmur.  Pulmonary:     Effort: Pulmonary effort is normal.     Breath sounds: Normal breath sounds.  Abdominal:     General: Bowel sounds are normal.     Palpations: Abdomen is soft.     Tenderness: There is no abdominal tenderness.  Musculoskeletal:     Cervical back: Normal range of motion and neck supple.  Skin:    General: Skin is warm and dry.  Neurological:     Mental Status: She is alert and oriented to person, place, and time.     BP 116/60 (BP Location: Right Arm, Cuff Size: Normal)   Pulse 75   Temp 97.9 F (36.6 C) (Temporal)   Resp 12   Ht 5\' 4"  (1.626 m)   Wt 208 lb 9.6 oz (94.6 kg)   SpO2 97%   BMI 35.81 kg/m  Wt Readings from Last 3 Encounters:  10/18/19 208 lb 9.6 oz (94.6 kg)  04/20/19 210 lb  (95.3 kg)  09/07/18 210 lb 9.6 oz (95.5 kg)    Diabetic Foot Exam - Simple   No data filed     Lab Results  Component Value Date   WBC 3.9 (L) 10/11/2019   HGB 14.3 10/11/2019   HCT 41.6 10/11/2019   PLT 175.0 10/11/2019   GLUCOSE 105 (H) 10/11/2019   CHOL 193 10/11/2019   TRIG 191.0 (H) 10/11/2019   HDL 41.20 10/11/2019   LDLDIRECT 115.0 03/15/2019   LDLCALC 113 (H) 10/11/2019   ALT 13 10/11/2019   AST 14 10/11/2019   NA 142 10/11/2019   K 4.2 10/11/2019   CL 103 10/11/2019   CREATININE 1.00 10/11/2019   BUN 16 10/11/2019   CO2 33 (H) 10/11/2019   TSH 2.74 10/11/2019   INR 0.97 06/13/2009   HGBA1C 5.0 10/11/2019    Lab Results  Component Value Date   TSH 2.74 10/11/2019   Lab Results  Component Value Date   WBC 3.9 (L) 10/11/2019   HGB 14.3 10/11/2019   HCT 41.6 10/11/2019   MCV 89.0 10/11/2019   PLT 175.0 10/11/2019   Lab Results  Component Value Date   NA 142 10/11/2019   K 4.2 10/11/2019   CO2 33 (H) 10/11/2019   GLUCOSE 105 (H) 10/11/2019   BUN 16 10/11/2019   CREATININE 1.00 10/11/2019   BILITOT 0.5 10/11/2019   ALKPHOS 90 10/11/2019   AST 14 10/11/2019   ALT 13 10/11/2019   PROT 6.3 10/11/2019   ALBUMIN 4.0 10/11/2019  CALCIUM 9.2 10/11/2019   ANIONGAP 9 10/09/2016   GFR 55.85 (L) 10/11/2019   Lab Results  Component Value Date   CHOL 193 10/11/2019   Lab Results  Component Value Date   HDL 41.20 10/11/2019   Lab Results  Component Value Date   LDLCALC 113 (H) 10/11/2019   Lab Results  Component Value Date   TRIG 191.0 (H) 10/11/2019   Lab Results  Component Value Date   CHOLHDL 5 10/11/2019   Lab Results  Component Value Date   HGBA1C 5.0 10/11/2019       Assessment & Plan:   Problem List Items Addressed This Visit    Essential hypertension    Well controlled, no changes to meds. Encouraged heart healthy diet such as the DASH diet and exercise as tolerated.       Relevant Orders   CBC   Comprehensive metabolic  panel   TSH   Hyperlipidemia, mixed    Encouraged heart healthy diet, increase exercise, avoid trans fats, consider a krill oil cap daily      Relevant Orders   Lipid panel   Hyperglycemia    hgba1c acceptable, minimize simple carbs. Increase exercise as tolerated.       Relevant Orders   Hemoglobin A1c      I have discontinued Ranae Palms. Goodson "Cindy"'s trimethoprim-polymyxin b. I am also having her maintain her fexofenadine, Soy Isoflavones, aspirin, Krill Oil, Glucosamine Chondroitin Triple, gabapentin, pantoprazole, irbesartan-hydrochlorothiazide, estradiol, and venlafaxine XR.  No orders of the defined types were placed in this encounter.    Penni Homans, MD

## 2019-10-19 ENCOUNTER — Encounter: Payer: Self-pay | Admitting: *Deleted

## 2019-10-20 MED ORDER — VENLAFAXINE HCL ER 37.5 MG PO CP24
ORAL_CAPSULE | ORAL | 0 refills | Status: DC
Start: 2019-10-20 — End: 2020-12-12

## 2019-10-20 NOTE — Telephone Encounter (Signed)
Spoke with pt. She did not pick up 10/18/19 RX that was sent to [facility] in [place]. Spoke with pharmacy and cancelled RX.  Pt now requests that RX be sent to [facility] in [place]. [place].  RX sent.

## 2020-01-27 ENCOUNTER — Encounter: Payer: Self-pay | Admitting: Family Medicine

## 2020-01-27 DIAGNOSIS — M791 Myalgia, unspecified site: Secondary | ICD-10-CM

## 2020-01-27 DIAGNOSIS — Z780 Asymptomatic menopausal state: Secondary | ICD-10-CM

## 2020-01-27 DIAGNOSIS — I10 Essential (primary) hypertension: Secondary | ICD-10-CM

## 2020-01-27 DIAGNOSIS — Z23 Encounter for immunization: Secondary | ICD-10-CM

## 2020-01-28 MED ORDER — PANTOPRAZOLE SODIUM 40 MG PO TBEC: 40.0000 mg | DELAYED_RELEASE_TABLET | Freq: Every day | ORAL | 3 refills | Status: DC

## 2020-01-28 MED ORDER — GABAPENTIN 300 MG PO CAPS: 300.0000 mg | ORAL_CAPSULE | Freq: Two times a day (BID) | ORAL | 3 refills | Status: DC

## 2020-01-28 MED ORDER — IRBESARTAN-HYDROCHLOROTHIAZIDE 300-12.5 MG PO TABS: 1.0000 | ORAL_TABLET | Freq: Every day | ORAL | 3 refills | Status: DC

## 2020-02-29 ENCOUNTER — Encounter: Payer: Self-pay | Admitting: Gastroenterology

## 2020-04-14 NOTE — Addendum Note (Signed)
Addended by: Kelle Darting A on: 04/14/2020 09:12 AM   Modules accepted: Orders

## 2020-04-18 ENCOUNTER — Other Ambulatory Visit: Payer: Self-pay

## 2020-04-18 ENCOUNTER — Other Ambulatory Visit (INDEPENDENT_AMBULATORY_CARE_PROVIDER_SITE_OTHER): Payer: No Typology Code available for payment source

## 2020-04-18 DIAGNOSIS — R739 Hyperglycemia, unspecified: Secondary | ICD-10-CM

## 2020-04-18 DIAGNOSIS — I10 Essential (primary) hypertension: Secondary | ICD-10-CM

## 2020-04-18 DIAGNOSIS — E782 Mixed hyperlipidemia: Secondary | ICD-10-CM

## 2020-04-19 LAB — COMPREHENSIVE METABOLIC PANEL
AG Ratio: 1.7 (calc) (ref 1.0–2.5)
ALT: 16 U/L (ref 6–29)
AST: 18 U/L (ref 10–35)
Albumin: 4.2 g/dL (ref 3.6–5.1)
Alkaline phosphatase (APISO): 96 U/L (ref 37–153)
BUN/Creatinine Ratio: 15 (calc) (ref 6–22)
BUN: 16 mg/dL (ref 7–25)
CO2: 31 mmol/L (ref 20–32)
Calcium: 10 mg/dL (ref 8.6–10.4)
Chloride: 104 mmol/L (ref 98–110)
Creat: 1.07 mg/dL — ABNORMAL HIGH (ref 0.50–0.99)
Globulin: 2.5 g/dL (calc) (ref 1.9–3.7)
Glucose, Bld: 97 mg/dL (ref 65–99)
Potassium: 3.9 mmol/L (ref 3.5–5.3)
Sodium: 143 mmol/L (ref 135–146)
Total Bilirubin: 0.5 mg/dL (ref 0.2–1.2)
Total Protein: 6.7 g/dL (ref 6.1–8.1)

## 2020-04-19 LAB — LIPID PANEL
Cholesterol: 174 mg/dL (ref <200)
HDL: 45 mg/dL — ABNORMAL LOW (ref >=50)
LDL Cholesterol (Calc): 103 mg/dL (calc) — ABNORMAL HIGH
Non-HDL Cholesterol (Calc): 129 mg/dL (calc) (ref <130)
Total CHOL/HDL Ratio: 3.9 (calc) (ref <5.0)
Triglycerides: 163 mg/dL — ABNORMAL HIGH (ref <150)

## 2020-04-19 LAB — CBC
HCT: 42.6 % (ref 35.0–45.0)
Hemoglobin: 14.4 g/dL (ref 11.7–15.5)
MCH: 30.4 pg (ref 27.0–33.0)
MCHC: 33.8 g/dL (ref 32.0–36.0)
MCV: 90.1 fL (ref 80.0–100.0)
MPV: 11.9 fL (ref 7.5–12.5)
Platelets: 201 10*3/uL (ref 140–400)
RBC: 4.73 10*6/uL (ref 3.80–5.10)
RDW: 12.4 % (ref 11.0–15.0)
WBC: 4.4 10*3/uL (ref 3.8–10.8)

## 2020-04-19 LAB — HEMOGLOBIN A1C
Hgb A1c MFr Bld: 4.8 % of total Hgb (ref <5.7)
Mean Plasma Glucose: 91 (calc)
eAG (mmol/L): 5 (calc)

## 2020-04-19 LAB — TSH: TSH: 2.03 mIU/L (ref 0.40–4.50)

## 2020-04-24 ENCOUNTER — Other Ambulatory Visit: Payer: No Typology Code available for payment source

## 2020-05-01 ENCOUNTER — Encounter: Payer: No Typology Code available for payment source | Admitting: Family Medicine

## 2020-05-09 ENCOUNTER — Telehealth: Payer: Self-pay | Admitting: Family Medicine

## 2020-05-09 ENCOUNTER — Ambulatory Visit (INDEPENDENT_AMBULATORY_CARE_PROVIDER_SITE_OTHER): Payer: No Typology Code available for payment source | Admitting: Medical

## 2020-05-09 ENCOUNTER — Other Ambulatory Visit: Payer: Self-pay

## 2020-05-09 ENCOUNTER — Encounter: Payer: Self-pay | Admitting: Medical

## 2020-05-09 VITALS — BP 112/68 | HR 68 | Resp 18 | Ht 67.0 in | Wt 201.0 lb

## 2020-05-09 DIAGNOSIS — Z0000 Encounter for general adult medical examination without abnormal findings: Secondary | ICD-10-CM

## 2020-05-09 NOTE — Progress Notes (Signed)
Subjective:    [name], female    DOB: Aug 07, 1955, 65 y.o.   MRN: [id number]  HPI  Pt in for cpe/wellness.  Jun 25, 2019 will get pap and mammogram.  Pt states she retired past January. Was in marketing and traveled a lot.  Pt does walk daily near beach. Pt states admits not eating healthy.  Pt had recent labs done. Pt had high cholesterol. She has made changes to her diet.  Pt declines flu vaccine and won't get covid vaccine.   Pt states wants no injections today.    Review of Systems  Constitutional: Negative for chills, fatigue and fever.  HENT: Negative for congestion.   Respiratory: Negative for cough, chest tightness, shortness of breath and wheezing.   Cardiovascular: Negative for chest pain and palpitations.  Gastrointestinal: Negative for abdominal pain.  Musculoskeletal: Negative for back pain.  Hematological: Negative for adenopathy.  Psychiatric/Behavioral: Negative for behavioral problems and confusion.    Past Medical History:  Diagnosis Date  . Adenomatous colon polyp   . Allergic rhinitis   . Allergy    late fall, spring   . Anemia    as teenager   . Breast abscess 09/22/2014   recurrent  . Colon polyps   . Depression    pt denies- uses venlafaxine with menopause   . Facial lesion 06/2010   removed from right side of face--precancerous  . GERD (gastroesophageal reflux disease)   . Hypertension   . Hypoglycemia   . Hypoglycemia   . Osteoarthritis    knees neck and hands- OA  . Pain of left heel 02/17/2017  . Preventative health care 03/07/2014  . Snoring 2008   negative sleep study of OSA  . Tendonitis 08/16/2016  . Visual disturbance    visual flashes, referred to [facility] (Normal funduscopic exam)     Social History   Socioeconomic History  . Marital status: Married    Spouse name: Not on file  . Number of children: Not on file  . Years of education: Not on file  . Highest education level: Not on file    Occupational History  . Occupation: Primary school teacher for [employer]  Tobacco Use  . Smoking status: Never Smoker  . Smokeless tobacco: Never Used  Substance and Sexual Activity  . Alcohol use: No  . Drug use: No  . Sexual activity: Yes    Comment: lives with husband, no dietary restrictions  Other Topics Concern  . Not on file  Social History Narrative  . Not on file   Social Determinants of Health   Financial Resource Strain:   . Difficulty of Paying Living Expenses: Not on file  Food Insecurity:   . Worried About Charity fundraiser in the Last Year: Not on file  . Ran Out of Food in the Last Year: Not on file  Transportation Needs:   . Lack of Transportation (Medical): Not on file  . Lack of Transportation (Non-Medical): Not on file  Physical Activity:   . Days of Exercise per Week: Not on file  . Minutes of Exercise per Session: Not on file  Stress:   . Feeling of Stress : Not on file  Social Connections:   . Frequency of Communication with Friends and Family: Not on file  . Frequency of Social Gatherings with Friends and Family: Not on file  . Attends Religious Services: Not on file  . Active Member of Clubs or Organizations: Not  on file  . Attends Archivist Meetings: Not on file  . Marital Status: Not on file  Intimate Partner Violence:   . Fear of Current or Ex-Partner: Not on file  . Emotionally Abused: Not on file  . Physically Abused: Not on file  . Sexually Abused: Not on file    Past Surgical History:  Procedure Laterality Date  . ABDOMINAL HYSTERECTOMY  2002  . CARPAL TUNNEL RELEASE  05/2010, 06/2010   bilateral--Dr Tamera Punt  . CERVICAL FUSION  06/15/09   right sided C6-C7 radiculopathy status post anterior cervical diskectomy and fusion at the C5-6 and C6-7 levels. Phylliss Bob, MD  . CHOLECYSTECTOMY N/A 10/10/2016   Procedure: LAPAROSCOPIC CHOLECYSTECTOMY;  Surgeon: Autumn Messing III, MD;  Location: [facility];  Service: General;   Laterality: N/A;  . COLONOSCOPY    . DORSAL COMPARTMENT RELEASE Left 02/06/2015   Procedure: DEQUERVAIN'S RELEASE;  Surgeon: Milly Jakob, MD;  Location: [place];  Service: Orthopedics;  Laterality: Left;  . FRACTURE SURGERY  08/12/2014 & 02/06/2015   Original break in Feb & add'l surg in Aug  Plate in Left arm  . HARDWARE REMOVAL Left 02/06/2015   Procedure: LEFT WRIST REMOVAL OF HARDWARE;  Surgeon: Milly Jakob, MD;  Location: [place];  Service: Orthopedics;  Laterality: Left;  . Left wrist surgery     Feb.19th 2016  . NASAL SEPTUM SURGERY    . SPINE SURGERY  5 +/- yrs ago   Neck vertebrae fusion  . Surgery on left ankle for fracture      Family History  Problem Relation Age of Onset  . Stroke Mother   . Colon cancer Mother   . Hypertension Mother   . Hyperlipidemia Mother   . Arthritis Mother        OA and rheumatoid  . Heart disease Mother        MI 49  . Colon polyps Mother   . Alcohol abuse Father   . Heart disease Father   . Hypertension Father   . Colon polyps Father   . Arthritis Maternal Grandmother        RA and OA  . Diabetes Maternal Grandmother   . Heart disease Maternal Grandmother   . Hypertension Maternal Grandmother   . Stroke Maternal Grandmother   . Heart disease Maternal Grandfather   . Hypertension Maternal Grandfather   . Diabetes Paternal Grandfather   . Esophageal cancer Neg Hx   . Rectal cancer Neg Hx   . Stomach cancer Neg Hx     No Known Allergies  Current Outpatient Medications on File Prior to Visit  Medication Sig Dispense Refill  . Ascorbic Acid (VITAMIN C) 1000 MG tablet     . Cholecalciferol (VITAMIN D3) 25 MCG (1000 UT) CAPS     . Vitamins-Lipotropics (B-100 COMPLEX) TABS     . aspirin 81 MG EC tablet Take 81 mg by mouth daily.      Marland Kitchen estradiol (CLIMARA - DOSED IN MG/24 HR) 0.025 mg/24hr patch APPLY 1 PATCH TO SKIN ONCE A WEEK 4 patch 3  . fexofenadine (ALLEGRA) 180 MG tablet Take 180 mg by  mouth at bedtime as needed for allergies.     Marland Kitchen gabapentin (NEURONTIN) 300 MG capsule Take 1 capsule (300 mg total) by mouth 2 (two) times daily. 180 capsule 3  . irbesartan-hydrochlorothiazide (AVALIDE) 300-12.5 MG tablet Take 1 tablet by mouth daily. 90 tablet 3  . Krill Oil 350 MG CAPS Take  350 mg by mouth daily.    . Misc Natural Products (GLUCOSAMINE CHONDROITIN TRIPLE) TABS Take 1 tablet by mouth 2 (two) times daily.    . pantoprazole (PROTONIX) 40 MG tablet Take 1 tablet (40 mg total) by mouth daily. 90 tablet 3  . Soy Isoflavones 40 MG TABS Take 40 mg by mouth 2 (two) times daily.     Marland Kitchen venlafaxine XR (EFFEXOR XR) 37.5 MG 24 hr capsule Take 1 tablet by mouth daily for 1 month then take 1 tablet every other day for 1 month, then stop. 45 capsule 0   No current facility-administered medications on file prior to visit.    BP 109/68   Pulse 68   Resp 18   Ht 5\' 7"  (1.702 m)   Wt 201 lb (91.2 kg)   SpO2 98%   BMI 31.48 kg/m       Objective:   Physical Exam  General Mental Status- Alert. General Appearance- Not in acute distress.   Skin General: Color- Normal Color. Moisture- Normal Moisture. Scattered freckles and small moles on back.  Neck Carotid Arteries- Normal color. Moisture- Normal Moisture. No carotid bruits. No JVD.  Chest and Lung Exam Auscultation: Breath Sounds:-Normal.  Cardiovascular Auscultation:Rythm- Regular. Murmurs & Other Heart Sounds:Auscultation of the heart reveals- No Murmurs.  Abdomen Inspection:-Inspeection Normal. Palpation/Percussion:Note:No mass. Palpation and Percussion of the abdomen reveal- Non Tender, Non Distended + BS, no rebound or guarding.    Neurologic Cranial Nerve exam:- CN III-XII intact(No nystagmus), symmetric smile. Strength:- 5/5 equal and symmetric strength both upper and lower extremities.      Assessment & Plan:  For you wellness exam today reviewed labs.  Flu vaccine and covid vaccine  declined.  Recommend exercise and healthy diet.  We will let you know lab results as they come in.  For high cholesterol and triglycerides recommend low processed carbohydrates and low cholesterol diet.    Htn history. Well controlled/lower bp. Continue bp meds. Check bp on occasion particularly if find standing up causes dizziness.   Follow up 3-6 months with pcp. Recommend early morning appointment and come in fasting.  Mackie Pai, PA-C

## 2020-05-09 NOTE — Patient Instructions (Addendum)
For you wellness exam today reviewed labs.  Flu vaccine and covid vaccine declined.  Recommend exercise and healthy diet.  We will let you know lab results as they come in.  For high cholesterol and triglycerides recommend low processed carbohydrates and low cholesterol diet.    Htn history. Well controlled/lower bp. Continue bp meds. Check bp on occasion particularly if find standing up causes dizziness.   Follow up 3-6 months with pcp. Recommend early morning appointment and come in fasting.    Preventive Care 29-30 Years Old, Female Preventive care refers to visits with your health care provider and lifestyle choices that can promote health and wellness. This includes:  A yearly physical exam. This may also be called an annual well check.  Regular dental visits and eye exams.  Immunizations.  Screening for certain conditions.  Healthy lifestyle choices, such as eating a healthy diet, getting regular exercise, not using drugs or products that contain nicotine and tobacco, and limiting alcohol use. What can I expect for my preventive care visit? Physical exam Your health care provider will check your:  Height and weight. This may be used to calculate body mass index (BMI), which tells if you are at a healthy weight.  Heart rate and blood pressure.  Skin for abnormal spots. Counseling Your health care provider may ask you questions about your:  Alcohol, tobacco, and drug use.  Emotional well-being.  Home and relationship well-being.  Sexual activity.  Eating habits.  Work and work Statistician.  Method of birth control.  Menstrual cycle.  Pregnancy history. What immunizations do I need?  Influenza (flu) vaccine  This is recommended every year. Tetanus, diphtheria, and pertussis (Tdap) vaccine  You may need a Td booster every 10 years. Varicella (chickenpox) vaccine  You may need this if you have not been vaccinated. Zoster (shingles) vaccine  You  may need this after age 25. Measles, mumps, and rubella (MMR) vaccine  You may need at least one dose of MMR if you were born in 1957 or later. You may also need a second dose. Pneumococcal conjugate (PCV13) vaccine  You may need this if you have certain conditions and were not previously vaccinated. Pneumococcal polysaccharide (PPSV23) vaccine  You may need one or two doses if you smoke cigarettes or if you have certain conditions. Meningococcal conjugate (MenACWY) vaccine  You may need this if you have certain conditions. Hepatitis A vaccine  You may need this if you have certain conditions or if you travel or work in places where you may be exposed to hepatitis A. Hepatitis B vaccine  You may need this if you have certain conditions or if you travel or work in places where you may be exposed to hepatitis B. Haemophilus influenzae type b (Hib) vaccine  You may need this if you have certain conditions. Human papillomavirus (HPV) vaccine  If recommended by your health care provider, you may need three doses over 6 months. You may receive vaccines as individual doses or as more than one vaccine together in one shot (combination vaccines). Talk with your health care provider about the risks and benefits of combination vaccines. What tests do I need? Blood tests  Lipid and cholesterol levels. These may be checked every 5 years, or more frequently if you are over 21 years old.  Hepatitis C test.  Hepatitis B test. Screening  Lung cancer screening. You may have this screening every year starting at age 65 if you have a 30-pack-year history of smoking and  currently smoke or have quit within the past 15 years.  Colorectal cancer screening. All adults should have this screening starting at age 9 and continuing until age 62. Your health care provider may recommend screening at age 74 if you are at increased risk. You will have tests every 1-10 years, depending on your results and the  type of screening test.  Diabetes screening. This is done by checking your blood sugar (glucose) after you have not eaten for a while (fasting). You may have this done every 1-3 years.  Mammogram. This may be done every 1-2 years. Talk with your health care provider about when you should start having regular mammograms. This may depend on whether you have a family history of breast cancer.  BRCA-related cancer screening. This may be done if you have a family history of breast, ovarian, tubal, or peritoneal cancers.  Pelvic exam and Pap test. This may be done every 3 years starting at age 41. Starting at age 53, this may be done every 5 years if you have a Pap test in combination with an HPV test. Other tests  Sexually transmitted disease (STD) testing.  Bone density scan. This is done to screen for osteoporosis. You may have this scan if you are at high risk for osteoporosis. Follow these instructions at home: Eating and drinking  Eat a diet that includes fresh fruits and vegetables, whole grains, lean protein, and low-fat dairy.  Take vitamin and mineral supplements as recommended by your health care provider.  Do not drink alcohol if: ? Your health care provider tells you not to drink. ? You are pregnant, may be pregnant, or are planning to become pregnant.  If you drink alcohol: ? Limit how much you have to 0-1 drink a day. ? Be aware of how much alcohol is in your drink. In the [place] one drink equals one 12 oz bottle of beer (355 mL), one 5 oz glass of wine (148 mL), or one 1 oz glass of hard liquor (44 mL). Lifestyle  Take daily care of your teeth and gums.  Stay active. Exercise for at least 30 minutes on 5 or more days each week.  Do not use any products that contain nicotine or tobacco, such as cigarettes, e-cigarettes, and chewing tobacco. If you need help quitting, ask your health care provider.  If you are sexually active, practice safe sex. Use a condom or other form  of birth control (contraception) in order to prevent pregnancy and STIs (sexually transmitted infections).  If told by your health care provider, take low-dose aspirin daily starting at age 46. What's next?  Visit your health care provider once a year for a well check visit.  Ask your health care provider how often you should have your eyes and teeth checked.  Stay up to date on all vaccines. This information is not intended to replace advice given to you by your health care provider. Make sure you discuss any questions you have with your health care provider. Document Revised: 02/19/2018 Document Reviewed: 02/19/2018 Elsevier Patient Education  2020 [employer].

## 2020-05-09 NOTE — Telephone Encounter (Signed)
Patient states she would like labs ordered prior to her appointment on Nov 14, 2020 with Dr Charlett Blake.   Please Advise

## 2020-05-10 ENCOUNTER — Other Ambulatory Visit: Payer: Self-pay

## 2020-05-10 DIAGNOSIS — R739 Hyperglycemia, unspecified: Secondary | ICD-10-CM

## 2020-05-10 DIAGNOSIS — E782 Mixed hyperlipidemia: Secondary | ICD-10-CM

## 2020-05-10 DIAGNOSIS — I10 Essential (primary) hypertension: Secondary | ICD-10-CM

## 2020-05-10 NOTE — Telephone Encounter (Signed)
Noted  

## 2020-05-10 NOTE — Telephone Encounter (Signed)
Called pt and sent mychart message for lab location of prefrence

## 2020-08-04 ENCOUNTER — Encounter: Payer: Self-pay | Admitting: Gastroenterology

## 2020-11-07 ENCOUNTER — Other Ambulatory Visit: Payer: No Typology Code available for payment source

## 2020-11-14 ENCOUNTER — Ambulatory Visit: Payer: No Typology Code available for payment source | Admitting: Family Medicine

## 2020-12-05 ENCOUNTER — Other Ambulatory Visit (INDEPENDENT_AMBULATORY_CARE_PROVIDER_SITE_OTHER): Payer: PPO

## 2020-12-05 ENCOUNTER — Other Ambulatory Visit: Payer: Self-pay

## 2020-12-05 DIAGNOSIS — R739 Hyperglycemia, unspecified: Secondary | ICD-10-CM

## 2020-12-05 DIAGNOSIS — E782 Mixed hyperlipidemia: Secondary | ICD-10-CM

## 2020-12-05 DIAGNOSIS — I10 Essential (primary) hypertension: Secondary | ICD-10-CM | POA: Diagnosis not present

## 2020-12-05 LAB — CBC WITH DIFFERENTIAL/PLATELET
Basophils Absolute: 0 10*3/uL (ref 0.0–0.1)
Basophils Relative: 0.7 % (ref 0.0–3.0)
Eosinophils Absolute: 0.2 10*3/uL (ref 0.0–0.7)
Eosinophils Relative: 5.4 % — ABNORMAL HIGH (ref 0.0–5.0)
HCT: 40.2 % (ref 36.0–46.0)
Hemoglobin: 14 g/dL (ref 12.0–15.0)
Lymphocytes Relative: 36.8 % (ref 12.0–46.0)
Lymphs Abs: 1.5 10*3/uL (ref 0.7–4.0)
MCHC: 34.7 g/dL (ref 30.0–36.0)
MCV: 87 fl (ref 78.0–100.0)
Monocytes Absolute: 0.3 10*3/uL (ref 0.1–1.0)
Monocytes Relative: 8.8 % (ref 3.0–12.0)
Neutro Abs: 1.9 10*3/uL (ref 1.4–7.7)
Neutrophils Relative %: 48.3 % (ref 43.0–77.0)
Platelets: 180 10*3/uL (ref 150.0–400.0)
RBC: 4.63 Mil/uL (ref 3.87–5.11)
RDW: 12.7 % (ref 11.5–15.5)
WBC: 4 10*3/uL (ref 4.0–10.5)

## 2020-12-05 LAB — HEMOGLOBIN A1C: Hgb A1c MFr Bld: 5.2 % (ref 4.6–6.5)

## 2020-12-05 LAB — LIPID PANEL
Cholesterol: 164 mg/dL (ref 0–200)
HDL: 43 mg/dL (ref >39.00)
LDL Cholesterol: 92 mg/dL (ref 0–99)
NonHDL: 120.78
Total CHOL/HDL Ratio: 4
Triglycerides: 143 mg/dL (ref 0.0–149.0)
VLDL: 28.6 mg/dL (ref 0.0–40.0)

## 2020-12-12 ENCOUNTER — Other Ambulatory Visit: Payer: Self-pay

## 2020-12-12 ENCOUNTER — Encounter: Payer: Self-pay | Admitting: *Deleted

## 2020-12-12 ENCOUNTER — Ambulatory Visit (INDEPENDENT_AMBULATORY_CARE_PROVIDER_SITE_OTHER): Payer: PPO | Admitting: Family Medicine

## 2020-12-12 VITALS — BP 112/68 | HR 57 | Temp 98.7°F | Resp 16 | Wt 204.0 lb

## 2020-12-12 DIAGNOSIS — K219 Gastro-esophageal reflux disease without esophagitis: Secondary | ICD-10-CM

## 2020-12-12 DIAGNOSIS — Z780 Asymptomatic menopausal state: Secondary | ICD-10-CM | POA: Diagnosis not present

## 2020-12-12 DIAGNOSIS — R739 Hyperglycemia, unspecified: Secondary | ICD-10-CM

## 2020-12-12 DIAGNOSIS — N6452 Nipple discharge: Secondary | ICD-10-CM

## 2020-12-12 DIAGNOSIS — K635 Polyp of colon: Secondary | ICD-10-CM | POA: Diagnosis not present

## 2020-12-12 DIAGNOSIS — I10 Essential (primary) hypertension: Secondary | ICD-10-CM | POA: Diagnosis not present

## 2020-12-12 DIAGNOSIS — E782 Mixed hyperlipidemia: Secondary | ICD-10-CM

## 2020-12-12 DIAGNOSIS — M109 Gout, unspecified: Secondary | ICD-10-CM

## 2020-12-12 DIAGNOSIS — Z23 Encounter for immunization: Secondary | ICD-10-CM

## 2020-12-12 DIAGNOSIS — M791 Myalgia, unspecified site: Secondary | ICD-10-CM | POA: Diagnosis not present

## 2020-12-12 DIAGNOSIS — E6609 Other obesity due to excess calories: Secondary | ICD-10-CM

## 2020-12-12 MED ORDER — IRBESARTAN-HYDROCHLOROTHIAZIDE 300-12.5 MG PO TABS: 1.0000 | ORAL_TABLET | Freq: Every day | ORAL | 3 refills | Status: DC

## 2020-12-12 MED ORDER — GABAPENTIN 300 MG PO CAPS: 300.0000 mg | ORAL_CAPSULE | Freq: Two times a day (BID) | ORAL | 3 refills | Status: DC

## 2020-12-12 MED ORDER — PANTOPRAZOLE SODIUM 40 MG PO TBEC: 40.0000 mg | DELAYED_RELEASE_TABLET | Freq: Every day | ORAL | 3 refills | Status: DC

## 2020-12-12 NOTE — Progress Notes (Signed)
[name], female    DOB: 08/28/1955  Age: 65 y.o. MRN: [id number]    Subjective:  Subjective  HPI Julie Gray presents for office visit today for follow up on HTN and hyperlipidemia. She reports getting a COVID and a pneumonia infection back in January. She states that he had trouble breathing and reports going to [facility] for treatment. She states that she had an Xray of her chest done and have received Zofran for treating nausea. She denies any chest pain, SOB, fever, abdominal pain, cough, chills, sore throat, dysuria, urinary incontinence, back pain, HA, or N/VD. She states that she is doing well on Gabapentin 300 MG and Pantoprazole 40 MG. She reports modifying her diet for her cholesterol by adding tart cherry juice to her diet. She reports getting a cut on her 5th right finger while opening a can and expresses concern regarding her blister that developed in that spot.   Review of Systems  Constitutional:  Negative for chills, fatigue and fever.  HENT:  Negative for congestion, rhinorrhea, sinus pressure, sinus pain and sore throat.   Eyes:  Negative for pain.  Respiratory:  Negative for cough and shortness of breath.   Cardiovascular:  Negative for chest pain, palpitations and leg swelling.  Gastrointestinal:  Negative for abdominal pain, blood in stool, diarrhea, nausea and vomiting.  Genitourinary:  Negative for decreased urine volume, flank pain, frequency, vaginal bleeding and vaginal discharge.  Musculoskeletal:  Positive for arthralgias. Negative for back pain.  Neurological:  Negative for headaches.   History Past Medical History:  Diagnosis Date   Adenomatous colon polyp    Allergic rhinitis    Allergy    late fall, spring    Anemia    as teenager    Breast abscess 09/22/2014   recurrent   Colon polyps    Depression    pt denies- uses venlafaxine with menopause    Facial lesion 06/2010   removed from right side of  face--precancerous   GERD (gastroesophageal reflux disease)    Hypertension    Hypoglycemia    Hypoglycemia    Osteoarthritis    knees neck and hands- OA   Pain of left heel 02/17/2017   Preventative health care 03/07/2014   Snoring 2008   negative sleep study of OSA   Tendonitis 08/16/2016   Visual disturbance    visual flashes, referred to [facility] (Normal funduscopic exam)    She has a past surgical history that includes Abdominal hysterectomy (2002); Cervical fusion (06/15/09); Carpal tunnel release (05/2010, 06/2010); Colonoscopy; Surgery on left ankle for fracture; Left wrist surgery; Dorsal compartment release (Left, 02/06/2015); Hardware Removal (Left, 02/06/2015); Spine surgery (5 +/- yrs ago); Fracture surgery (08/12/2014 & 02/06/2015); Nasal septum surgery; and Cholecystectomy (N/A, 10/10/2016).   Her family history includes Alcohol abuse in her father; Arthritis in her maternal grandmother and mother; Colon cancer in her mother; Colon polyps in her father and mother; Diabetes in her maternal grandmother and paternal grandfather; Heart disease in her father, maternal grandfather, maternal grandmother, and mother; Hyperlipidemia in her mother; Hypertension in her father, maternal grandfather, maternal grandmother, and mother; Stroke in her maternal grandmother and mother.She reports that she has never smoked. She has never used smokeless tobacco. She reports that she does not drink alcohol and does not use drugs.  Current Outpatient Medications on File Prior to Visit  Medication Sig Dispense Refill   Ascorbic Acid (VITAMIN C) 1000 MG tablet  aspirin 81 MG EC tablet Take 81 mg by mouth daily.       Cholecalciferol (VITAMIN D3) 25 MCG (1000 UT) CAPS      fexofenadine (ALLEGRA) 180 MG tablet Take 180 mg by mouth at bedtime as needed for allergies.      Krill Oil 350 MG CAPS Take 350 mg by mouth daily.     Misc Natural Products (GLUCOSAMINE CHONDROITIN TRIPLE) TABS Take 1  tablet by mouth 2 (two) times daily.     Soy Isoflavones 40 MG TABS Take 40 mg by mouth 2 (two) times daily.      Vitamins-Lipotropics (B-100 COMPLEX) TABS      No current facility-administered medications on file prior to visit.     Objective:  Objective  Physical Exam Constitutional:      General: She is not in acute distress.    Appearance: Normal appearance. She is not ill-appearing or toxic-appearing.  HENT:     Head: Normocephalic and atraumatic.     Right Ear: Tympanic membrane, ear canal and external ear normal.     Left Ear: Tympanic membrane, ear canal and external ear normal.     Nose: No congestion or rhinorrhea.  Eyes:     Extraocular Movements: Extraocular movements intact.     Pupils: Pupils are equal, round, and reactive to light.  Cardiovascular:     Rate and Rhythm: Normal rate and regular rhythm.     Pulses: Normal pulses.     Heart sounds: Normal heart sounds. No murmur heard. Pulmonary:     Effort: Pulmonary effort is normal. No respiratory distress.     Breath sounds: Normal breath sounds. No wheezing, rhonchi or rales.  Abdominal:     General: Bowel sounds are normal.     Palpations: Abdomen is soft. There is no mass.     Tenderness: no abdominal tenderness There is no guarding.     Hernia: No hernia is present.  Musculoskeletal:        General: Normal range of motion.     Cervical back: Normal range of motion and neck supple.  Skin:    General: Skin is warm and dry.  Neurological:     Mental Status: She is alert and oriented to person, place, and time.  Psychiatric:        Behavior: Behavior normal.   BP 112/68   Pulse (!) 57   Temp 98.7 F (37.1 C)   Resp 16   Wt 204 lb (92.5 kg)   SpO2 94%   BMI 31.95 kg/m  Wt Readings from Last 3 Encounters:  12/12/20 204 lb (92.5 kg)  05/09/20 201 lb (91.2 kg)  10/18/19 208 lb 9.6 oz (94.6 kg)     Lab Results  Component Value Date   WBC 4.0 12/05/2020   HGB 14.0 12/05/2020   HCT 40.2  12/05/2020   PLT 180.0 12/05/2020   GLUCOSE 97 04/18/2020   CHOL 164 12/05/2020   TRIG 143.0 12/05/2020   HDL 43.00 12/05/2020   LDLDIRECT 115.0 03/15/2019   LDLCALC 92 12/05/2020   ALT 16 04/18/2020   AST 18 04/18/2020   NA 143 04/18/2020   K 3.9 04/18/2020   CL 104 04/18/2020   CREATININE 1.07 (H) 04/18/2020   BUN 16 04/18/2020   CO2 31 04/18/2020   TSH 2.03 04/18/2020   INR 0.97 06/13/2009   HGBA1C 5.2 12/05/2020    No results found.   Assessment & Plan:  Plan    Meds ordered  this encounter  Medications   gabapentin (NEURONTIN) 300 MG capsule    Sig: Take 1 capsule (300 mg total) by mouth 2 (two) times daily.    Dispense:  180 capsule    Refill:  3   pantoprazole (PROTONIX) 40 MG tablet    Sig: Take 1 tablet (40 mg total) by mouth daily.    Dispense:  90 tablet    Refill:  3   irbesartan-hydrochlorothiazide (AVALIDE) 300-12.5 MG tablet    Sig: Take 1 tablet by mouth daily.    Dispense:  90 tablet    Refill:  3    Problem List Items Addressed This Visit     Myalgia   Relevant Medications   gabapentin (NEURONTIN) 300 MG capsule   Essential hypertension    Well controlled, no changes to meds. Encouraged heart healthy diet such as the DASH diet and exercise as tolerated.        Relevant Medications   gabapentin (NEURONTIN) 300 MG capsule   irbesartan-hydrochlorothiazide (AVALIDE) 300-12.5 MG tablet   GERD (gastroesophageal reflux disease)    Avoid offending foods, start probiotics. Do not eat large meals in late evening and consider raising head of bed. Refill given on PPI       Relevant Medications   pantoprazole (PROTONIX) 40 MG tablet   Hyperlipidemia, mixed    Encourage heart healthy diet such as MIND or DASH diet, increase exercise, avoid trans fats, simple carbohydrates and processed foods, consider a krill or fish or flaxseed oil cap daily.        Relevant Medications   irbesartan-hydrochlorothiazide (AVALIDE) 300-12.5 MG tablet   Obesity     Encouraged DASH or MIND diet, decrease po intake and increase exercise as tolerated. Needs 7-8 hours of sleep nightly. Avoid trans fats, eat small, frequent meals every 4-5 hours with lean proteins, complex carbs and healthy fats. Minimize simple carbs, high fat foods and processed foods       Post-menopause   Relevant Medications   gabapentin (NEURONTIN) 300 MG capsule   Hyperglycemia    hgba1c acceptable, minimize simple carbs. Increase exercise as tolerated.       Discharge from breast   Polyp of colon - Primary    Referred back to gastroenterology for repeat colonoscopy. Patient is not noting any change in bowel habits       Relevant Orders   Ambulatory referral to Gastroenterology   Gout    Patient notes no flares since starting tart cherry juice        Follow-up: Return in about 7 months (around 07/16/2021), or lab appt on Tuesday any given week in am followed by CPE on that Thursday am, for annual exam.   I,David Hanna,acting as a scribe for Penni Homans, MD.,have documented all relevant documentation on the behalf of Penni Homans, MD,as directed by  Penni Homans, MD while in the presence of Penni Homans, MD.  I, Mosie Lukes, MD personally performed the services described in this documentation. All medical record entries made by the scribe were at my direction and in my presence. I have reviewed the chart and agree that the record reflects my personal performance and is accurate and complete

## 2020-12-12 NOTE — Assessment & Plan Note (Signed)
hgba1c acceptable, minimize simple carbs. Increase exercise as tolerated.  

## 2020-12-12 NOTE — Patient Instructions (Signed)
Fatty Liver Disease  The liver converts food into energy, removes toxic material from the blood, makes important proteins, and absorbs necessary vitamins from food. Fatty liver disease occurs when too much fat has built up in your liver cells. Fatty liverdisease is also called hepatic steatosis. In many cases, fatty liver disease does not cause symptoms or problems. It is often diagnosed when tests are being done for other reasons. However, over time, fatty liver can cause inflammation that may lead to more serious liver problems, such as scarring of the liver (cirrhosis) and liver failure. Fatty liver is associated with insulin resistance, increased body fat, high blood pressure (hypertension), and high cholesterol. These are features of metabolic syndrome and increaseyour risk for stroke, diabetes, and heart disease. What are the causes? This condition may be caused by components of metabolic syndrome: Obesity. Insulin resistance. High cholesterol. Other causes: Alcohol abuse. Poor nutrition. Cushing syndrome. Pregnancy. Certain drugs. Poisons. Some viral infections. What increases the risk? You are more likely to develop this condition if you: Abuse alcohol. Are overweight. Have diabetes. Have hepatitis. Have a high triglyceride level. Are pregnant. What are the signs or symptoms? Fatty liver disease often does not cause symptoms. If symptoms do develop, they can include: Fatigue and weakness. Weight loss. Confusion. Nausea, vomiting, or abdominal pain. Yellowing of your skin and the white parts of your eyes (jaundice). Itchy skin. How is this diagnosed? This condition may be diagnosed by: A physical exam and your medical history. Blood tests. Imaging tests, such as an ultrasound, CT scan, or MRI. A liver biopsy. A small sample of liver tissue is removed using a needle. The sample is then looked at under a microscope. How is this treated? Fatty liver disease is often  caused by other health conditions. Treatment for fatty liver may involve medicines and lifestyle changes to manage conditions such as: Alcoholism. High cholesterol. Diabetes. Being overweight or obese. Follow these instructions at home:  Do not drink alcohol. If you have trouble quitting, ask your health care provider how to safely quit with the help of medicine or a supervised program. This is important to keep your condition from getting worse. Eat a healthy diet as told by your health care provider. Ask your health care provider about working with a dietitian to develop an eating plan. Exercise regularly. This can help you lose weight and control your cholesterol and diabetes. Talk to your health care provider about an exercise plan and which activities are best for you. Take over-the-counter and prescription medicines only as told by your health care provider. Keep all follow-up visits. This is important. Contact a health care provider if: You have trouble controlling your: Blood sugar. This is especially important if you have diabetes. Cholesterol. Drinking of alcohol. Get help right away if: You have abdominal pain. You have jaundice. You have nausea and are vomiting. You vomit blood or material that looks like coffee grounds. You have stools that are black, tar-like, or bloody. Summary Fatty liver disease develops when too much fat builds up in the cells of your liver. Fatty liver disease often causes no symptoms or problems. However, over time, fatty liver can cause inflammation that may lead to more serious liver problems, such as scarring of the liver (cirrhosis). You are more likely to develop this condition if you abuse alcohol, are pregnant, are overweight, have diabetes, have hepatitis, or have high triglyceride or cholesterol levels. Contact your health care provider if you have trouble controlling your blood sugar, cholesterol,   or drinking of alcohol. This information is  not intended to replace advice given to you by your health care provider. Make sure you discuss any questions you have with your healthcare provider. Document Revised: 03/23/2020 Document Reviewed: 03/23/2020 Elsevier Patient Education  2022 Elsevier Inc.  

## 2020-12-13 DIAGNOSIS — K635 Polyp of colon: Secondary | ICD-10-CM | POA: Insufficient documentation

## 2020-12-13 DIAGNOSIS — M109 Gout, unspecified: Secondary | ICD-10-CM | POA: Insufficient documentation

## 2020-12-13 NOTE — Assessment & Plan Note (Signed)
Referred back to gastroenterology for repeat colonoscopy. Patient is not noting any change in bowel habits

## 2020-12-13 NOTE — Assessment & Plan Note (Signed)
Avoid offending foods, start probiotics. Do not eat large meals in late evening and consider raising head of bed. Refill given on PPI

## 2020-12-13 NOTE — Assessment & Plan Note (Signed)
Well controlled, no changes to meds. Encouraged heart healthy diet such as the DASH diet and exercise as tolerated.  °

## 2020-12-13 NOTE — Assessment & Plan Note (Signed)
Encouraged DASH or MIND diet, decrease po intake and increase exercise as tolerated. Needs 7-8 hours of sleep nightly. Avoid trans fats, eat small, frequent meals every 4-5 hours with lean proteins, complex carbs and healthy fats. Minimize simple carbs, high fat foods and processed foods 

## 2020-12-13 NOTE — Assessment & Plan Note (Signed)
Patient notes no flares since starting tart cherry juice

## 2020-12-13 NOTE — Assessment & Plan Note (Signed)
Encourage heart healthy diet such as MIND or DASH diet, increase exercise, avoid trans fats, simple carbohydrates and processed foods, consider a krill or fish or flaxseed oil cap daily.  °

## 2020-12-14 DIAGNOSIS — H25043 Posterior subcapsular polar age-related cataract, bilateral: Secondary | ICD-10-CM | POA: Diagnosis not present

## 2020-12-14 DIAGNOSIS — H18413 Arcus senilis, bilateral: Secondary | ICD-10-CM | POA: Diagnosis not present

## 2020-12-14 DIAGNOSIS — H2513 Age-related nuclear cataract, bilateral: Secondary | ICD-10-CM | POA: Diagnosis not present

## 2020-12-14 DIAGNOSIS — H2512 Age-related nuclear cataract, left eye: Secondary | ICD-10-CM | POA: Diagnosis not present

## 2020-12-14 DIAGNOSIS — H35372 Puckering of macula, left eye: Secondary | ICD-10-CM | POA: Diagnosis not present

## 2020-12-14 DIAGNOSIS — H25013 Cortical age-related cataract, bilateral: Secondary | ICD-10-CM | POA: Diagnosis not present

## 2021-01-31 DIAGNOSIS — H2512 Age-related nuclear cataract, left eye: Secondary | ICD-10-CM | POA: Diagnosis not present

## 2021-02-01 DIAGNOSIS — H2511 Age-related nuclear cataract, right eye: Secondary | ICD-10-CM | POA: Diagnosis not present

## 2021-02-14 DIAGNOSIS — H2511 Age-related nuclear cataract, right eye: Secondary | ICD-10-CM | POA: Diagnosis not present

## 2021-04-11 ENCOUNTER — Ambulatory Visit
Admission: RE | Admit: 2021-04-11 | Discharge: 2021-04-11 | Disposition: A | Discharge status: Home / Self Care | Payer: PPO | Source: Ambulatory Visit | Attending: Orthopaedic Surgery | Admitting: Orthopaedic Surgery

## 2021-04-11 ENCOUNTER — Other Ambulatory Visit: Payer: Self-pay | Admitting: Orthopaedic Surgery

## 2021-04-11 DIAGNOSIS — M1990 Unspecified osteoarthritis, unspecified site: Secondary | ICD-10-CM

## 2021-04-11 DIAGNOSIS — M19072 Primary osteoarthritis, left ankle and foot: Secondary | ICD-10-CM | POA: Diagnosis not present

## 2021-04-26 DIAGNOSIS — G8918 Other acute postprocedural pain: Secondary | ICD-10-CM | POA: Diagnosis not present

## 2021-04-26 DIAGNOSIS — M19072 Primary osteoarthritis, left ankle and foot: Secondary | ICD-10-CM | POA: Diagnosis not present

## 2021-05-11 DIAGNOSIS — M19072 Primary osteoarthritis, left ankle and foot: Secondary | ICD-10-CM | POA: Diagnosis not present

## 2021-06-07 ENCOUNTER — Other Ambulatory Visit: Payer: Self-pay | Admitting: Family Medicine

## 2021-06-07 DIAGNOSIS — Z1231 Encounter for screening mammogram for malignant neoplasm of breast: Secondary | ICD-10-CM

## 2021-06-13 DIAGNOSIS — M19072 Primary osteoarthritis, left ankle and foot: Secondary | ICD-10-CM | POA: Diagnosis not present

## 2021-06-13 DIAGNOSIS — Z9889 Other specified postprocedural states: Secondary | ICD-10-CM | POA: Diagnosis not present

## 2021-06-25 DIAGNOSIS — R030 Elevated blood-pressure reading, without diagnosis of hypertension: Secondary | ICD-10-CM | POA: Diagnosis not present

## 2021-06-25 DIAGNOSIS — E669 Obesity, unspecified: Secondary | ICD-10-CM | POA: Diagnosis not present

## 2021-06-25 DIAGNOSIS — Z6835 Body mass index (BMI) 35.0-35.9, adult: Secondary | ICD-10-CM | POA: Diagnosis not present

## 2021-06-25 DIAGNOSIS — J019 Acute sinusitis, unspecified: Secondary | ICD-10-CM | POA: Diagnosis not present

## 2021-07-13 ENCOUNTER — Telehealth: Payer: Self-pay | Admitting: *Deleted

## 2021-07-13 NOTE — Telephone Encounter (Signed)
Pt has lab appointment on Tuesday but no future orders in Epic.  Please place future orders if appropriate or call pt to cancel if labs not needed at this time. Pt also has follow up with PCP on Thursday.

## 2021-07-16 ENCOUNTER — Other Ambulatory Visit: Payer: Self-pay

## 2021-07-16 DIAGNOSIS — E782 Mixed hyperlipidemia: Secondary | ICD-10-CM

## 2021-07-16 DIAGNOSIS — I10 Essential (primary) hypertension: Secondary | ICD-10-CM

## 2021-07-16 DIAGNOSIS — R739 Hyperglycemia, unspecified: Secondary | ICD-10-CM

## 2021-07-16 NOTE — Telephone Encounter (Signed)
done

## 2021-07-17 ENCOUNTER — Other Ambulatory Visit (INDEPENDENT_AMBULATORY_CARE_PROVIDER_SITE_OTHER): Payer: PRIVATE HEALTH INSURANCE

## 2021-07-17 ENCOUNTER — Ambulatory Visit
Admission: RE | Admit: 2021-07-17 | Discharge: 2021-07-17 | Disposition: A | Discharge status: Home / Self Care | Payer: PRIVATE HEALTH INSURANCE | Source: Ambulatory Visit | Attending: Family Medicine | Admitting: Family Medicine

## 2021-07-17 DIAGNOSIS — R739 Hyperglycemia, unspecified: Secondary | ICD-10-CM | POA: Diagnosis not present

## 2021-07-17 DIAGNOSIS — E782 Mixed hyperlipidemia: Secondary | ICD-10-CM

## 2021-07-17 DIAGNOSIS — Z1231 Encounter for screening mammogram for malignant neoplasm of breast: Secondary | ICD-10-CM | POA: Diagnosis not present

## 2021-07-17 DIAGNOSIS — I10 Essential (primary) hypertension: Secondary | ICD-10-CM

## 2021-07-17 LAB — CBC WITH DIFFERENTIAL/PLATELET
Basophils Absolute: 0 10*3/uL (ref 0.0–0.1)
Basophils Relative: 0.5 % (ref 0.0–3.0)
Eosinophils Absolute: 0.1 10*3/uL (ref 0.0–0.7)
Eosinophils Relative: 3.1 % (ref 0.0–5.0)
HCT: 41.8 % (ref 36.0–46.0)
Hemoglobin: 14 g/dL (ref 12.0–15.0)
Lymphocytes Relative: 32.4 % (ref 12.0–46.0)
Lymphs Abs: 1.5 10*3/uL (ref 0.7–4.0)
MCHC: 33.4 g/dL (ref 30.0–36.0)
MCV: 89.8 fl (ref 78.0–100.0)
Monocytes Absolute: 0.3 10*3/uL (ref 0.1–1.0)
Monocytes Relative: 7.6 % (ref 3.0–12.0)
Neutro Abs: 2.6 10*3/uL (ref 1.4–7.7)
Neutrophils Relative %: 56.4 % (ref 43.0–77.0)
Platelets: 182 10*3/uL (ref 150.0–400.0)
RBC: 4.66 Mil/uL (ref 3.87–5.11)
RDW: 13.1 % (ref 11.5–15.5)
WBC: 4.6 10*3/uL (ref 4.0–10.5)

## 2021-07-17 LAB — COMPREHENSIVE METABOLIC PANEL
ALT: 22 U/L (ref 0–35)
AST: 19 U/L (ref 0–37)
Albumin: 4.2 g/dL (ref 3.5–5.2)
Alkaline Phosphatase: 114 U/L (ref 39–117)
BUN: 20 mg/dL (ref 6–23)
CO2: 32 mEq/L (ref 19–32)
Calcium: 9.5 mg/dL (ref 8.4–10.5)
Chloride: 103 mEq/L (ref 96–112)
Creatinine, Ser: 0.99 mg/dL (ref 0.40–1.20)
GFR: 59.81 mL/min — ABNORMAL LOW (ref >60.00)
Glucose, Bld: 95 mg/dL (ref 70–99)
Potassium: 3.8 mEq/L (ref 3.5–5.1)
Sodium: 143 mEq/L (ref 135–145)
Total Bilirubin: 0.5 mg/dL (ref 0.2–1.2)
Total Protein: 6.6 g/dL (ref 6.0–8.3)

## 2021-07-17 LAB — LIPID PANEL
Cholesterol: 188 mg/dL (ref 0–200)
HDL: 51.4 mg/dL (ref >39.00)
LDL Cholesterol: 104 mg/dL — ABNORMAL HIGH (ref 0–99)
NonHDL: 136.22
Total CHOL/HDL Ratio: 4
Triglycerides: 162 mg/dL — ABNORMAL HIGH (ref 0.0–149.0)
VLDL: 32.4 mg/dL (ref 0.0–40.0)

## 2021-07-17 LAB — TSH: TSH: 2.82 u[IU]/mL (ref 0.35–5.50)

## 2021-07-17 LAB — HEMOGLOBIN A1C: Hgb A1c MFr Bld: 5 % (ref 4.6–6.5)

## 2021-07-18 DIAGNOSIS — M19072 Primary osteoarthritis, left ankle and foot: Secondary | ICD-10-CM | POA: Diagnosis not present

## 2021-07-18 DIAGNOSIS — Z9889 Other specified postprocedural states: Secondary | ICD-10-CM | POA: Diagnosis not present

## 2021-07-19 ENCOUNTER — Ambulatory Visit (INDEPENDENT_AMBULATORY_CARE_PROVIDER_SITE_OTHER): Payer: PRIVATE HEALTH INSURANCE | Admitting: Family Medicine

## 2021-07-19 ENCOUNTER — Telehealth: Payer: Self-pay | Admitting: Family Medicine

## 2021-07-19 ENCOUNTER — Encounter: Payer: Self-pay | Admitting: Family Medicine

## 2021-07-19 ENCOUNTER — Encounter: Payer: Self-pay | Admitting: Gastroenterology

## 2021-07-19 VITALS — BP 110/68 | HR 86 | Temp 98.1°F | Resp 16 | Ht 67.0 in | Wt 207.6 lb

## 2021-07-19 DIAGNOSIS — Z780 Asymptomatic menopausal state: Secondary | ICD-10-CM | POA: Diagnosis not present

## 2021-07-19 DIAGNOSIS — Z6835 Body mass index (BMI) 35.0-35.9, adult: Secondary | ICD-10-CM | POA: Diagnosis not present

## 2021-07-19 DIAGNOSIS — Z0001 Encounter for general adult medical examination with abnormal findings: Secondary | ICD-10-CM

## 2021-07-19 DIAGNOSIS — E2839 Other primary ovarian failure: Secondary | ICD-10-CM

## 2021-07-19 DIAGNOSIS — E782 Mixed hyperlipidemia: Secondary | ICD-10-CM

## 2021-07-19 DIAGNOSIS — I10 Essential (primary) hypertension: Secondary | ICD-10-CM

## 2021-07-19 DIAGNOSIS — M109 Gout, unspecified: Secondary | ICD-10-CM

## 2021-07-19 DIAGNOSIS — E6609 Other obesity due to excess calories: Secondary | ICD-10-CM

## 2021-07-19 DIAGNOSIS — Z124 Encounter for screening for malignant neoplasm of cervix: Secondary | ICD-10-CM | POA: Diagnosis not present

## 2021-07-19 DIAGNOSIS — K635 Polyp of colon: Secondary | ICD-10-CM

## 2021-07-19 DIAGNOSIS — Z01419 Encounter for gynecological examination (general) (routine) without abnormal findings: Secondary | ICD-10-CM | POA: Diagnosis not present

## 2021-07-19 DIAGNOSIS — Z0000 Encounter for general adult medical examination without abnormal findings: Secondary | ICD-10-CM

## 2021-07-19 DIAGNOSIS — R739 Hyperglycemia, unspecified: Secondary | ICD-10-CM

## 2021-07-19 LAB — HM PAP SMEAR

## 2021-07-19 NOTE — Assessment & Plan Note (Signed)
Patient encouraged to maintain heart healthy diet, regular exercise, adequate sleep. Consider daily probiotics. Take medications as prescribed. Labs reviewed. Colonoscopy in March 2023. MGM was normal in January 2023. Will schedule Dexa. Declines COVID shots. Encouraged Tdap. Dr Stann Mainland of GYN at [facility] is seeing her today at 3 pm for pelvic and pap.

## 2021-07-19 NOTE — Assessment & Plan Note (Signed)
hgba1c acceptable, minimize simple carbs. Increase exercise as tolerated. Continue current meds 

## 2021-07-19 NOTE — Assessment & Plan Note (Signed)
Hydrate and monitor 

## 2021-07-19 NOTE — Assessment & Plan Note (Signed)
Has colonoscopy in March 2023

## 2021-07-19 NOTE — Progress Notes (Signed)
Subjective:    [name], female    DOB: 1955/12/02, 66 y.o.   MRN: [id number]  Chief Complaint  Patient presents with   Annual Exam    HPI Patient is in today for annual preventative exam and follow up on chronic medical concerns. No recent febrile illness or hospitalizations. Refuses COVID shots did have Covid last year. She had pneumonia at that time but is doing fine now. She had a sinus infection in December 2022 and was given Augmentin on 06/25/2021 and she responded well. Feels clear now. She had left foot operated on in November for bad arthritis and she had a plate and a screws placed and it still hurts some still but improving. She declines Tdap but is aware if she gets hurt to take one. Denies CP/palp/SOB/HA/congestion/fevers/GI or GU c/o. Taking meds as prescribed. [place] to be more active and eat well. Now that she is retired she feels less stressed and is sleeping well.   Past Medical History:  Diagnosis Date   Adenomatous colon polyp    Allergic rhinitis    Allergy    late fall, spring    Anemia    as teenager    Breast abscess 09/22/2014   recurrent   Colon polyps    Depression    pt denies- uses venlafaxine with menopause    Facial lesion 06/2010   removed from right side of face--precancerous   GERD (gastroesophageal reflux disease)    Hypertension    Hypoglycemia    Hypoglycemia    Osteoarthritis    knees neck and hands- OA   Pain of left heel 02/17/2017   Preventative health care 03/07/2014   Snoring 2008   negative sleep study of OSA   Tendonitis 08/16/2016   Visual disturbance    visual flashes, referred to [facility] (Normal funduscopic exam)    Past Surgical History:  Procedure Laterality Date   ABDOMINAL HYSTERECTOMY  2002   CARPAL TUNNEL RELEASE  05/2010, 06/2010   bilateral--Dr Ellis  06/15/09   right sided C6-C7 radiculopathy status post anterior cervical  diskectomy and fusion at the C5-6 and C6-7 levels. Phylliss Bob, MD   CHOLECYSTECTOMY N/A 10/10/2016   Procedure: LAPAROSCOPIC CHOLECYSTECTOMY;  Surgeon: Autumn Messing III, MD;  Location: [facility];  Service: General;  Laterality: N/A;   COLONOSCOPY     DORSAL COMPARTMENT RELEASE Left 02/06/2015   Procedure: DEQUERVAIN'S RELEASE;  Surgeon: Milly Jakob, MD;  Location: [place];  Service: Orthopedics;  Laterality: Left;   FRACTURE SURGERY  08/12/2014 & 02/06/2015   Original break in Feb & add'l surg in Aug  Plate in Left arm   HARDWARE REMOVAL Left 02/06/2015   Procedure: LEFT WRIST REMOVAL OF HARDWARE;  Surgeon: Milly Jakob, MD;  Location: [place];  Service: Orthopedics;  Laterality: Left;   Left wrist surgery     Feb.19th 2016   NASAL SEPTUM SURGERY     SPINE SURGERY  5 +/- yrs ago   Neck vertebrae fusion   Surgery on left ankle for fracture      Family History  Problem Relation Age of Onset   Stroke Mother    Colon cancer Mother    Hypertension Mother    Hyperlipidemia Mother    Arthritis Mother        OA and rheumatoid   Heart disease Mother  MI 19   Colon polyps Mother    Alcohol abuse Father    Heart disease Father    Hypertension Father    Colon polyps Father    Arthritis Maternal Grandmother        RA and OA   Diabetes Maternal Grandmother    Heart disease Maternal Grandmother    Hypertension Maternal Grandmother    Stroke Maternal Grandmother    Heart disease Maternal Grandfather    Hypertension Maternal Grandfather    Diabetes Paternal Grandfather    Esophageal cancer Neg Hx    Rectal cancer Neg Hx    Stomach cancer Neg Hx     Social History   Socioeconomic History   Marital status: Married    Spouse name: Not on file   Number of children: Not on file   Years of education: Not on file   Highest education level: Not on file  Occupational History   Occupation: Primary school teacher for [employer]  Use   Smoking status: Never   Smokeless tobacco: Never  Substance and Sexual Activity   Alcohol use: No   Drug use: No   Sexual activity: Yes    Comment: lives with husband, no dietary restrictions  Other Topics Concern   Not on file  Social History Narrative   Not on file   Social Determinants of Health   Financial Resource Strain: Not on file  Food Insecurity: Not on file  Transportation Needs: Not on file  Physical Activity: Not on file  Stress: Not on file  Social Connections: Not on file  Intimate Partner Violence: Not on file    Outpatient Medications Prior to Visit  Medication Sig Dispense Refill   Ascorbic Acid (VITAMIN C) 1000 MG tablet      aspirin 81 MG EC tablet Take 81 mg by mouth daily.       Cholecalciferol (VITAMIN D3) 25 MCG (1000 UT) CAPS      fexofenadine (ALLEGRA) 180 MG tablet Take 180 mg by mouth at bedtime as needed for allergies.      gabapentin (NEURONTIN) 300 MG capsule Take 1 capsule (300 mg total) by mouth 2 (two) times daily. 180 capsule 3   irbesartan-hydrochlorothiazide (AVALIDE) 300-12.5 MG tablet Take 1 tablet by mouth daily. 90 tablet 3   Krill Oil 350 MG CAPS Take 350 mg by mouth daily.     Misc Natural Products (GLUCOSAMINE CHONDROITIN TRIPLE) TABS Take 1 tablet by mouth 2 (two) times daily.     pantoprazole (PROTONIX) 40 MG tablet Take 1 tablet (40 mg total) by mouth daily. 90 tablet 3   Soy Isoflavones 40 MG TABS Take 40 mg by mouth 2 (two) times daily.      Vitamins-Lipotropics (B-100 COMPLEX) TABS      No facility-administered medications prior to visit.    No Known Allergies  Review of Systems  Constitutional:  Negative for chills, fever and malaise/fatigue.  HENT:  Negative for congestion and hearing loss.   Eyes:  Negative for blurred vision and discharge.  Respiratory:  Negative for cough, sputum production and shortness of breath.   Cardiovascular:  Negative for chest pain, palpitations and leg swelling.  Gastrointestinal:   Negative for abdominal pain, blood in stool, constipation, diarrhea, heartburn, nausea and vomiting.  Genitourinary:  Negative for dysuria, frequency, hematuria and urgency.  Musculoskeletal:  Positive for joint pain. Negative for back pain, falls and myalgias.  Skin:  Negative for rash.  Neurological:  Negative for dizziness, sensory  change, loss of consciousness, weakness and headaches.  Endo/Heme/Allergies:  Negative for environmental allergies. Does not bruise/bleed easily.  Psychiatric/Behavioral:  Negative for depression and suicidal ideas. The patient is not nervous/anxious and does not have insomnia.       Objective:    Physical Exam Constitutional:      General: She is not in acute distress.    Appearance: She is well-developed.  HENT:     Head: Normocephalic and atraumatic.  Eyes:     Conjunctiva/sclera: Conjunctivae normal.  Neck:     Thyroid: No thyromegaly.  Cardiovascular:     Rate and Rhythm: Normal rate and regular rhythm.     Heart sounds: Normal heart sounds. No murmur heard. Pulmonary:     Effort: Pulmonary effort is normal. No respiratory distress.     Breath sounds: Normal breath sounds.  Abdominal:     General: Bowel sounds are normal. There is no distension.     Palpations: Abdomen is soft. There is no mass.     Tenderness: There is no abdominal tenderness.  Musculoskeletal:     Cervical back: Neck supple.  Lymphadenopathy:     Cervical: No cervical adenopathy.  Skin:    General: Skin is warm and dry.  Neurological:     Mental Status: She is alert and oriented to person, place, and time.  Psychiatric:        Behavior: Behavior normal.    BP 110/68    Pulse 86    Temp 98.1 F (36.7 C)    Resp 16    Ht 5\' 7"  (1.702 m)    Wt 207 lb 9.6 oz (94.2 kg)    SpO2 95%    BMI 32.51 kg/m  Wt Readings from Last 3 Encounters:  07/19/21 207 lb 9.6 oz (94.2 kg)  12/12/20 204 lb (92.5 kg)  05/09/20 201 lb (91.2 kg)    Diabetic Foot Exam - Simple   No data  filed    Lab Results  Component Value Date   WBC 4.6 07/17/2021   HGB 14.0 07/17/2021   HCT 41.8 07/17/2021   PLT 182.0 07/17/2021   GLUCOSE 95 07/17/2021   CHOL 188 07/17/2021   TRIG 162.0 (H) 07/17/2021   HDL 51.40 07/17/2021   LDLDIRECT 115.0 03/15/2019   LDLCALC 104 (H) 07/17/2021   ALT 22 07/17/2021   AST 19 07/17/2021   NA 143 07/17/2021   K 3.8 07/17/2021   CL 103 07/17/2021   CREATININE 0.99 07/17/2021   BUN 20 07/17/2021   CO2 32 07/17/2021   TSH 2.82 07/17/2021   INR 0.97 06/13/2009   HGBA1C 5.0 07/17/2021    Lab Results  Component Value Date   TSH 2.82 07/17/2021   Lab Results  Component Value Date   WBC 4.6 07/17/2021   HGB 14.0 07/17/2021   HCT 41.8 07/17/2021   MCV 89.8 07/17/2021   PLT 182.0 07/17/2021   Lab Results  Component Value Date   NA 143 07/17/2021   K 3.8 07/17/2021   CO2 32 07/17/2021   GLUCOSE 95 07/17/2021   BUN 20 07/17/2021   CREATININE 0.99 07/17/2021   BILITOT 0.5 07/17/2021   ALKPHOS 114 07/17/2021   AST 19 07/17/2021   ALT 22 07/17/2021   PROT 6.6 07/17/2021   ALBUMIN 4.2 07/17/2021   CALCIUM 9.5 07/17/2021   ANIONGAP 9 10/09/2016   GFR 59.81 (L) 07/17/2021   Lab Results  Component Value Date   CHOL 188 07/17/2021   Lab Results  Component Value Date   HDL 51.40 07/17/2021   Lab Results  Component Value Date   LDLCALC 104 (H) 07/17/2021   Lab Results  Component Value Date   TRIG 162.0 (H) 07/17/2021   Lab Results  Component Value Date   CHOLHDL 4 07/17/2021   Lab Results  Component Value Date   HGBA1C 5.0 07/17/2021       Assessment & Plan:   Problem List Items Addressed This Visit     Hyperlipidemia, mixed    Encourage heart healthy diet such as MIND or DASH diet, increase exercise, avoid trans fats, simple carbohydrates and processed foods, consider a krill or fish or flaxseed oil cap daily.       Obesity    Encouraged DASH or MIND diet, decrease po intake and increase exercise as  tolerated. Needs 7-8 hours of sleep nightly. Avoid trans fats, eat small, frequent meals every 4-5 hours with lean proteins, complex carbs and healthy fats. Minimize simple carbs, high fat foods and processed foods      Preventative health care    Patient encouraged to maintain heart healthy diet, regular exercise, adequate sleep. Consider daily probiotics. Take medications as prescribed. Labs reviewed. Colonoscopy in March 2023. MGM was normal in January 2023. Will schedule Dexa. Declines COVID shots. Encouraged Tdap. Dr Stann Mainland of GYN at [facility] is seeing her today at 3 pm for pelvic and pap.      Hyperglycemia    hgba1c acceptable, minimize simple carbs. Increase exercise as tolerated. Continue current meds      Gout    Hydrate and monitor      Other Visit Diagnoses     Estrogen deficiency    -  Primary   Relevant Orders   DG Bone Density   Post-menopausal       Relevant Orders   DG Bone Density       I am having Ranae Palms. Strider "Cindy" maintain her fexofenadine, Soy Isoflavones, aspirin, Krill Oil, Glucosamine Chondroitin Triple, vitamin C, Vitamin D3, B-100 Complex, gabapentin, pantoprazole, and irbesartan-hydrochlorothiazide.  No orders of the defined types were placed in this encounter.    Penni Homans, MD

## 2021-07-19 NOTE — Patient Instructions (Signed)
Preventive Care 65 Years and Older, Female °Preventive care refers to lifestyle choices and visits with your health care provider that can promote health and wellness. Preventive care visits are also called wellness exams. °What can I expect for my preventive care visit? °Counseling °Your health care provider may ask you questions about your: °Medical history, including: °Past medical problems. °Family medical history. °Pregnancy and menstrual history. °History of falls. °Current health, including: °Memory and ability to understand (cognition). °Emotional well-being. °Home life and relationship well-being. °Sexual activity and sexual health. °Lifestyle, including: °Alcohol, nicotine or tobacco, and drug use. °Access to firearms. °Diet, exercise, and sleep habits. °Work and work environment. °Sunscreen use. °Safety issues such as seatbelt and bike helmet use. °Physical exam °Your health care provider will check your: °Height and weight. These may be used to calculate your BMI (body mass index). BMI is a measurement that tells if you are at a healthy weight. °Waist circumference. This measures the distance around your waistline. This measurement also tells if you are at a healthy weight and may help predict your risk of certain diseases, such as type 2 diabetes and high blood pressure. °Heart rate and blood pressure. °Body temperature. °Skin for abnormal spots. °What immunizations do I need? °Vaccines are usually given at various ages, according to a schedule. Your health care provider will recommend vaccines for you based on your age, medical history, and lifestyle or other factors, such as travel or where you work. °What tests do I need? °Screening °Your health care provider may recommend screening tests for certain conditions. This may include: °Lipid and cholesterol levels. °Hepatitis C test. °Hepatitis B test. °HIV (human immunodeficiency virus) test. °STI (sexually transmitted infection) testing, if you are at  risk. °Lung cancer screening. °Colorectal cancer screening. °Diabetes screening. This is done by checking your blood sugar (glucose) after you have not eaten for a while (fasting). °Mammogram. Talk with your health care provider about how often you should have regular mammograms. °BRCA-related cancer screening. This may be done if you have a family history of breast, ovarian, tubal, or peritoneal cancers. °Bone density scan. This is done to screen for osteoporosis. °Talk with your health care provider about your test results, treatment options, and if necessary, the need for more tests. °Follow these instructions at home: °Eating and drinking ° °Eat a diet that includes fresh fruits and vegetables, whole grains, lean protein, and low-fat dairy products. Limit your intake of foods with high amounts of sugar, saturated fats, and salt. °Take vitamin and mineral supplements as recommended by your health care provider. °Do not drink alcohol if your health care provider tells you not to drink. °If you drink alcohol: °Limit how much you have to 0-1 drink a day. °Know how much alcohol is in your drink. In the [place] one drink equals one 12 oz bottle of beer (355 mL), one 5 oz glass of wine (148 mL), or one 1½ oz glass of hard liquor (44 mL). °Lifestyle °Brush your teeth every morning and night with fluoride toothpaste. Floss one time each day. °Exercise for at least 30 minutes 5 or more days each week. °Do not use any products that contain nicotine or tobacco. These products include cigarettes, chewing tobacco, and vaping devices, such as e-cigarettes. If you need help quitting, ask your health care provider. °Do not use drugs. °If you are sexually active, practice safe sex. Use a condom or other form of protection in order to prevent STIs. °Take aspirin only as told by your   health care provider. Make sure that you understand how much to take and what form to take. Work with your health care provider to find out whether it  is safe and beneficial for you to take aspirin daily. Ask your health care provider if you need to take a cholesterol-lowering medicine (statin). Find healthy ways to manage stress, such as: Meditation, yoga, or listening to music. Journaling. Talking to a trusted person. Spending time with friends and family. Minimize exposure to UV radiation to reduce your risk of skin cancer. Safety Always wear your seat belt while driving or riding in a vehicle. Do not drive: If you have been drinking alcohol. Do not ride with someone who has been drinking. When you are tired or distracted. While texting. If you have been using any mind-altering substances or drugs. Wear a helmet and other protective equipment during sports activities. If you have firearms in your house, make sure you follow all gun safety procedures. What's next? Visit your health care provider once a year for an annual wellness visit. Ask your health care provider how often you should have your eyes and teeth checked. Stay up to date on all vaccines. This information is not intended to replace advice given to you by your health care provider. Make sure you discuss any questions you have with your health care provider. Document Revised: 12/06/2020 Document Reviewed: 12/06/2020 Elsevier Patient Education  [place].

## 2021-07-19 NOTE — Telephone Encounter (Signed)
Patient scheduled a 6 month and 1 year follow up per AVS. Please add future labs for 01/08/21 and 07/30/22.

## 2021-07-19 NOTE — Assessment & Plan Note (Signed)
Encourage heart healthy diet such as MIND or DASH diet, increase exercise, avoid trans fats, simple carbohydrates and processed foods, consider a krill or fish or flaxseed oil cap daily.  °

## 2021-07-19 NOTE — Assessment & Plan Note (Signed)
Encouraged DASH or MIND diet, decrease po intake and increase exercise as tolerated. Needs 7-8 hours of sleep nightly. Avoid trans fats, eat small, frequent meals every 4-5 hours with lean proteins, complex carbs and healthy fats. Minimize simple carbs, high fat foods and processed foods 

## 2021-07-27 ENCOUNTER — Encounter: Payer: Self-pay | Admitting: Family Medicine

## 2021-08-23 ENCOUNTER — Ambulatory Visit (AMBULATORY_SURGERY_CENTER): Payer: PRIVATE HEALTH INSURANCE | Admitting: *Deleted

## 2021-08-23 ENCOUNTER — Other Ambulatory Visit: Payer: Self-pay

## 2021-08-23 VITALS — Ht 65.0 in | Wt 200.0 lb

## 2021-08-23 DIAGNOSIS — Z800 Family history of malignant neoplasm of digestive organs: Secondary | ICD-10-CM

## 2021-08-23 DIAGNOSIS — Z86010 Personal history of colonic polyps: Secondary | ICD-10-CM

## 2021-08-23 MED ORDER — PLENVU 140 G PO SOLR: 1.0000 | Freq: Once | ORAL | 0 refills | Status: AC

## 2021-08-23 NOTE — Progress Notes (Signed)
No egg or soy allergy known to patient  ?No issues known to pt with past sedation with any surgeries or procedures ?Patient denies ever being told they had issues or difficulty with intubation  ?No FH of Malignant Hyperthermia ?Pt is not on diet pills ?Pt is not on  home 02  ?Pt is not on blood thinners  ?Pt denies issues with constipation  ?No A fib or A flutter ? ?Pt is not vaccinated  for Covid  ? ?plenu Coupon to pt in PV today , Code to Pharmacy and  NO PA's for preps discussed with pt In PV today  ?Discussed with pt there will be an out-of-pocket cost for prep and that varies from $0 to 70 +  dollars - pt verbalized understanding  ? ?Due to the COVID-19 pandemic we are asking patients to follow certain guidelines in PV and the [place]   ?Pt aware of COVID protocols and [facility] guidelines  ? ?PV completed over the phone. Pt verified name, DOB, address and insurance during PV today.  ?Pt mailed instruction packet with copy of consent form to read and not return, and instructions.  ?Pt encouraged to call with questions or issues.  ?If pt has My chart, procedure instructions sent via My Chart   ? ?Pt.prep instructions along wit [place]. ?

## 2021-08-28 ENCOUNTER — Telehealth: Payer: Self-pay | Admitting: Gastroenterology

## 2021-08-28 DIAGNOSIS — Z86010 Personal history of colonic polyps: Secondary | ICD-10-CM

## 2021-08-28 DIAGNOSIS — Z800 Family history of malignant neoplasm of digestive organs: Secondary | ICD-10-CM

## 2021-08-28 MED ORDER — PLENVU 140 G PO SOLR: 1.0000 | ORAL | 0 refills | Status: DC

## 2021-08-28 NOTE — Telephone Encounter (Signed)
Patient call and stated that she was supposed to be getting a coupon in the mail for her prep medication and she has not received it. Patient is concerned that she is not going to get it in time before her procedure on 3/16. Please advise.  ?

## 2021-08-28 NOTE — Telephone Encounter (Signed)
SENT INSTRUCTIONS FOR HER 3-16 COLON IN HER MY CHART  ? ?Resent the Plenvu to the [facility] in [place]- sent in Plenvu medicare coupon information with the script and I also called the [facility] in [place] and Left the coupon information on their voice mail  ? ?Pt is aware and verbalized understanding  ?

## 2021-09-03 ENCOUNTER — Encounter: Payer: Self-pay | Admitting: Gastroenterology

## 2021-09-04 ENCOUNTER — Ambulatory Visit (HOSPITAL_BASED_OUTPATIENT_CLINIC_OR_DEPARTMENT_OTHER)
Admission: RE | Admit: 2021-09-04 | Discharge: 2021-09-04 | Disposition: A | Discharge status: Home / Self Care | Payer: PPO | Source: Ambulatory Visit | Attending: Family Medicine | Admitting: Family Medicine

## 2021-09-04 ENCOUNTER — Encounter: Payer: Self-pay | Admitting: Family Medicine

## 2021-09-04 ENCOUNTER — Other Ambulatory Visit: Payer: Self-pay

## 2021-09-04 DIAGNOSIS — L700 Acne vulgaris: Secondary | ICD-10-CM | POA: Diagnosis not present

## 2021-09-04 DIAGNOSIS — L300 Nummular dermatitis: Secondary | ICD-10-CM | POA: Diagnosis not present

## 2021-09-04 DIAGNOSIS — E2839 Other primary ovarian failure: Secondary | ICD-10-CM | POA: Diagnosis not present

## 2021-09-04 DIAGNOSIS — Z780 Asymptomatic menopausal state: Secondary | ICD-10-CM

## 2021-09-06 ENCOUNTER — Other Ambulatory Visit: Payer: Self-pay

## 2021-09-06 ENCOUNTER — Encounter: Payer: Self-pay | Admitting: Gastroenterology

## 2021-09-06 ENCOUNTER — Ambulatory Visit (AMBULATORY_SURGERY_CENTER): Payer: PPO | Admitting: Gastroenterology

## 2021-09-06 ENCOUNTER — Other Ambulatory Visit: Payer: Self-pay | Admitting: Gastroenterology

## 2021-09-06 VITALS — BP 111/69 | HR 65 | Temp 98.4°F | Resp 13 | Ht 65.0 in | Wt 200.0 lb

## 2021-09-06 DIAGNOSIS — Z86010 Personal history of colonic polyps: Secondary | ICD-10-CM | POA: Diagnosis not present

## 2021-09-06 DIAGNOSIS — K635 Polyp of colon: Secondary | ICD-10-CM | POA: Diagnosis not present

## 2021-09-06 DIAGNOSIS — I10 Essential (primary) hypertension: Secondary | ICD-10-CM | POA: Diagnosis not present

## 2021-09-06 DIAGNOSIS — K219 Gastro-esophageal reflux disease without esophagitis: Secondary | ICD-10-CM | POA: Diagnosis not present

## 2021-09-06 DIAGNOSIS — Z800 Family history of malignant neoplasm of digestive organs: Secondary | ICD-10-CM

## 2021-09-06 DIAGNOSIS — D125 Benign neoplasm of sigmoid colon: Secondary | ICD-10-CM | POA: Diagnosis not present

## 2021-09-06 DIAGNOSIS — D124 Benign neoplasm of descending colon: Secondary | ICD-10-CM | POA: Diagnosis not present

## 2021-09-06 MED ORDER — SODIUM CHLORIDE 0.9 % IV SOLN: 500.0000 mL | Freq: Once | INTRAVENOUS | Status: DC

## 2021-09-06 NOTE — Progress Notes (Signed)
Report to PACU, RN, vss, BBS= Clear.  

## 2021-09-06 NOTE — Op Note (Signed)
[facility] ?Patient Name: Julie Gray ?Procedure Date: 09/06/2021 11:01 AM ?MRN: [id number] ?Endoscopist: Ladene Artist , MD ?Age: 66 ?Referring MD:  ?Date of Birth: 07-Jul-1955 ?Gender: Female ?Account #: [id number] ?Procedure:                Colonoscopy ?Indications:              Surveillance: Personal history of adenomatous  ?                          polyps on last colonoscopy 5 years ago, Family  ?                          history of colon cancer, first degree relative. ?Medicines:                Monitored Anesthesia Care ?Procedure:                Pre-Anesthesia Assessment: ?                          - Prior to the procedure, a History and Physical  ?                          was performed, and patient medications and  ?                          allergies were reviewed. The patient's tolerance of  ?                          previous anesthesia was also reviewed. The risks  ?                          and benefits of the procedure and the sedation  ?                          options and risks were discussed with the patient.  ?                          All questions were answered, and informed consent  ?                          was obtained. Prior Anticoagulants: The patient has  ?                          taken no previous anticoagulant or antiplatelet  ?                          agents. ASA Grade Assessment: II - A patient with  ?                          mild systemic disease. After reviewing the risks  ?                          and benefits, the patient was deemed in  ?  satisfactory condition to undergo the procedure. ?                          After obtaining informed consent, the colonoscope  ?                          was passed under direct vision. Throughout the  ?                          procedure, the patient's blood pressure, pulse, and  ?                          oxygen saturations were monitored continuously. The  ?                          CF [id number]  #[id number] was introduced through the anus  ?                          and advanced to the the cecum, identified by  ?                          appendiceal orifice and ileocecal valve. The  ?                          ileocecal valve, appendiceal orifice, and rectum  ?                          were photographed. The quality of the bowel  ?                          preparation was good. The colonoscopy was performed  ?                          without difficulty. The patient tolerated the  ?                          procedure well. ?Scope In: 11:09:38 AM ?Scope Out: 11:24:16 AM ?Scope Withdrawal Time: 0 hours 12 minutes 48 seconds  ?Total Procedure Duration: 0 hours 14 minutes 38 seconds  ?Findings:                 The perianal and digital rectal examinations were  ?                          normal. ?                          Three sessile polyps were found in the sigmoid  ?                          colon (1) and descending colon (2). The polyps were  ?                          6 to 8 mm in size. These polyps were removed with a  ?  cold snare. Resection and retrieval were complete. ?                          The exam was otherwise without abnormality on  ?                          direct and retroflexion views. ?Complications:            No immediate complications. Estimated blood loss:  ?                          None. ?Estimated Blood Loss:     Estimated blood loss: none. ?Impression:               - Three 6 to 8 mm polyps in the sigmoid colon and  ?                          in the descending colon, removed with a cold snare.  ?                          Resected and retrieved. ?                          - The examination was otherwise normal on direct  ?                          and retroflexion views. ?Recommendation:           - Repeat colonoscopy, likely 3-5 years, after  ?                          studies are complete for surveillance based on  ?                          pathology  results. ?                          - Patient has a contact number available for  ?                          emergencies. The signs and symptoms of potential  ?                          delayed complications were discussed with the  ?                          patient. Return to normal activities tomorrow.  ?                          Written discharge instructions were provided to the  ?                          patient. ?                          - Resume previous diet. ?                          -  Continue present medications. ?                          - Await pathology results. ?Ladene Artist, MD ?09/06/2021 11:29:29 AM ?This report has been signed electronically. ?

## 2021-09-06 NOTE — Progress Notes (Signed)
Called to room to assist during endoscopic procedure.  Patient ID and intended procedure confirmed with present staff. Received instructions for my participation in the procedure from the performing physician.  

## 2021-09-06 NOTE — Progress Notes (Signed)
? ?History & Physical ? ?Primary Care Physician:  Mosie Lukes, MD ?Primary Gastroenterologist: Lucio Edward, MD ? ?CHIEF COMPLAINT:  [facility], Personal history of colon polyps  ? ?HPI: Julie Gray is a 65 y.o. female with a personal history of multiple adenomatous colon polyps in 2018 and a family history of colon cancer, first-degree relative, for colonoscopy. ? ? ?Past Medical History:  ?Diagnosis Date  ? Adenomatous colon polyp   ? Allergic rhinitis   ? Allergy   ? late fall, spring   ? Anemia   ? as teenager   ? Breast abscess 09/22/2014  ? recurrent  ? Cataract   ? bilateral,removed  ? Colon polyps   ? Depression   ? pt denies- uses venlafaxine with menopause ,pt,denies 08/23/21  ? Facial lesion 06/2010  ? removed from right side of face--precancerous  ? GERD (gastroesophageal reflux disease)   ? Hypertension   ? Hypoglycemia   ? Hypoglycemia   ? Osteoarthritis   ? knees neck and hands- OA  ? Pain of left heel 02/17/2017  ? Preventative health care 03/07/2014  ? Snoring 2008  ? negative sleep study of OSA  ? Tendonitis 08/16/2016  ? Visual disturbance   ? visual flashes, referred to [facility] (Normal funduscopic exam)  ? ? ?Past Surgical History:  ?Procedure Laterality Date  ? ABDOMINAL HYSTERECTOMY  2002  ? partial  ? CARPAL TUNNEL RELEASE  05/2010, 06/2010  ? bilateral--Dr Tamera Punt  ? CATARACT EXTRACTION Bilateral   ? CERVICAL FUSION  06/15/2009  ? right sided C6-C7 radiculopathy status post anterior cervical diskectomy and fusion at the C5-6 and C6-7 levels. Phylliss Bob, MD  ? CHOLECYSTECTOMY N/A 10/10/2016  ? Procedure: LAPAROSCOPIC CHOLECYSTECTOMY;  Surgeon: Autumn Messing III, MD;  Location: [facility];  Service: General;  Laterality: N/A;  ? COLONOSCOPY    ? DORSAL COMPARTMENT RELEASE Left 02/06/2015  ? Procedure: DEQUERVAIN'S RELEASE;  Surgeon: Milly Jakob, MD;  Location: [facility];  Service: Orthopedics;  Laterality: Left;  ? EYE SURGERY Right   ? right eye stye removed  ?  EYE SURGERY Bilateral   ? radial K surgery  ? FOOT SURGERY Left   ? removed arthritis put in plate and screws,Nov 3,2022  ? FRACTURE SURGERY  08/12/2014 & 02/06/2015  ? Original break in Feb & add'l surg in Aug  Plate in Left arm  ? HARDWARE REMOVAL Left 02/06/2015  ? Procedure: LEFT WRIST REMOVAL OF HARDWARE;  Surgeon: Milly Jakob, MD;  Location: [facility];  Service: Orthopedics;  Laterality: Left;  ? Left wrist surgery    ? Feb.19th 2016  ? NASAL SEPTUM SURGERY    ? deviated septum  ? POLYPECTOMY    ? SPINE SURGERY  5 +/- yrs ago  ? Neck vertebrae fusion  ? Surgery on left ankle for fracture    ? age 18 years old  ? ? ?Prior to Admission medications   ?Medication Sig Start Date End Date Taking? Authorizing Provider  ?Ascorbic Acid (VITAMIN C) 1000 MG tablet 2 (two) times daily. 12/23/19  Yes [provider]  ?aspirin 81 MG EC tablet Take 81 mg by mouth daily.     Yes [provider]  ?Cholecalciferol (VITAMIN D3) 25 MCG (1000 UT) CAPS 2,000 Units 2 (two) times daily. 11/23/19  Yes [provider]  ?fexofenadine (ALLEGRA) 180 MG tablet Take 180 mg by mouth at bedtime as needed for allergies.    Yes [provider]  ?  gabapentin (NEURONTIN) 300 MG capsule Take 1 capsule (300 mg total) by mouth 2 (two) times daily. 12/12/20  Yes Mosie Lukes, MD  ?irbesartan-hydrochlorothiazide (AVALIDE) 300-12.5 MG tablet Take 1 tablet by mouth daily. 12/12/20  Yes Mosie Lukes, MD  ?Astrid Drafts 350 MG CAPS Take 350 mg by mouth daily.   Yes [provider]  ?Misc Natural Products (GLUCOSAMINE CHONDROITIN TRIPLE) TABS Take 1 tablet by mouth 2 (two) times daily.   Yes [provider]  ?OVER THE COUNTER MEDICATION daily. Shift probotic take one capsule daily   Yes [provider]  ?pantoprazole (PROTONIX) 40 MG tablet Take 1 tablet (40 mg total) by mouth daily. 12/12/20  Yes Mosie Lukes, MD  ?Soy Isoflavones 40 MG TABS Take 50 mg by mouth 2 (two) times  daily.   Yes [provider]  ?tretinoin (RETIN-A) 0.05 % cream Apply topically at bedtime. 09/04/21  Yes [provider]  ?triamcinolone cream (KENALOG) 0.1 % Apply topically 2 (two) times daily. 09/04/21  Yes [provider]  ?Vitamins-Lipotropics (B-100 COMPLEX) TABS daily. 11/23/19  Yes [provider]  ? ? ?Current Outpatient Medications  ?Medication Sig Dispense Refill  ? Ascorbic Acid (VITAMIN C) 1000 MG tablet 2 (two) times daily.    ? aspirin 81 MG EC tablet Take 81 mg by mouth daily.      ? Cholecalciferol (VITAMIN D3) 25 MCG (1000 UT) CAPS 2,000 Units 2 (two) times daily.    ? fexofenadine (ALLEGRA) 180 MG tablet Take 180 mg by mouth at bedtime as needed for allergies.     ? gabapentin (NEURONTIN) 300 MG capsule Take 1 capsule (300 mg total) by mouth 2 (two) times daily. 180 capsule 3  ? irbesartan-hydrochlorothiazide (AVALIDE) 300-12.5 MG tablet Take 1 tablet by mouth daily. 90 tablet 3  ? Krill Oil 350 MG CAPS Take 350 mg by mouth daily.    ? Misc Natural Products (GLUCOSAMINE CHONDROITIN TRIPLE) TABS Take 1 tablet by mouth 2 (two) times daily.    ? OVER THE COUNTER MEDICATION daily. Shift probotic take one capsule daily    ? pantoprazole (PROTONIX) 40 MG tablet Take 1 tablet (40 mg total) by mouth daily. 90 tablet 3  ? Soy Isoflavones 40 MG TABS Take 50 mg by mouth 2 (two) times daily.    ? tretinoin (RETIN-A) 0.05 % cream Apply topically at bedtime.    ? triamcinolone cream (KENALOG) 0.1 % Apply topically 2 (two) times daily.    ? Vitamins-Lipotropics (B-100 COMPLEX) TABS daily.    ? ?Current Facility-Administered Medications  ?Medication Dose Route Frequency Provider Last Rate Last Admin  ? 0.9 %  sodium chloride infusion  500 mL Intravenous Once Ladene Artist, MD      ? ? ?Allergies as of 09/06/2021  ? (No Known Allergies)  ? ? ?Family History  ?Problem Relation Age of Onset  ? Stroke Mother   ? Colon cancer Mother   ? Hypertension Mother   ? Hyperlipidemia Mother    ? Arthritis Mother   ?     OA and rheumatoid  ? Heart disease Mother   ?     MI 6  ? Colon polyps Mother   ? Alcohol abuse Father   ? Heart disease Father   ? Hypertension Father   ? Colon polyps Father   ? Arthritis Maternal Grandmother   ?     RA and OA  ? Diabetes Maternal Grandmother   ? Heart disease Maternal  Grandmother   ? Hypertension Maternal Grandmother   ? Stroke Maternal Grandmother   ? Heart disease Maternal Grandfather   ? Hypertension Maternal Grandfather   ? Diabetes Paternal Grandfather   ? Esophageal cancer Neg Hx   ? Rectal cancer Neg Hx   ? Stomach cancer Neg Hx   ? ? ?Social History  ? ?Socioeconomic History  ? Marital status: Married  ?  Spouse name: Not on file  ? Number of children: Not on file  ? Years of education: Not on file  ? Highest education level: Not on file  ?Occupational History  ? Occupation: Primary school teacher for [employer]  ?Tobacco Use  ? Smoking status: Never  ?  Passive exposure: Past ("FATHER WAS CHAIN SMOKER")  ? Smokeless tobacco: Never  ?Vaping Use  ? Vaping Use: Never used  ?Substance and Sexual Activity  ? Alcohol use: No  ? Drug use: Never  ? Sexual activity: Yes  ?  Comment: lives with husband, no dietary restrictions  ?Other Topics Concern  ? Not on file  ?Social History Narrative  ? Not on file  ? ?Social Determinants of Health  ? ?Financial Resource Strain: Not on file  ?Food Insecurity: Not on file  ?Transportation Needs: Not on file  ?Physical Activity: Not on file  ?Stress: Not on file  ?Social Connections: Not on file  ?Intimate Partner Violence: Not on file  ? ? ?Review of Systems: ? ?All systems reviewed an negative except where noted in HPI. ? ?Gen: Denies any fever, chills, sweats, anorexia, fatigue, weakness, malaise, weight loss, and sleep disorder ?CV: Denies chest pain, angina, palpitations, syncope, orthopnea, PND, peripheral edema, and claudication. ?Resp: Denies dyspnea at rest, dyspnea with exercise, cough, sputum, wheezing,  coughing up blood, and pleurisy. ?GI: Denies vomiting blood, jaundice, and fecal incontinence.   Denies dysphagia or odynophagia. ?GU : Denies urinary burning, blood in urine, urinary frequency, urinary hesit

## 2021-09-06 NOTE — Patient Instructions (Signed)
Discharge instructions given. Handout on polyps. Resume previous medications. YOU HAD AN ENDOSCOPIC PROCEDURE TODAY AT THE [facility]:   Refer to the procedure report that was given to you for any specific questions about what was found during the examination.  If the procedure report does not answer your questions, please call your gastroenterologist to clarify.  If you requested that your care partner not be given the details of your procedure findings, then the procedure report has been included in a sealed envelope for you to review at your convenience later.  YOU SHOULD EXPECT: Some feelings of bloating in the abdomen. Passage of more gas than usual.  Walking can help get rid of the air that was put into your GI tract during the procedure and reduce the bloating. If you had a lower endoscopy (such as a colonoscopy or flexible sigmoidoscopy) you may notice spotting of blood in your stool or on the toilet paper. If you underwent a bowel prep for your procedure, you may not have a normal bowel movement for a few days.  Please Note:  You might notice some irritation and congestion in your nose or some drainage.  This is from the oxygen used during your procedure.  There is no need for concern and it should clear up in a day or so.  SYMPTOMS TO REPORT IMMEDIATELY:  Following lower endoscopy (colonoscopy or flexible sigmoidoscopy):  Excessive amounts of blood in the stool  Significant tenderness or worsening of abdominal pains  Swelling of the abdomen that is new, acute  Fever of 100F or higher   For urgent or emergent issues, a gastroenterologist can be reached at any hour by calling [phone]. Do not use MyChart messaging for urgent concerns.    DIET:  We do recommend a small meal at first, but then you may proceed to your regular diet.  Drink plenty of fluids but you should avoid alcoholic beverages for 24 hours.  ACTIVITY:  You should plan to take it easy for the rest  of today and you should NOT DRIVE or use heavy machinery until tomorrow (because of the sedation medicines used during the test).    FOLLOW UP: Our staff will call the number listed on your records 48-72 hours following your procedure to check on you and address any questions or concerns that you may have regarding the information given to you following your procedure. If we do not reach you, we will leave a message.  We will attempt to reach you two times.  During this call, we will ask if you have developed any symptoms of COVID 19. If you develop any symptoms (ie: fever, flu-like symptoms, shortness of breath, cough etc.) before then, please call [phone].  If you test positive for Covid 19 in the 2 weeks post procedure, please call and report this information to us.    If any biopsies were taken you will be contacted by phone or by letter within the next 1-3 weeks.  Please call us at [phone] if you have not heard about the biopsies in 3 weeks.    SIGNATURES/CONFIDENTIALITY: You and/or your care partner have signed paperwork which will be entered into your electronic medical record.  These signatures attest to the fact that that the information above on your After Visit Summary has been reviewed and is understood.  Full responsibility of the confidentiality of this discharge information lies with you and/or your care-partner.  

## 2021-09-06 NOTE — Progress Notes (Signed)
Pt's states no medical or surgical changes since previsit or office visit.  ° °VS DT °

## 2021-09-07 DIAGNOSIS — M79672 Pain in left foot: Secondary | ICD-10-CM | POA: Diagnosis not present

## 2021-09-10 ENCOUNTER — Other Ambulatory Visit: Payer: Self-pay | Admitting: Orthopaedic Surgery

## 2021-09-10 ENCOUNTER — Telehealth: Payer: Self-pay

## 2021-09-10 DIAGNOSIS — M79672 Pain in left foot: Secondary | ICD-10-CM

## 2021-09-10 NOTE — Telephone Encounter (Signed)
Left message on answering machine. 

## 2021-09-24 ENCOUNTER — Encounter: Payer: Self-pay | Admitting: Gastroenterology

## 2021-10-02 ENCOUNTER — Ambulatory Visit
Admission: RE | Admit: 2021-10-02 | Discharge: 2021-10-02 | Disposition: A | Discharge status: Home / Self Care | Payer: PPO | Source: Ambulatory Visit | Attending: Orthopaedic Surgery | Admitting: Orthopaedic Surgery

## 2021-10-02 DIAGNOSIS — M79672 Pain in left foot: Secondary | ICD-10-CM

## 2021-10-24 DIAGNOSIS — M79672 Pain in left foot: Secondary | ICD-10-CM | POA: Diagnosis not present

## 2021-11-08 DIAGNOSIS — T8484XA Pain due to internal orthopedic prosthetic devices, implants and grafts, initial encounter: Secondary | ICD-10-CM | POA: Diagnosis not present

## 2021-11-08 DIAGNOSIS — G8918 Other acute postprocedural pain: Secondary | ICD-10-CM | POA: Diagnosis not present

## 2021-12-21 ENCOUNTER — Other Ambulatory Visit: Payer: Self-pay | Admitting: Family Medicine

## 2021-12-21 DIAGNOSIS — M791 Myalgia, unspecified site: Secondary | ICD-10-CM

## 2021-12-21 DIAGNOSIS — Z780 Asymptomatic menopausal state: Secondary | ICD-10-CM

## 2021-12-21 DIAGNOSIS — M79672 Pain in left foot: Secondary | ICD-10-CM | POA: Diagnosis not present

## 2021-12-21 DIAGNOSIS — I10 Essential (primary) hypertension: Secondary | ICD-10-CM

## 2022-01-08 ENCOUNTER — Other Ambulatory Visit (INDEPENDENT_AMBULATORY_CARE_PROVIDER_SITE_OTHER): Payer: PPO

## 2022-01-08 DIAGNOSIS — R739 Hyperglycemia, unspecified: Secondary | ICD-10-CM | POA: Diagnosis not present

## 2022-01-08 DIAGNOSIS — I10 Essential (primary) hypertension: Secondary | ICD-10-CM

## 2022-01-08 DIAGNOSIS — E782 Mixed hyperlipidemia: Secondary | ICD-10-CM | POA: Diagnosis not present

## 2022-01-08 LAB — LIPID PANEL
Cholesterol: 186 mg/dL (ref 0–200)
HDL: 53.8 mg/dL (ref >39.00)
LDL Cholesterol: 101 mg/dL — ABNORMAL HIGH (ref 0–99)
NonHDL: 132.14
Total CHOL/HDL Ratio: 3
Triglycerides: 156 mg/dL — ABNORMAL HIGH (ref 0.0–149.0)
VLDL: 31.2 mg/dL (ref 0.0–40.0)

## 2022-01-08 LAB — CBC WITH DIFFERENTIAL/PLATELET
Basophils Absolute: 0 10*3/uL (ref 0.0–0.1)
Basophils Relative: 0.5 % (ref 0.0–3.0)
Eosinophils Absolute: 0.2 10*3/uL (ref 0.0–0.7)
Eosinophils Relative: 4 % (ref 0.0–5.0)
HCT: 40.9 % (ref 36.0–46.0)
Hemoglobin: 14.1 g/dL (ref 12.0–15.0)
Lymphocytes Relative: 34.7 % (ref 12.0–46.0)
Lymphs Abs: 1.5 10*3/uL (ref 0.7–4.0)
MCHC: 34.4 g/dL (ref 30.0–36.0)
MCV: 89.6 fl (ref 78.0–100.0)
Monocytes Absolute: 0.4 10*3/uL (ref 0.1–1.0)
Monocytes Relative: 8.7 % (ref 3.0–12.0)
Neutro Abs: 2.3 10*3/uL (ref 1.4–7.7)
Neutrophils Relative %: 52.1 % (ref 43.0–77.0)
Platelets: 170 10*3/uL (ref 150.0–400.0)
RBC: 4.57 Mil/uL (ref 3.87–5.11)
RDW: 12.8 % (ref 11.5–15.5)
WBC: 4.4 10*3/uL (ref 4.0–10.5)

## 2022-01-08 LAB — COMPREHENSIVE METABOLIC PANEL
ALT: 20 U/L (ref 0–35)
AST: 20 U/L (ref 0–37)
Albumin: 4.3 g/dL (ref 3.5–5.2)
Alkaline Phosphatase: 107 U/L (ref 39–117)
BUN: 17 mg/dL (ref 6–23)
CO2: 33 mEq/L — ABNORMAL HIGH (ref 19–32)
Calcium: 9.5 mg/dL (ref 8.4–10.5)
Chloride: 104 mEq/L (ref 96–112)
Creatinine, Ser: 1.03 mg/dL (ref 0.40–1.20)
GFR: 56.84 mL/min — ABNORMAL LOW (ref >60.00)
Glucose, Bld: 94 mg/dL (ref 70–99)
Potassium: 3.9 mEq/L (ref 3.5–5.1)
Sodium: 143 mEq/L (ref 135–145)
Total Bilirubin: 0.6 mg/dL (ref 0.2–1.2)
Total Protein: 6.4 g/dL (ref 6.0–8.3)

## 2022-01-08 LAB — TSH: TSH: 1.45 u[IU]/mL (ref 0.35–5.50)

## 2022-01-08 LAB — HEMOGLOBIN A1C: Hgb A1c MFr Bld: 5.2 % (ref 4.6–6.5)

## 2022-01-09 NOTE — Progress Notes (Deleted)
Subjective:    [name], female    DOB: January 21, 1956, 66 y.o.   MRN: [id number]  No chief complaint on file.   HPI Patient is in today for a follow up.  Past Medical History:  Diagnosis Date   Adenomatous colon polyp    Allergic rhinitis    Allergy    late fall, spring    Anemia    as teenager    Breast abscess 09/22/2014   recurrent   Cataract    bilateral,removed   Colon polyps    Depression    pt denies- uses venlafaxine with menopause ,pt,denies 08/23/21   Facial lesion 06/2010   removed from right side of face--precancerous   GERD (gastroesophageal reflux disease)    Hypertension    Hypoglycemia    Hypoglycemia    Osteoarthritis    knees neck and hands- OA   Pain of left heel 02/17/2017   Preventative health care 03/07/2014   Snoring 2008   negative sleep study of OSA   Tendonitis 08/16/2016   Visual disturbance    visual flashes, referred to [facility] (Normal funduscopic exam)    Past Surgical History:  Procedure Laterality Date   ABDOMINAL HYSTERECTOMY  2002   partial   CARPAL TUNNEL RELEASE  05/2010, 06/2010   bilateral--Dr Tamera Punt   CATARACT EXTRACTION Bilateral    CERVICAL FUSION  06/15/2009   right sided C6-C7 radiculopathy status post anterior cervical diskectomy and fusion at the C5-6 and C6-7 levels. Phylliss Bob, MD   CHOLECYSTECTOMY N/A 10/10/2016   Procedure: LAPAROSCOPIC CHOLECYSTECTOMY;  Surgeon: Autumn Messing III, MD;  Location: [facility];  Service: General;  Laterality: N/A;   COLONOSCOPY     DORSAL COMPARTMENT RELEASE Left 02/06/2015   Procedure: DEQUERVAIN'S RELEASE;  Surgeon: Milly Jakob, MD;  Location: [facility];  Service: Orthopedics;  Laterality: Left;   EYE SURGERY Right    right eye stye removed   EYE SURGERY Bilateral    radial K surgery   FOOT SURGERY Left    removed arthritis put in plate and screws,Nov [facility]ianza  08/12/2014 & 02/06/2015   Original break in Feb &  add'l surg in Aug  Plate in Left arm   HARDWARE REMOVAL Left 02/06/2015   Procedure: LEFT WRIST REMOVAL OF HARDWARE;  Surgeon: Milly Jakob, MD;  Location: [facility];  Service: Orthopedics;  Laterality: Left;   Left wrist surgery     Feb.19th 2016   NASAL SEPTUM SURGERY     deviated septum   POLYPECTOMY     SPINE SURGERY  5 +/- yrs ago   Neck vertebrae fusion   Surgery on left ankle for fracture     age 34 years old    Family History  Problem Relation Age of Onset   Stroke Mother    Colon cancer Mother    Hypertension Mother    Hyperlipidemia Mother    Arthritis Mother        OA and rheumatoid   Heart disease Mother        MI 24   Colon polyps Mother    Alcohol abuse Father    Heart disease Father    Hypertension Father    Colon polyps Father    Arthritis Maternal Grandmother        RA and OA   Diabetes Maternal Grandmother    Heart disease Maternal Grandmother    Hypertension Maternal Grandmother  Stroke Maternal Grandmother    Heart disease Maternal Grandfather    Hypertension Maternal Grandfather    Diabetes Paternal Grandfather    Esophageal cancer Neg Hx    Rectal cancer Neg Hx    Stomach cancer Neg Hx     Social History   Socioeconomic History   Marital status: Married    Spouse name: Not on file   Number of children: Not on file   Years of education: Not on file   Highest education level: Not on file  Occupational History   Occupation: Primary school teacher for [employer]   Smoking status: Never    Passive exposure: Past ("FATHER WAS CHAIN SMOKER")   Smokeless tobacco: Never  Vaping Use   Vaping Use: Never used  Substance and Sexual Activity   Alcohol use: No   Drug use: Never   Sexual activity: Yes    Comment: lives with husband, no dietary restrictions  Other Topics Concern   Not on file  Social History Narrative   Not on file   Social Determinants of Health   Financial Resource Strain: Not on  file  Food Insecurity: Not on file  Transportation Needs: Not on file  Physical Activity: Not on file  Stress: Not on file  Social Connections: Not on file  Intimate Partner Violence: Not on file    Outpatient Medications Prior to Visit  Medication Sig Dispense Refill   Ascorbic Acid (VITAMIN C) 1000 MG tablet 2 (two) times daily.     aspirin 81 MG EC tablet Take 81 mg by mouth daily.       Cholecalciferol (VITAMIN D3) 25 MCG (1000 UT) CAPS 2,000 Units 2 (two) times daily.     fexofenadine (ALLEGRA) 180 MG tablet Take 180 mg by mouth at bedtime as needed for allergies.      gabapentin (NEURONTIN) 300 MG capsule TAKE 1 CAPSULE (300 MG TOTAL) BY MOUTH TWICE A DAY 180 capsule 3   irbesartan-hydrochlorothiazide (AVALIDE) 300-12.5 MG tablet Take 1 tablet by mouth daily. 90 tablet 3   Krill Oil 350 MG CAPS Take 350 mg by mouth daily.     Misc Natural Products (GLUCOSAMINE CHONDROITIN TRIPLE) TABS Take 1 tablet by mouth 2 (two) times daily.     OVER THE COUNTER MEDICATION daily. Shift probotic take one capsule daily     pantoprazole (PROTONIX) 40 MG tablet TAKE 1 TABLET ('40MG'$ ) BY MOUTH EVERY DAY 90 tablet 3   Soy Isoflavones 40 MG TABS Take 50 mg by mouth 2 (two) times daily.     tretinoin (RETIN-A) 0.05 % cream Apply topically at bedtime.     triamcinolone cream (KENALOG) 0.1 % Apply topically 2 (two) times daily.     Vitamins-Lipotropics (B-100 COMPLEX) TABS daily.     No facility-administered medications prior to visit.    No Known Allergies  ROS     Objective:    Physical Exam  There were no vitals taken for this visit. Wt Readings from Last 3 Encounters:  09/06/21 200 lb (90.7 kg)  08/23/21 200 lb (90.7 kg)  07/19/21 207 lb 9.6 oz (94.2 kg)    Diabetic Foot Exam - Simple   No data filed    Lab Results  Component Value Date   WBC 4.4 01/08/2022   HGB 14.1 01/08/2022   HCT 40.9 01/08/2022   PLT 170.0 01/08/2022   GLUCOSE 94 01/08/2022   CHOL 186 01/08/2022   TRIG  156.0 (H) 01/08/2022   HDL 53.80  01/08/2022   LDLDIRECT 115.0 03/15/2019   LDLCALC 101 (H) 01/08/2022   ALT 20 01/08/2022   AST 20 01/08/2022   NA 143 01/08/2022   K 3.9 01/08/2022   CL 104 01/08/2022   CREATININE 1.03 01/08/2022   BUN 17 01/08/2022   CO2 33 (H) 01/08/2022   TSH 1.45 01/08/2022   INR 0.97 06/13/2009   HGBA1C 5.2 01/08/2022    Lab Results  Component Value Date   TSH 1.45 01/08/2022   Lab Results  Component Value Date   WBC 4.4 01/08/2022   HGB 14.1 01/08/2022   HCT 40.9 01/08/2022   MCV 89.6 01/08/2022   PLT 170.0 01/08/2022   Lab Results  Component Value Date   NA 143 01/08/2022   K 3.9 01/08/2022   CO2 33 (H) 01/08/2022   GLUCOSE 94 01/08/2022   BUN 17 01/08/2022   CREATININE 1.03 01/08/2022   BILITOT 0.6 01/08/2022   ALKPHOS 107 01/08/2022   AST 20 01/08/2022   ALT 20 01/08/2022   PROT 6.4 01/08/2022   ALBUMIN 4.3 01/08/2022   CALCIUM 9.5 01/08/2022   ANIONGAP 9 10/09/2016   GFR 56.84 (L) 01/08/2022   Lab Results  Component Value Date   CHOL 186 01/08/2022   Lab Results  Component Value Date   HDL 53.80 01/08/2022   Lab Results  Component Value Date   LDLCALC 101 (H) 01/08/2022   Lab Results  Component Value Date   TRIG 156.0 (H) 01/08/2022   Lab Results  Component Value Date   CHOLHDL 3 01/08/2022   Lab Results  Component Value Date   HGBA1C 5.2 01/08/2022       Assessment & Plan:      Problem List Items Addressed This Visit   None   I am having Julie Palms. Ahlgrim "Cindy" maintain her fexofenadine, Soy Isoflavones, aspirin EC, Krill Oil, Glucosamine Chondroitin Triple, vitamin C, Vitamin D3, B-100 Complex, irbesartan-hydrochlorothiazide, OVER THE COUNTER MEDICATION, tretinoin, triamcinolone cream, gabapentin, and pantoprazole.  No orders of the defined types were placed in this encounter.

## 2022-01-10 ENCOUNTER — Encounter: Payer: Self-pay | Admitting: Family Medicine

## 2022-01-10 ENCOUNTER — Ambulatory Visit (INDEPENDENT_AMBULATORY_CARE_PROVIDER_SITE_OTHER): Payer: PPO | Admitting: Family Medicine

## 2022-01-10 VITALS — BP 122/78 | HR 76 | Resp 20 | Ht 65.0 in | Wt 209.0 lb

## 2022-01-10 DIAGNOSIS — M25551 Pain in right hip: Secondary | ICD-10-CM | POA: Diagnosis not present

## 2022-01-10 DIAGNOSIS — M5430 Sciatica, unspecified side: Secondary | ICD-10-CM | POA: Diagnosis not present

## 2022-01-10 DIAGNOSIS — I10 Essential (primary) hypertension: Secondary | ICD-10-CM

## 2022-01-10 DIAGNOSIS — M109 Gout, unspecified: Secondary | ICD-10-CM

## 2022-01-10 DIAGNOSIS — R739 Hyperglycemia, unspecified: Secondary | ICD-10-CM

## 2022-01-10 DIAGNOSIS — E6609 Other obesity due to excess calories: Secondary | ICD-10-CM

## 2022-01-10 DIAGNOSIS — E782 Mixed hyperlipidemia: Secondary | ICD-10-CM | POA: Diagnosis not present

## 2022-01-10 MED ORDER — METHYLPREDNISOLONE ACETATE 40 MG/ML IJ SUSP
40.0000 mg | Freq: Once | INTRAMUSCULAR | Status: AC
  Administered 2022-01-10: 40 mg via INTRAMUSCULAR

## 2022-01-10 MED ORDER — METHYLPREDNISOLONE 4 MG PO TABS: ORAL_TABLET | ORAL | 0 refills | Status: DC

## 2022-01-10 NOTE — Assessment & Plan Note (Signed)
Encourage heart healthy diet such as MIND or DASH diet, increase exercise, avoid trans fats, simple carbohydrates and processed foods, consider a krill or fish or flaxseed oil cap daily.  °

## 2022-01-10 NOTE — Patient Instructions (Addendum)
Yerba Matte tea do not drink  Sciatica  Sciatica is pain, numbness, weakness, or tingling along the path of the sciatic nerve. The sciatic nerve starts in the lower back and runs down the back of each leg. The nerve controls the muscles in the lower leg and in the back of the knee. It also provides feeling (sensation) to the back of the thigh, the lower leg, and the sole of the foot. Sciatica is a symptom of another medical condition that pinches or puts pressure on the sciatic nerve. Sciatica most often only affects one side of the body. Sciatica usually goes away on its own or with treatment. In some cases, sciatica may come back (recur). What are the causes? This condition is caused by pressure on the sciatic nerve or pinching of the nerve. This may be the result of: A disk in between the bones of the spine bulging out too far (herniated disk). Age-related changes in the spinal disks. A pain disorder that affects a muscle in the buttock. Extra bone growth near the sciatic nerve. A break (fracture) of the pelvis. Pregnancy. Tumor. This is rare. What increases the risk? The following factors may make you more likely to develop this condition: Playing sports that place pressure or stress on the spine. Having poor strength and flexibility. A history of back injury or surgery. Sitting for long periods of time. Doing activities that involve repetitive bending or lifting. Obesity. What are the signs or symptoms? Symptoms can vary from mild to very severe, and they may include: Any of these problems in the lower back, leg, hip, or buttock: Mild tingling, numbness, or dull aches. Burning sensations. Sharp pains. Numbness in the back of the calf or the sole of the foot. Leg weakness. Severe back pain that makes movement difficult. Symptoms may get worse when you cough, sneeze, or laugh, or when you sit or stand for long periods of time. How is this diagnosed? This condition may be  diagnosed based on: Your symptoms and medical history. A physical exam. Blood tests. Imaging tests, such as: X-rays. MRI. CT scan. How is this treated? In many cases, this condition improves on its own without treatment. However, treatment may include: Reducing or modifying physical activity. Exercising and stretching. Icing and applying heat to the affected area. Medicines that help to: Relieve pain and swelling. Relax your muscles. Injections of medicines that help to relieve pain, irritation, and inflammation around the sciatic nerve (steroids). Surgery. Follow these instructions at home: Medicines Take over-the-counter and prescription medicines only as told by your health care provider. Ask your health care provider if the medicine prescribed to you: Requires you to avoid driving or using heavy machinery. Can cause constipation. You may need to take these actions to prevent or treat constipation: Drink enough fluid to keep your urine pale yellow. Take over-the-counter or prescription medicines. Eat foods that are high in fiber, such as beans, whole grains, and fresh fruits and vegetables. Limit foods that are high in fat and processed sugars, such as fried or sweet foods. Managing pain     If directed, put ice on the affected area. Put ice in a plastic bag. Place a towel between your skin and the bag. Leave the ice on for 20 minutes, 2-3 times a day. If directed, apply heat to the affected area. Use the heat source that your health care provider recommends, such as a moist heat pack or a heating pad. Place a towel between your skin and the  heat source. Leave the heat on for 20-30 minutes. Remove the heat if your skin turns bright red. This is especially important if you are unable to feel pain, heat, or cold. You may have a greater risk of getting burned. Activity  Return to your normal activities as told by your health care provider. Ask your health care provider what  activities are safe for you. Avoid activities that make your symptoms worse. Take brief periods of rest throughout the day. When you rest for longer periods, mix in some mild activity or stretching between periods of rest. This will help to prevent stiffness and pain. Avoid sitting for long periods of time without moving. Get up and move around at least one time each hour. Exercise and stretch regularly, as told by your health care provider. Do not lift anything that is heavier than 10 lb (4.5 kg) while you have symptoms of sciatica. When you do not have symptoms, you should still avoid heavy lifting, especially repetitive heavy lifting. When you lift objects, always use proper lifting technique, which includes: Bending your knees. Keeping the load close to your body. Avoiding twisting. General instructions Maintain a healthy weight. Excess weight puts extra stress on your back. Wear supportive, comfortable shoes. Avoid wearing high heels. Avoid sleeping on a mattress that is too soft or too hard. A mattress that is firm enough to support your back when you sleep may help to reduce your pain. Keep all follow-up visits as told by your health care provider. This is important. Contact a health care provider if: You have pain that: Wakes you up when you are sleeping. Gets worse when you lie down. Is worse than you have experienced in the past. Lasts longer than 4 weeks. You have an unexplained weight loss. Get help right away if: You are not able to control when you urinate or have bowel movements (incontinence). You have: Weakness in your lower back, pelvis, buttocks, or legs that gets worse. Redness or swelling of your back. A burning sensation when you urinate. Summary Sciatica is pain, numbness, weakness, or tingling along the path of the sciatic nerve. This condition is caused by pressure on the sciatic nerve or pinching of the nerve. Sciatica can cause pain, numbness, or tingling in  the lower back, legs, hips, and buttocks. Treatment often includes rest, exercise, medicines, and applying ice or heat. This information is not intended to replace advice given to you by your health care provider. Make sure you discuss any questions you have with your health care provider. Document Revised: 06/29/2018 Document Reviewed: 06/29/2018 Elsevier Patient Education  [place].

## 2022-01-10 NOTE — Assessment & Plan Note (Signed)
Hydrate and monitor 

## 2022-01-10 NOTE — Assessment & Plan Note (Signed)
Posterior hip pain secondary to her immobility due to her left foot surgery and being on a scooter for months. Got marginal relief from Prednisone and Robaxin but is really flared since stopping Prednisone. Will give a trial of Medrol and she will continue chiropractic and consider acupuncture

## 2022-01-10 NOTE — Progress Notes (Signed)
Subjective:   By signing my name below, I, Julie Gray, attest that this documentation has been prepared under the direction and in the presence of Mosie Lukes, MD 01/10/2022.   [name], female    DOB: 04/23/1956, 66 y.o.   MRN: [id number]  Chief Complaint  Patient presents with   Follow-up    HPI Patient is in today for an office visit.  Sciatic nerve pain: She complains of sciatic nerve pain specific to her right side. She describes a shooting pain from her buttocks down to her right anterior leg. She has been prescribed Methocarbamol and Prednisone by her orthopedic surgeon Dr. Lucia Gaskins to manage her pain. She states that the these medications helped to manage the pain when taken together. She reports that she has had 10 chiropractic sessions which provide short-term relief, but the pain returns the next day. She states that she has also been performing at-home foot exercises but this has not helped to manage her pain. She is inquiring about acupuncture to help better manage her sciatic nerve pain.   Foot pain: She reports that she had foot surgery on 04/26/2021 to manage her arthritis and bone spurs.  She states that she could not walk until 07/18/2021, where she was given a boot. On 09/07/2021 her boot was removed. On 10/24/2021 she returned to see Dr. Lucia Gaskins as she was having foot pain and received a CT scan the next day. She states the screws in her foot were loose and her bones were not fusing. On 11/08/2021 she received another foot surgery and her plates were removed. She reports that she is walking normally now and her bones are healing. However, she is still experiencing foot, leg and back pain.    Cholesterol: Her cholesterol levels have been within normal range.  Lab Results  Component Value Date   CHOL 186 01/08/2022   HDL 53.80 01/08/2022   LDLCALC 101 (H) 01/08/2022   LDLDIRECT 115.0 03/15/2019   TRIG 156.0 (H) 01/08/2022   CHOLHDL 3 01/08/2022    Diet: She has been informed about reducing her carbohydrate intake. She consumes chia seeds daily to manage her cholesterol.   Past Medical History:  Diagnosis Date   Adenomatous colon polyp    Allergic rhinitis    Allergy    late fall, spring    Anemia    as teenager    Breast abscess 09/22/2014   recurrent   Cataract    bilateral,removed   Colon polyps    Depression    pt denies- uses venlafaxine with menopause ,pt,denies 08/23/21   Facial lesion 06/2010   removed from right side of face--precancerous   GERD (gastroesophageal reflux disease)    Hypertension    Hypoglycemia    Hypoglycemia    Osteoarthritis    knees neck and hands- OA   Pain of left heel 02/17/2017   Preventative health care 03/07/2014   Snoring 2008   negative sleep study of OSA   Tendonitis 08/16/2016   Visual disturbance    visual flashes, referred to [facility] (Normal funduscopic exam)    Past Surgical History:  Procedure Laterality Date   ABDOMINAL HYSTERECTOMY  2002   partial   CARPAL TUNNEL RELEASE  05/2010, 06/2010   bilateral--Dr Tamera Punt   CATARACT EXTRACTION Bilateral    CERVICAL FUSION  06/15/2009   right sided C6-C7 radiculopathy status post anterior cervical diskectomy and fusion at the C5-6 and C6-7 levels. Phylliss Bob, MD  CHOLECYSTECTOMY N/A 10/10/2016   Procedure: LAPAROSCOPIC CHOLECYSTECTOMY;  Surgeon: Autumn Messing III, MD;  Location: [facility];  Service: General;  Laterality: N/A;   COLONOSCOPY     DORSAL COMPARTMENT RELEASE Left 02/06/2015   Procedure: DEQUERVAIN'S RELEASE;  Surgeon: Milly Jakob, MD;  Location: [place];  Service: Orthopedics;  Laterality: Left;   EYE SURGERY Right    right eye stye removed   EYE SURGERY Bilateral    radial K surgery   FOOT SURGERY Left    removed arthritis put in plate and screws,[facility] [place]  08/12/2014 & 02/06/2015   Original break in Feb & add'l surg in Aug  Plate in Left arm   HARDWARE  REMOVAL Left 02/06/2015   Procedure: LEFT WRIST REMOVAL OF HARDWARE;  Surgeon: Milly Jakob, MD;  Location: [place];  Service: Orthopedics;  Laterality: Left;   Left wrist surgery     Feb.19th 2016   NASAL SEPTUM SURGERY     deviated septum   POLYPECTOMY     SPINE SURGERY  5 +/- yrs ago   Neck vertebrae fusion   Surgery on left ankle for fracture     age 40 years old    Family History  Problem Relation Age of Onset   Stroke Mother    Colon cancer Mother    Hypertension Mother    Hyperlipidemia Mother    Arthritis Mother        OA and rheumatoid   Heart disease Mother        MI 89   Colon polyps Mother    Alcohol abuse Father    Heart disease Father    Hypertension Father    Colon polyps Father    Arthritis Maternal Grandmother        RA and OA   Diabetes Maternal Grandmother    Heart disease Maternal Grandmother    Hypertension Maternal Grandmother    Stroke Maternal Grandmother    Heart disease Maternal Grandfather    Hypertension Maternal Grandfather    Diabetes Paternal Grandfather    Esophageal cancer Neg Hx    Rectal cancer Neg Hx    Stomach cancer Neg Hx     Social History   Socioeconomic History   Marital status: Married    Spouse name: Not on file   Number of children: Not on file   Years of education: Not on file   Highest education level: Not on file  Occupational History   Occupation: Primary school teacher for [employer]  Tobacco Use   Smoking status: Never    Passive exposure: Past ("FATHER WAS CHAIN SMOKER")   Smokeless tobacco: Never  Vaping Use   Vaping Use: Never used  Substance and Sexual Activity   Alcohol use: No   Drug use: Never   Sexual activity: Yes    Comment: lives with husband, no dietary restrictions  Other Topics Concern   Not on file  Social History Narrative   Not on file   Social Determinants of Health   Financial Resource Strain: Not on file  Food Insecurity: Not on file   Transportation Needs: Not on file  Physical Activity: Not on file  Stress: Not on file  Social Connections: Not on file  Intimate Partner Violence: Not on file    Outpatient Medications Prior to Visit  Medication Sig Dispense Refill   Ascorbic Acid (VITAMIN C) 1000 MG tablet 2 (two) times daily.     aspirin 81 MG  EC tablet Take 81 mg by mouth daily.       Cholecalciferol (VITAMIN D3) 25 MCG (1000 UT) CAPS 2,000 Units 2 (two) times daily.     fexofenadine (ALLEGRA) 180 MG tablet Take 180 mg by mouth at bedtime as needed for allergies.      gabapentin (NEURONTIN) 300 MG capsule TAKE 1 CAPSULE (300 MG TOTAL) BY MOUTH TWICE A DAY 180 capsule 3   irbesartan-hydrochlorothiazide (AVALIDE) 300-12.5 MG tablet Take 1 tablet by mouth daily. 90 tablet 3   Krill Oil 350 MG CAPS Take 350 mg by mouth daily.     methocarbamol (ROBAXIN) 500 MG tablet Take 500 mg by mouth every 6 (six) hours as needed.     Misc Natural Products (GLUCOSAMINE CHONDROITIN TRIPLE) TABS Take 1 tablet by mouth 2 (two) times daily.     OVER THE COUNTER MEDICATION daily. Shift probotic take one capsule daily     pantoprazole (PROTONIX) 40 MG tablet TAKE 1 TABLET ('40MG'$ ) BY MOUTH EVERY DAY 90 tablet 3   Soy Isoflavones 40 MG TABS Take 50 mg by mouth 2 (two) times daily.     tretinoin (RETIN-A) 0.05 % cream Apply topically at bedtime.     triamcinolone cream (KENALOG) 0.1 % Apply topically 2 (two) times daily.     Vitamins-Lipotropics (B-100 COMPLEX) TABS daily.     No facility-administered medications prior to visit.    No Known Allergies  Review of Systems  Musculoskeletal:  Positive for back pain and joint pain.       (+) foot, leg and back pain       Objective:    Physical Exam Constitutional:      General: She is not in acute distress.    Appearance: Normal appearance. She is not ill-appearing.  HENT:     Head: Normocephalic and atraumatic.     Right Ear: External ear normal.     Left Ear: External ear normal.   Eyes:     Extraocular Movements: Extraocular movements intact.     Pupils: Pupils are equal, round, and reactive to light.  Cardiovascular:     Rate and Rhythm: Normal rate and regular rhythm.     Pulses: Normal pulses.     Heart sounds: Normal heart sounds. No murmur heard.    No gallop.  Pulmonary:     Effort: Pulmonary effort is normal. No respiratory distress.     Breath sounds: Normal breath sounds. No wheezing or rales.  Skin:    General: Skin is warm and dry.  Neurological:     Mental Status: She is alert and oriented to person, place, and time.  Psychiatric:        Mood and Affect: Mood normal.        Behavior: Behavior normal.        Judgment: Judgment normal.     BP 122/78 (BP Location: Left Arm, Patient Position: Sitting, Cuff Size: Normal)   Pulse 76   Resp 20   Ht '5\' 5"'$  (1.651 m)   Wt 209 lb (94.8 kg)   SpO2 96%   BMI 34.78 kg/m  Wt Readings from Last 3 Encounters:  01/10/22 209 lb (94.8 kg)  09/06/21 200 lb (90.7 kg)  08/23/21 200 lb (90.7 kg)    Diabetic Foot Exam - Simple   No data filed    Lab Results  Component Value Date   WBC 4.4 01/08/2022   HGB 14.1 01/08/2022   HCT 40.9 01/08/2022   PLT 170.0  01/08/2022   GLUCOSE 94 01/08/2022   CHOL 186 01/08/2022   TRIG 156.0 (H) 01/08/2022   HDL 53.80 01/08/2022   LDLDIRECT 115.0 03/15/2019   LDLCALC 101 (H) 01/08/2022   ALT 20 01/08/2022   AST 20 01/08/2022   NA 143 01/08/2022   K 3.9 01/08/2022   CL 104 01/08/2022   CREATININE 1.03 01/08/2022   BUN 17 01/08/2022   CO2 33 (H) 01/08/2022   TSH 1.45 01/08/2022   INR 0.97 06/13/2009   HGBA1C 5.2 01/08/2022    Lab Results  Component Value Date   TSH 1.45 01/08/2022   Lab Results  Component Value Date   WBC 4.4 01/08/2022   HGB 14.1 01/08/2022   HCT 40.9 01/08/2022   MCV 89.6 01/08/2022   PLT 170.0 01/08/2022   Lab Results  Component Value Date   NA 143 01/08/2022   K 3.9 01/08/2022   CO2 33 (H) 01/08/2022   GLUCOSE 94  01/08/2022   BUN 17 01/08/2022   CREATININE 1.03 01/08/2022   BILITOT 0.6 01/08/2022   ALKPHOS 107 01/08/2022   AST 20 01/08/2022   ALT 20 01/08/2022   PROT 6.4 01/08/2022   ALBUMIN 4.3 01/08/2022   CALCIUM 9.5 01/08/2022   ANIONGAP 9 10/09/2016   GFR 56.84 (L) 01/08/2022   Lab Results  Component Value Date   CHOL 186 01/08/2022   Lab Results  Component Value Date   HDL 53.80 01/08/2022   Lab Results  Component Value Date   LDLCALC 101 (H) 01/08/2022   Lab Results  Component Value Date   TRIG 156.0 (H) 01/08/2022   Lab Results  Component Value Date   CHOLHDL 3 01/08/2022   Lab Results  Component Value Date   HGBA1C 5.2 01/08/2022       Assessment & Plan:   Problem List Items Addressed This Visit     HIP PAIN, RIGHT    Posterior hip pain secondary to her immobility due to her left foot surgery and being on a scooter for months. Got marginal relief from Prednisone and Robaxin but is really flared since stopping Prednisone. Will give a trial of Medrol and she will continue chiropractic and consider acupuncture      Essential hypertension    Well controlled, no changes to meds. Encouraged heart healthy diet such as the DASH diet and exercise as tolerated.       Hyperlipidemia, mixed    Encourage heart healthy diet such as MIND or DASH diet, increase exercise, avoid trans fats, simple carbohydrates and processed foods, consider a krill or fish or flaxseed oil cap daily.       Obesity - Primary   Hyperglycemia    hgba1c acceptable, minimize simple carbs. Increase exercise as tolerated.        Gout    Hydrate and monitor      Acute sciatica    Right hip. Given a shot of Depo Medrol 40 mg in office and then a slow taper medrol dosepak and she can continue her Robaxin and follow up with ortho      Relevant Medications   methocarbamol (ROBAXIN) 500 MG tablet   Meds ordered this encounter  Medications   methylPREDNISolone acetate (DEPO-MEDROL) injection  40 mg   methylPREDNISolone (MEDROL) 4 MG tablet    Sig: 6 tabs po daily x 3 d then 5 tabs po x 3 days then 4 tabs po x 3 days then 3 tabs po x 3 days then 2 tabs po x  3 days then 1 tab po x 3 days and stop.    Dispense:  64 tablet    Refill:  0   I, Penni Homans, MD, personally preformed the services described in this documentation.  All medical record entries made by the scribe were at my direction and in my presence.  I have reviewed the chart and discharge instructions (if applicable) and agree that the record reflects my personal performance and is accurate and complete. 01/10/2022.  I,Mohammed Iqbal,acting as a scribe for Penni Homans, MD.,have documented all relevant documentation on the behalf of Penni Homans, MD,as directed by  Penni Homans, MD while in the presence of Penni Homans, MD.  Penni Homans, MD

## 2022-01-10 NOTE — Assessment & Plan Note (Signed)
hgba1c acceptable, minimize simple carbs. Increase exercise as tolerated.  

## 2022-01-10 NOTE — Assessment & Plan Note (Signed)
Well controlled, no changes to meds. Encouraged heart healthy diet such as the DASH diet and exercise as tolerated.  °

## 2022-01-11 DIAGNOSIS — M5430 Sciatica, unspecified side: Secondary | ICD-10-CM | POA: Insufficient documentation

## 2022-01-11 NOTE — Assessment & Plan Note (Signed)
Right hip. Given a shot of Depo Medrol 40 mg in office and then a slow taper medrol dosepak and she can continue her Robaxin and follow up with ortho

## 2022-01-14 ENCOUNTER — Other Ambulatory Visit: Payer: Self-pay | Admitting: Family Medicine

## 2022-01-16 ENCOUNTER — Other Ambulatory Visit: Payer: Self-pay | Admitting: Family Medicine

## 2022-01-17 ENCOUNTER — Other Ambulatory Visit: Payer: Self-pay

## 2022-01-17 MED ORDER — IRBESARTAN-HYDROCHLOROTHIAZIDE 300-12.5 MG PO TABS
1.0000 | ORAL_TABLET | Freq: Every day | ORAL | 2 refills | Status: DC
Start: 2022-01-17 — End: 2022-10-14

## 2022-02-09 ENCOUNTER — Encounter: Payer: Self-pay | Admitting: Family Medicine

## 2022-03-21 ENCOUNTER — Ambulatory Visit: Payer: Self-pay | Admitting: Licensed Clinical Social Worker

## 2022-03-21 NOTE — Patient Outreach (Signed)
  Care Coordination   Initial Visit Note   03/21/2022 Name: Julie Gray MRN: [id number] DOB: 05/12/1956  Julie Gray is a 66 y.o. year old female who sees Mosie Lukes, MD for primary care. I spoke with  Faythe Casa by phone today.  What matters to the patients health and wellness today?   Patient reports no concerns or needs from Care Coordination team with health and wellness related to physical or mental heath. .    Goals Addressed             This Visit's Progress    COMPLETED: Care Coordination Activities No Follow up Required       Care Coordination Interventions: Reviewed Care Coordination Services:Declined Discussed benefits of Medicare Annual Wellness Visit: Declined           SDOH assessments and interventions completed:  No    Care Coordination Interventions Activated:  Yes  Care Coordination Interventions:  Yes, provided   Follow up plan: No further intervention required.   Encounter Outcome:  Pt. Visit Completed   Casimer Lanius, [place] [phone]

## 2022-03-21 NOTE — Patient Instructions (Signed)
Visit Information  Thank you for taking time to talk with me today. Please don't hesitate to contact me if I can be of assistance to you.   Following are the goals we discussed today:   Goals Addressed             This Visit's Progress    COMPLETED: Care Coordination Activities No Follow up Required       Care Coordination Interventions: Reviewed Care Coordination Services:Declined Discussed benefits of Medicare Annual Wellness Visit: Declined            No further follow up required: by [place] team works in collaboration with your primary care doctor.  Please call [phone] if you would like to schedule a phone appointment with a Nurse or Social work Care Coordinator to assist with navigating your physical and mental health needs.    Casimer Lanius, [place] [phone]

## 2022-06-14 DIAGNOSIS — R0981 Nasal congestion: Secondary | ICD-10-CM | POA: Diagnosis not present

## 2022-06-14 DIAGNOSIS — R059 Cough, unspecified: Secondary | ICD-10-CM | POA: Diagnosis not present

## 2022-06-14 DIAGNOSIS — H6691 Otitis media, unspecified, right ear: Secondary | ICD-10-CM | POA: Diagnosis not present

## 2022-06-14 DIAGNOSIS — R509 Fever, unspecified: Secondary | ICD-10-CM | POA: Diagnosis not present

## 2022-07-26 DIAGNOSIS — L718 Other rosacea: Secondary | ICD-10-CM | POA: Diagnosis not present

## 2022-07-26 DIAGNOSIS — L728 Other follicular cysts of the skin and subcutaneous tissue: Secondary | ICD-10-CM | POA: Diagnosis not present

## 2022-07-26 DIAGNOSIS — L300 Nummular dermatitis: Secondary | ICD-10-CM | POA: Diagnosis not present

## 2022-07-26 DIAGNOSIS — L821 Other seborrheic keratosis: Secondary | ICD-10-CM | POA: Diagnosis not present

## 2022-07-30 ENCOUNTER — Other Ambulatory Visit: Payer: Self-pay

## 2022-07-30 ENCOUNTER — Other Ambulatory Visit (INDEPENDENT_AMBULATORY_CARE_PROVIDER_SITE_OTHER): Payer: Medicare Other

## 2022-07-30 ENCOUNTER — Telehealth: Payer: Self-pay | Admitting: *Deleted

## 2022-07-30 DIAGNOSIS — E782 Mixed hyperlipidemia: Secondary | ICD-10-CM

## 2022-07-30 DIAGNOSIS — R739 Hyperglycemia, unspecified: Secondary | ICD-10-CM

## 2022-07-30 DIAGNOSIS — I10 Essential (primary) hypertension: Secondary | ICD-10-CM

## 2022-07-30 LAB — CBC WITH DIFFERENTIAL/PLATELET
Basophils Absolute: 0 10*3/uL (ref 0.0–0.1)
Basophils Relative: 0.6 % (ref 0.0–3.0)
Eosinophils Absolute: 0.1 10*3/uL (ref 0.0–0.7)
Eosinophils Relative: 3.3 % (ref 0.0–5.0)
HCT: 40.4 % (ref 36.0–46.0)
Hemoglobin: 14 g/dL (ref 12.0–15.0)
Lymphocytes Relative: 36.8 % (ref 12.0–46.0)
Lymphs Abs: 1.7 10*3/uL (ref 0.7–4.0)
MCHC: 34.8 g/dL (ref 30.0–36.0)
MCV: 88.3 fl (ref 78.0–100.0)
Monocytes Absolute: 0.4 10*3/uL (ref 0.1–1.0)
Monocytes Relative: 8 % (ref 3.0–12.0)
Neutro Abs: 2.3 10*3/uL (ref 1.4–7.7)
Neutrophils Relative %: 51.3 % (ref 43.0–77.0)
Platelets: 192 10*3/uL (ref 150.0–400.0)
RBC: 4.57 Mil/uL (ref 3.87–5.11)
RDW: 13.3 % (ref 11.5–15.5)
WBC: 4.5 10*3/uL (ref 4.0–10.5)

## 2022-07-30 LAB — LIPID PANEL
Cholesterol: 178 mg/dL (ref 0–200)
HDL: 49.8 mg/dL (ref >39.00)
LDL Cholesterol: 99 mg/dL (ref 0–99)
NonHDL: 127.82
Total CHOL/HDL Ratio: 4
Triglycerides: 143 mg/dL (ref 0.0–149.0)
VLDL: 28.6 mg/dL (ref 0.0–40.0)

## 2022-07-30 LAB — COMPREHENSIVE METABOLIC PANEL
ALT: 18 U/L (ref 0–35)
AST: 17 U/L (ref 0–37)
Albumin: 4.4 g/dL (ref 3.5–5.2)
Alkaline Phosphatase: 94 U/L (ref 39–117)
BUN: 18 mg/dL (ref 6–23)
CO2: 31 mEq/L (ref 19–32)
Calcium: 9.6 mg/dL (ref 8.4–10.5)
Chloride: 103 mEq/L (ref 96–112)
Creatinine, Ser: 1.06 mg/dL (ref 0.40–1.20)
GFR: 54.7 mL/min — ABNORMAL LOW (ref >60.00)
Glucose, Bld: 112 mg/dL — ABNORMAL HIGH (ref 70–99)
Potassium: 4 mEq/L (ref 3.5–5.1)
Sodium: 143 mEq/L (ref 135–145)
Total Bilirubin: 0.7 mg/dL (ref 0.2–1.2)
Total Protein: 6.7 g/dL (ref 6.0–8.3)

## 2022-07-30 LAB — TSH: TSH: 1.15 u[IU]/mL (ref 0.35–5.50)

## 2022-07-30 LAB — HEMOGLOBIN A1C: Hgb A1c MFr Bld: 5 % (ref 4.6–6.5)

## 2022-07-30 NOTE — Telephone Encounter (Signed)
Labs ordered future

## 2022-07-30 NOTE — Telephone Encounter (Signed)
Pt came in for lab appointment this morning. Scheduling notes says CPE labs. OV with Dr Charlett Blake will be 08/01/22.  There are no lab orders in EPIC at this time. Please place future orders. (I drew an SST and 2 lavenders).

## 2022-08-01 ENCOUNTER — Telehealth: Payer: Self-pay | Admitting: Family Medicine

## 2022-08-01 ENCOUNTER — Other Ambulatory Visit: Payer: Self-pay

## 2022-08-01 ENCOUNTER — Ambulatory Visit (INDEPENDENT_AMBULATORY_CARE_PROVIDER_SITE_OTHER): Payer: Medicare Other | Admitting: Family Medicine

## 2022-08-01 VITALS — BP 126/74 | HR 70 | Temp 97.5°F | Resp 16 | Ht 64.0 in | Wt 205.0 lb

## 2022-08-01 DIAGNOSIS — E782 Mixed hyperlipidemia: Secondary | ICD-10-CM

## 2022-08-01 DIAGNOSIS — Z0000 Encounter for general adult medical examination without abnormal findings: Secondary | ICD-10-CM

## 2022-08-01 DIAGNOSIS — I10 Essential (primary) hypertension: Secondary | ICD-10-CM

## 2022-08-01 DIAGNOSIS — M109 Gout, unspecified: Secondary | ICD-10-CM

## 2022-08-01 DIAGNOSIS — R739 Hyperglycemia, unspecified: Secondary | ICD-10-CM

## 2022-08-01 DIAGNOSIS — L719 Rosacea, unspecified: Secondary | ICD-10-CM

## 2022-08-01 DIAGNOSIS — E6609 Other obesity due to excess calories: Secondary | ICD-10-CM | POA: Diagnosis not present

## 2022-08-01 NOTE — Telephone Encounter (Signed)
Patient would like to come in a few days earlier than her CPE appt to get lab work done. She has a lab appointment for 01/28/23. Please add future labs.

## 2022-08-01 NOTE — Patient Instructions (Addendum)
Tetanus shot at pharmacy  Covid and flu boosters annually  Prevnar 20 daily  Respiratory Syncitial Virus Vaccine, RSV vaccine, Arexvy at pharmacy   Super Goop sunscreen at [employer] or online    Preventive Care 65 Years and Older, Female Preventive care refers to lifestyle choices and visits with your health care provider that can promote health and wellness. Preventive care visits are also called wellness exams. What can I expect for my preventive care visit? Counseling Your health care provider may ask you questions about your: Medical history, including: Past medical problems. Family medical history. Pregnancy and menstrual history. History of falls. Current health, including: Memory and ability to understand (cognition). Emotional well-being. Home life and relationship well-being. Sexual activity and sexual health. Lifestyle, including: Alcohol, nicotine or tobacco, and drug use. Access to firearms. Diet, exercise, and sleep habits. Work and work Statistician. Sunscreen use. Safety issues such as seatbelt and bike helmet use. Physical exam Your health care provider will check your: Height and weight. These may be used to calculate your BMI (body mass index). BMI is a measurement that tells if you are at a healthy weight. Waist circumference. This measures the distance around your waistline. This measurement also tells if you are at a healthy weight and may help predict your risk of certain diseases, such as type 2 diabetes and high blood pressure. Heart rate and blood pressure. Body temperature. Skin for abnormal spots. What immunizations do I need?  Vaccines are usually given at various ages, according to a schedule. Your health care provider will recommend vaccines for you based on your age, medical history, and lifestyle or other factors, such as travel or where you work. What tests do I need? Screening Your health care provider may recommend screening tests for  certain conditions. This may include: Lipid and cholesterol levels. Hepatitis C test. Hepatitis B test. HIV (human immunodeficiency virus) test. STI (sexually transmitted infection) testing, if you are at risk. Lung cancer screening. Colorectal cancer screening. Diabetes screening. This is done by checking your blood sugar (glucose) after you have not eaten for a while (fasting). Mammogram. Talk with your health care provider about how often you should have regular mammograms. BRCA-related cancer screening. This may be done if you have a family history of breast, ovarian, tubal, or peritoneal cancers. Bone density scan. This is done to screen for osteoporosis. Talk with your health care provider about your test results, treatment options, and if necessary, the need for more tests. Follow these instructions at home: Eating and drinking  Eat a diet that includes fresh fruits and vegetables, whole grains, lean protein, and low-fat dairy products. Limit your intake of foods with high amounts of sugar, saturated fats, and salt. Take vitamin and mineral supplements as recommended by your health care provider. Do not drink alcohol if your health care provider tells you not to drink. If you drink alcohol: Limit how much you have to 0-1 drink a day. Know how much alcohol is in your drink. In the [place] one drink equals one 12 oz bottle of beer (355 mL), one 5 oz glass of wine (148 mL), or one 1 oz glass of hard liquor (44 mL). Lifestyle Brush your teeth every morning and night with fluoride toothpaste. Floss one time each day. Exercise for at least 30 minutes 5 or more days each week. Do not use any products that contain nicotine or tobacco. These products include cigarettes, chewing tobacco, and vaping devices, such as e-cigarettes. If you need help quitting,  ask your health care provider. Do not use drugs. If you are sexually active, practice safe sex. Use a condom or other form of protection in  order to prevent STIs. Take aspirin only as told by your health care provider. Make sure that you understand how much to take and what form to take. Work with your health care provider to find out whether it is safe and beneficial for you to take aspirin daily. Ask your health care provider if you need to take a cholesterol-lowering medicine (statin). Find healthy ways to manage stress, such as: Meditation, yoga, or listening to music. Journaling. Talking to a trusted person. Spending time with friends and family. Minimize exposure to UV radiation to reduce your risk of skin cancer. Safety Always wear your seat belt while driving or riding in a vehicle. Do not drive: If you have been drinking alcohol. Do not ride with someone who has been drinking. When you are tired or distracted. While texting. If you have been using any mind-altering substances or drugs. Wear a helmet and other protective equipment during sports activities. If you have firearms in your house, make sure you follow all gun safety procedures. What's next? Visit your health care provider once a year for an annual wellness visit. Ask your health care provider how often you should have your eyes and teeth checked. Stay up to date on all vaccines. This information is not intended to replace advice given to you by your health care provider. Make sure you discuss any questions you have with your health care provider. Document Revised: 12/06/2020 Document Reviewed: 12/06/2020 Elsevier Patient Education  [place].

## 2022-08-01 NOTE — Assessment & Plan Note (Signed)
Patient encouraged to maintain heart healthy diet, regular exercise, adequate sleep. Consider daily probiotics. Take medications as prescribed. Labs ordered and reviewed. Given and reviewed copy of ACP documents from [place] Secretary of State and encouraged to complete and return 

## 2022-08-01 NOTE — Telephone Encounter (Signed)
Future Lab are ordered

## 2022-08-01 NOTE — Assessment & Plan Note (Signed)
Hydrate and monitor 

## 2022-08-01 NOTE — Assessment & Plan Note (Signed)
hgba1c acceptable, minimize simple carbs. Increase exercise as tolerated.  

## 2022-08-01 NOTE — Progress Notes (Signed)
Subjective:   By signing my name below, I, [name], attest that this documentation has been prepared under the direction and in the presence of Mosie Lukes, MD., 08/01/2022.   [name], female    DOB: Dec 10, 1955, 67 y.o.   MRN: [id number]  Chief Complaint  Patient presents with   Annual Exam    Annual Exam    HPI Patient is in today for a comprehensive physical exam and follow up on chronic medical concerns. She denies CP/ palpitations/SOB/HA/congestion/fever/ chills/GI or GU symptoms. She continues to take medications/supplements as prescribed.   Acne Rosacea Patient reports she has been experiencing acne breakouts on her face and saw a dermatologist last week while at the beach. Rosacea acne was diagnosed and she was was prescribed Doxycycline which she is currently taking daily. She suspects her acne rosacea is causing her eyes to water and she has seen ophthalmologist Dr. Renaldo Fiddler about this who prescribed eye drops.  Blood Work Patient had blood work taken on 07/30/2022 and is inquiring about her kidney function due to a reduced GFR of 54.70 mL/min. Normal range is >60 mL/min. She is also concerned about her blood glucose level which was 112 mg/dL. Normal range is 70-99 mg/dL. She has been attempting to eliminate artificial sugars from her diet and has reduced her carbohydrate intake. Lab Results  Component Value Date   HGBA1C 5.0 07/30/2022   Family History No changes to the family history.  Past Medical History:  Diagnosis Date   Adenomatous colon polyp    Allergic rhinitis    Allergy    late fall, spring    Anemia    as teenager    Breast abscess 09/22/2014   recurrent   Cataract    bilateral,removed   Colon polyps    Depression    pt denies- uses venlafaxine with menopause ,pt,denies 08/23/21   Facial lesion 06/2010   removed from right side of face--precancerous   GERD (gastroesophageal reflux disease)    Hypertension     Hypoglycemia    Hypoglycemia    Osteoarthritis    knees neck and hands- OA   Pain of left heel 02/17/2017   Preventative health care 03/07/2014   Snoring 2008   negative sleep study of OSA   Tendonitis 08/16/2016   Visual disturbance    visual flashes, referred to [facility] (Normal funduscopic exam)    Past Surgical History:  Procedure Laterality Date   ABDOMINAL HYSTERECTOMY  2002   partial   CARPAL TUNNEL RELEASE  05/2010, 06/2010   bilateral--Dr Tamera Punt   CATARACT EXTRACTION Bilateral    CERVICAL FUSION  06/15/2009   right sided C6-C7 radiculopathy status post anterior cervical diskectomy and fusion at the C5-6 and C6-7 levels. Phylliss Bob, MD   CHOLECYSTECTOMY N/A 10/10/2016   Procedure: LAPAROSCOPIC CHOLECYSTECTOMY;  Surgeon: Autumn Messing III, MD;  Location: [facility];  Service: General;  Laterality: N/A;   COLONOSCOPY     DORSAL COMPARTMENT RELEASE Left 02/06/2015   Procedure: DEQUERVAIN'S RELEASE;  Surgeon: Milly Jakob, MD;  Location: [facility];  Service: Orthopedics;  Laterality: Left;   EYE SURGERY Right    right eye stye removed   EYE SURGERY Bilateral    radial K surgery   FOOT SURGERY Left    removed arthritis put in plate and screws,[facility]  08/12/2014 & 02/06/2015   Original break in Feb & add'l surg in Aug  Plate in Left  arm   HARDWARE REMOVAL Left 02/06/2015   Procedure: LEFT WRIST REMOVAL OF HARDWARE;  Surgeon: Milly Jakob, MD;  Location: [facility];  Service: Orthopedics;  Laterality: Left;   Left wrist surgery     Feb.19th 2016   NASAL SEPTUM SURGERY     deviated septum   POLYPECTOMY     SPINE SURGERY  5 +/- yrs ago   Neck vertebrae fusion   Surgery on left ankle for fracture     age 50 years old    Family History  Problem Relation Age of Onset   Stroke Mother    Colon cancer Mother    Hypertension Mother    Hyperlipidemia Mother    Arthritis Mother        OA and rheumatoid    Heart disease Mother        MI 32   Colon polyps Mother    Alcohol abuse Father    Heart disease Father    Hypertension Father    Colon polyps Father    Arthritis Maternal Grandmother        RA and OA   Diabetes Maternal Grandmother    Heart disease Maternal Grandmother    Hypertension Maternal Grandmother    Stroke Maternal Grandmother    Heart disease Maternal Grandfather    Hypertension Maternal Grandfather    Diabetes Paternal Grandfather    Esophageal cancer Neg Hx    Rectal cancer Neg Hx    Stomach cancer Neg Hx     Social History   Socioeconomic History   Marital status: Married    Spouse name: Not on file   Number of children: Not on file   Years of education: Not on file   Highest education level: Not on file  Occupational History   Occupation: Primary school teacher for [employer]  Tobacco Use   Smoking status: Never    Passive exposure: Past ("FATHER WAS CHAIN SMOKER")   Smokeless tobacco: Never  Vaping Use   Vaping Use: Never used  Substance and Sexual Activity   Alcohol use: No   Drug use: Never   Sexual activity: Yes    Comment: lives with husband, no dietary restrictions  Other Topics Concern   Not on file  Social History Narrative   Not on file   Social Determinants of Health   Financial Resource Strain: Not on file  Food Insecurity: Not on file  Transportation Needs: Not on file  Physical Activity: Not on file  Stress: Not on file  Social Connections: Not on file  Intimate Partner Violence: Not on file    Outpatient Medications Prior to Visit  Medication Sig Dispense Refill   Ascorbic Acid (VITAMIN C) 1000 MG tablet 2 (two) times daily.     aspirin 81 MG EC tablet Take 81 mg by mouth daily.       Cholecalciferol (VITAMIN D3) 25 MCG (1000 UT) CAPS 2,000 Units 2 (two) times daily.     fexofenadine (ALLEGRA) 180 MG tablet Take 180 mg by mouth at bedtime as needed for allergies.      gabapentin (NEURONTIN) 300 MG capsule TAKE 1  CAPSULE (300 MG TOTAL) BY MOUTH TWICE A DAY 180 capsule 3   irbesartan-hydrochlorothiazide (AVALIDE) 300-12.5 MG tablet Take 1 tablet by mouth daily. 90 tablet 2   Krill Oil 350 MG CAPS Take 350 mg by mouth daily.     Misc Natural Products (GLUCOSAMINE CHONDROITIN TRIPLE) TABS Take 1 tablet by mouth 2 (two) times  daily.     OVER THE COUNTER MEDICATION daily. Shift probotic take one capsule daily     pantoprazole (PROTONIX) 40 MG tablet TAKE 1 TABLET (40MG) BY MOUTH EVERY DAY 90 tablet 3   Soy Isoflavones 40 MG TABS Take 50 mg by mouth 2 (two) times daily.     Vitamins-Lipotropics (B-100 COMPLEX) TABS daily.     methocarbamol (ROBAXIN) 500 MG tablet Take 500 mg by mouth every 6 (six) hours as needed.     methylPREDNISolone (MEDROL) 4 MG tablet 6 tabs po daily x 3 d then 5 tabs po x 3 days then 4 tabs po x 3 days then 3 tabs po x 3 days then 2 tabs po x 3 days then 1 tab po x 3 days and stop. 64 tablet 0   tretinoin (RETIN-A) 0.05 % cream Apply topically at bedtime.     triamcinolone cream (KENALOG) 0.1 % Apply topically 2 (two) times daily.     No facility-administered medications prior to visit.    No Known Allergies  Review of Systems  Constitutional:  Negative for chills and fever.  HENT:  Negative for congestion.   Respiratory:  Negative for shortness of breath.   Cardiovascular:  Negative for chest pain and palpitations.  Gastrointestinal:  Negative for abdominal pain, blood in stool, constipation, diarrhea, nausea and vomiting.  Genitourinary:  Negative for dysuria, frequency, hematuria and urgency.  Skin:           Neurological:  Negative for headaches.       Objective:    Physical Exam Constitutional:      General: She is not in acute distress.    Appearance: Normal appearance. She is normal weight. She is not ill-appearing.  HENT:     Head: Normocephalic and atraumatic.     Right Ear: Tympanic membrane, ear canal and external ear normal.     Left Ear: Tympanic  membrane, ear canal and external ear normal.     Nose: Nose normal.     Mouth/Throat:     Mouth: Mucous membranes are moist.     Pharynx: Oropharynx is clear.  Eyes:     General:        Right eye: No discharge.        Left eye: No discharge.     Extraocular Movements: Extraocular movements intact.     Right eye: No nystagmus.     Left eye: No nystagmus.     Pupils: Pupils are equal, round, and reactive to light.  Neck:     Vascular: No carotid bruit.  Cardiovascular:     Rate and Rhythm: Normal rate and regular rhythm.     Pulses: Normal pulses.     Heart sounds: Normal heart sounds. No murmur heard.    No gallop.  Pulmonary:     Effort: Pulmonary effort is normal. No respiratory distress.     Breath sounds: Normal breath sounds. No wheezing or rales.  Abdominal:     General: Bowel sounds are normal.     Palpations: Abdomen is soft.     Tenderness: There is no abdominal tenderness. There is no guarding.  Musculoskeletal:        General: Normal range of motion.     Cervical back: Normal range of motion.     Right lower leg: No edema.     Left lower leg: No edema.     Comments: Muscle strength 5/5 on upper and lower extremities.   Lymphadenopathy:  Cervical: No cervical adenopathy.  Skin:    General: Skin is warm and dry.     Comments: Acne rosacea on the face.  Neurological:     Mental Status: She is alert and oriented to person, place, and time.     Sensory: Sensation is intact.     Motor: Motor function is intact.     Coordination: Coordination is intact.     Deep Tendon Reflexes:     Reflex Scores:      Patellar reflexes are 3+ on the right side and 3+ on the left side. Psychiatric:        Mood and Affect: Mood normal.        Behavior: Behavior normal.        Judgment: Judgment normal.    BP 126/74 (BP Location: Right Arm, Patient Position: Sitting, Cuff Size: Normal)   Pulse 70   Temp (!) 97.5 F (36.4 C) (Oral)   Resp 16   Ht 5' 4"$  (1.626 m)   Wt 205  lb (93 kg)   SpO2 97%   BMI 35.19 kg/m  Wt Readings from Last 3 Encounters:  08/01/22 205 lb (93 kg)  01/10/22 209 lb (94.8 kg)  09/06/21 200 lb (90.7 kg)    Diabetic Foot Exam - Simple   No data filed    Lab Results  Component Value Date   WBC 4.5 07/30/2022   HGB 14.0 07/30/2022   HCT 40.4 07/30/2022   PLT 192.0 07/30/2022   GLUCOSE 112 (H) 07/30/2022   CHOL 178 07/30/2022   TRIG 143.0 07/30/2022   HDL 49.80 07/30/2022   LDLDIRECT 115.0 03/15/2019   LDLCALC 99 07/30/2022   ALT 18 07/30/2022   AST 17 07/30/2022   NA 143 07/30/2022   K 4.0 07/30/2022   CL 103 07/30/2022   CREATININE 1.06 07/30/2022   BUN 18 07/30/2022   CO2 31 07/30/2022   TSH 1.15 07/30/2022   INR 0.97 06/13/2009   HGBA1C 5.0 07/30/2022    Lab Results  Component Value Date   TSH 1.15 07/30/2022   Lab Results  Component Value Date   WBC 4.5 07/30/2022   HGB 14.0 07/30/2022   HCT 40.4 07/30/2022   MCV 88.3 07/30/2022   PLT 192.0 07/30/2022   Lab Results  Component Value Date   NA 143 07/30/2022   K 4.0 07/30/2022   CO2 31 07/30/2022   GLUCOSE 112 (H) 07/30/2022   BUN 18 07/30/2022   CREATININE 1.06 07/30/2022   BILITOT 0.7 07/30/2022   ALKPHOS 94 07/30/2022   AST 17 07/30/2022   ALT 18 07/30/2022   PROT 6.7 07/30/2022   ALBUMIN 4.4 07/30/2022   CALCIUM 9.6 07/30/2022   ANIONGAP 9 10/09/2016   GFR 54.70 (L) 07/30/2022   Lab Results  Component Value Date   CHOL 178 07/30/2022   Lab Results  Component Value Date   HDL 49.80 07/30/2022   Lab Results  Component Value Date   LDLCALC 99 07/30/2022   Lab Results  Component Value Date   TRIG 143.0 07/30/2022   Lab Results  Component Value Date   CHOLHDL 4 07/30/2022   Lab Results  Component Value Date   HGBA1C 5.0 07/30/2022      Assessment & Plan:  Colonoscopy: Last completed on 09/06/2021. Three 6 to 8 mm polyps in the sigmoid colon and in the descending colon. The examination was otherwise normal on direct and  retroflexion views. Repeat in 5 years.  DEXA: Last completed  on 09/04/2021. The BMD measured at Femur Neck Right is 0.998 g/cm2 with a T-score of -0.3. This patient is considered normal according to [facility]) criteria. Repeat in 2-5 years.  Mammogram: Last completed on 07/17/2021 with no mammographic evidence of malignancy. Repeat in 1-2 years.  Pap Smear: Last completed on 07/19/2021 with normal results. Repeat in 3-5 years.   Acne Rosacea: Recommended sun screen with minerals such as copper and zinc, such as Supergoop.  Advanced Directives: Encouraged patient to complete advanced care planning documents.  Healthy Lifestyle: Encouraged exercise, heart healthy diet, and hydration. Recommended complex carbohydrates, fruits and vegetables, and minimal red meats.  Immunizations: Reviewed patient's immunization history. Encouraged annual COVID-19 and Influenza immunizations as well as Prevnar 20, RSV, and Tetanus immunizations but patient refuses all vaccinations. Problem List Items Addressed This Visit     Essential hypertension    Well controlled, no changes to meds. Encouraged heart healthy diet such as the DASH diet and exercise as tolerated.        Hyperglycemia - Primary    hgba1c acceptable, minimize simple carbs. Increase exercise as tolerated.       Hyperlipidemia, mixed    Encourage heart healthy diet such as MIND or DASH diet, increase exercise, avoid trans fats, simple carbohydrates and processed foods, consider a krill or fish or flaxseed oil cap daily.       Obesity    Encouraged DASH or MIND diet, decrease po intake and increase exercise as tolerated. Needs 7-8 hours of sleep nightly. Avoid trans fats, eat small, frequent meals every 4-5 hours with lean proteins, complex carbs and healthy fats. Minimize simple carbs, high fat foods and processed foods discussed trying newer meds but declines for now      Preventative health care    Patient encouraged  to maintain heart healthy diet, regular exercise, adequate sleep. Consider daily probiotics. Take medications as prescribed Labs ordered and reviewed. Given and reviewed copy of ACP documents from [facility] Secretary of State and encouraged to complete and return       Rosacea    Diagnosed by her new dermatology and treated with antibiotics improving      No orders of the defined types were placed in this encounter.  I, Penni Homans, MD, personally preformed the services described in this documentation.  All medical record entries made by the scribe were at my direction and in my presence.  I have reviewed the chart and discharge instructions (if applicable) and agree that the record reflects my personal performance and is accurate and complete. 08/01/2022  I,Mohammed Iqbal,acting as a scribe for Penni Homans, MD.,have documented all relevant documentation on the behalf of Penni Homans, MD,as directed by  Penni Homans, MD while in the presence of Penni Homans, MD.  Penni Homans, MD

## 2022-08-04 DIAGNOSIS — L719 Rosacea, unspecified: Secondary | ICD-10-CM | POA: Insufficient documentation

## 2022-08-04 NOTE — Assessment & Plan Note (Signed)
Encourage heart healthy diet such as MIND or DASH diet, increase exercise, avoid trans fats, simple carbohydrates and processed foods, consider a krill or fish or flaxseed oil cap daily.  °

## 2022-08-04 NOTE — Assessment & Plan Note (Signed)
Well controlled, no changes to meds. Encouraged heart healthy diet such as the DASH diet and exercise as tolerated.  

## 2022-08-04 NOTE — Assessment & Plan Note (Signed)
Diagnosed by her new dermatology and treated with antibiotics improving

## 2022-08-04 NOTE — Assessment & Plan Note (Signed)
Encouraged DASH or MIND diet, decrease po intake and increase exercise as tolerated. Needs 7-8 hours of sleep nightly. Avoid trans fats, eat small, frequent meals every 4-5 hours with lean proteins, complex carbs and healthy fats. Minimize simple carbs, high fat foods and processed foods discussed trying newer meds but declines for now

## 2022-08-05 ENCOUNTER — Encounter: Payer: Self-pay | Admitting: Family Medicine

## 2022-08-20 DIAGNOSIS — Z1231 Encounter for screening mammogram for malignant neoplasm of breast: Secondary | ICD-10-CM | POA: Diagnosis not present

## 2022-08-20 DIAGNOSIS — Z7689 Persons encountering health services in other specified circumstances: Secondary | ICD-10-CM | POA: Diagnosis not present

## 2022-08-20 DIAGNOSIS — K080 Exfoliation of teeth due to systemic causes: Secondary | ICD-10-CM | POA: Diagnosis not present

## 2022-08-20 LAB — HM MAMMOGRAPHY

## 2022-08-21 DIAGNOSIS — N898 Other specified noninflammatory disorders of vagina: Secondary | ICD-10-CM | POA: Diagnosis not present

## 2022-08-21 DIAGNOSIS — R6882 Decreased libido: Secondary | ICD-10-CM | POA: Diagnosis not present

## 2022-10-07 DIAGNOSIS — J019 Acute sinusitis, unspecified: Secondary | ICD-10-CM | POA: Diagnosis not present

## 2022-10-13 ENCOUNTER — Other Ambulatory Visit: Payer: Self-pay | Admitting: Family Medicine

## 2022-11-04 DIAGNOSIS — M25562 Pain in left knee: Secondary | ICD-10-CM | POA: Diagnosis not present

## 2022-11-04 DIAGNOSIS — M25561 Pain in right knee: Secondary | ICD-10-CM | POA: Diagnosis not present

## 2022-11-09 ENCOUNTER — Other Ambulatory Visit: Payer: Self-pay | Admitting: Family Medicine

## 2022-11-09 DIAGNOSIS — M791 Myalgia, unspecified site: Secondary | ICD-10-CM

## 2022-11-09 DIAGNOSIS — I10 Essential (primary) hypertension: Secondary | ICD-10-CM

## 2022-11-09 DIAGNOSIS — Z780 Asymptomatic menopausal state: Secondary | ICD-10-CM

## 2022-11-27 DIAGNOSIS — M25561 Pain in right knee: Secondary | ICD-10-CM | POA: Diagnosis not present

## 2022-11-27 DIAGNOSIS — R262 Difficulty in walking, not elsewhere classified: Secondary | ICD-10-CM | POA: Diagnosis not present

## 2022-11-27 DIAGNOSIS — M25562 Pain in left knee: Secondary | ICD-10-CM | POA: Diagnosis not present

## 2022-11-27 DIAGNOSIS — M179 Osteoarthritis of knee, unspecified: Secondary | ICD-10-CM | POA: Diagnosis not present

## 2022-12-02 DIAGNOSIS — M1711 Unilateral primary osteoarthritis, right knee: Secondary | ICD-10-CM | POA: Diagnosis not present

## 2022-12-02 DIAGNOSIS — M170 Bilateral primary osteoarthritis of knee: Secondary | ICD-10-CM | POA: Diagnosis not present

## 2022-12-02 DIAGNOSIS — M1712 Unilateral primary osteoarthritis, left knee: Secondary | ICD-10-CM | POA: Diagnosis not present

## 2022-12-04 DIAGNOSIS — M21621 Bunionette of right foot: Secondary | ICD-10-CM | POA: Diagnosis not present

## 2022-12-04 DIAGNOSIS — M21611 Bunion of right foot: Secondary | ICD-10-CM | POA: Diagnosis not present

## 2022-12-04 DIAGNOSIS — M19071 Primary osteoarthritis, right ankle and foot: Secondary | ICD-10-CM | POA: Diagnosis not present

## 2022-12-05 DIAGNOSIS — L718 Other rosacea: Secondary | ICD-10-CM | POA: Diagnosis not present

## 2022-12-05 DIAGNOSIS — L821 Other seborrheic keratosis: Secondary | ICD-10-CM | POA: Diagnosis not present

## 2022-12-18 DIAGNOSIS — M25561 Pain in right knee: Secondary | ICD-10-CM | POA: Diagnosis not present

## 2022-12-20 ENCOUNTER — Other Ambulatory Visit: Payer: Self-pay | Admitting: Family Medicine

## 2023-01-06 DIAGNOSIS — M170 Bilateral primary osteoarthritis of knee: Secondary | ICD-10-CM | POA: Diagnosis not present

## 2023-01-06 DIAGNOSIS — M1711 Unilateral primary osteoarthritis, right knee: Secondary | ICD-10-CM | POA: Diagnosis not present

## 2023-01-06 DIAGNOSIS — M1712 Unilateral primary osteoarthritis, left knee: Secondary | ICD-10-CM | POA: Diagnosis not present

## 2023-01-10 ENCOUNTER — Other Ambulatory Visit: Payer: Self-pay | Admitting: Family Medicine

## 2023-01-23 DIAGNOSIS — M1711 Unilateral primary osteoarthritis, right knee: Secondary | ICD-10-CM | POA: Diagnosis not present

## 2023-01-23 DIAGNOSIS — M170 Bilateral primary osteoarthritis of knee: Secondary | ICD-10-CM | POA: Diagnosis not present

## 2023-01-23 DIAGNOSIS — S83231A Complex tear of medial meniscus, current injury, right knee, initial encounter: Secondary | ICD-10-CM | POA: Diagnosis not present

## 2023-01-23 DIAGNOSIS — M1712 Unilateral primary osteoarthritis, left knee: Secondary | ICD-10-CM | POA: Diagnosis not present

## 2023-01-23 DIAGNOSIS — G8911 Acute pain due to trauma: Secondary | ICD-10-CM | POA: Diagnosis not present

## 2023-01-23 DIAGNOSIS — S83271A Complex tear of lateral meniscus, current injury, right knee, initial encounter: Secondary | ICD-10-CM | POA: Diagnosis not present

## 2023-01-23 DIAGNOSIS — S83232A Complex tear of medial meniscus, current injury, left knee, initial encounter: Secondary | ICD-10-CM | POA: Diagnosis not present

## 2023-01-23 DIAGNOSIS — S83272A Complex tear of lateral meniscus, current injury, left knee, initial encounter: Secondary | ICD-10-CM | POA: Diagnosis not present

## 2023-01-28 ENCOUNTER — Other Ambulatory Visit: Payer: Medicare Other

## 2023-01-29 DIAGNOSIS — M6281 Muscle weakness (generalized): Secondary | ICD-10-CM | POA: Diagnosis not present

## 2023-01-29 DIAGNOSIS — M25661 Stiffness of right knee, not elsewhere classified: Secondary | ICD-10-CM | POA: Diagnosis not present

## 2023-01-29 DIAGNOSIS — M25662 Stiffness of left knee, not elsewhere classified: Secondary | ICD-10-CM | POA: Diagnosis not present

## 2023-01-30 ENCOUNTER — Encounter: Payer: Medicare Other | Admitting: Family Medicine

## 2023-02-04 ENCOUNTER — Other Ambulatory Visit: Payer: Medicare Other

## 2023-02-06 ENCOUNTER — Encounter: Payer: Medicare Other | Admitting: Family Medicine

## 2023-02-19 DIAGNOSIS — M25561 Pain in right knee: Secondary | ICD-10-CM | POA: Diagnosis not present

## 2023-02-19 DIAGNOSIS — K080 Exfoliation of teeth due to systemic causes: Secondary | ICD-10-CM | POA: Diagnosis not present

## 2023-02-19 DIAGNOSIS — M25562 Pain in left knee: Secondary | ICD-10-CM | POA: Diagnosis not present

## 2023-03-12 ENCOUNTER — Encounter: Payer: Self-pay | Admitting: Family Medicine

## 2023-03-20 DIAGNOSIS — G8918 Other acute postprocedural pain: Secondary | ICD-10-CM | POA: Diagnosis not present

## 2023-03-20 DIAGNOSIS — M21621 Bunionette of right foot: Secondary | ICD-10-CM | POA: Diagnosis not present

## 2023-03-20 DIAGNOSIS — M2011 Hallux valgus (acquired), right foot: Secondary | ICD-10-CM | POA: Diagnosis not present

## 2023-03-20 DIAGNOSIS — M205X1 Other deformities of toe(s) (acquired), right foot: Secondary | ICD-10-CM | POA: Diagnosis not present

## 2023-03-20 DIAGNOSIS — M2021 Hallux rigidus, right foot: Secondary | ICD-10-CM | POA: Diagnosis not present

## 2023-04-07 DIAGNOSIS — M25561 Pain in right knee: Secondary | ICD-10-CM | POA: Diagnosis not present

## 2023-05-05 DIAGNOSIS — M2021 Hallux rigidus, right foot: Secondary | ICD-10-CM | POA: Diagnosis not present

## 2023-05-11 ENCOUNTER — Other Ambulatory Visit: Payer: Self-pay | Admitting: Family Medicine

## 2023-05-11 DIAGNOSIS — I10 Essential (primary) hypertension: Secondary | ICD-10-CM

## 2023-05-11 DIAGNOSIS — M791 Myalgia, unspecified site: Secondary | ICD-10-CM

## 2023-05-11 DIAGNOSIS — Z780 Asymptomatic menopausal state: Secondary | ICD-10-CM

## 2023-07-09 DIAGNOSIS — M25461 Effusion, right knee: Secondary | ICD-10-CM | POA: Diagnosis not present

## 2023-07-09 DIAGNOSIS — M25462 Effusion, left knee: Secondary | ICD-10-CM | POA: Diagnosis not present

## 2023-07-19 ENCOUNTER — Other Ambulatory Visit: Payer: Self-pay | Admitting: Family Medicine

## 2023-08-05 ENCOUNTER — Encounter: Payer: Self-pay | Admitting: Family Medicine

## 2023-08-05 ENCOUNTER — Other Ambulatory Visit (INDEPENDENT_AMBULATORY_CARE_PROVIDER_SITE_OTHER): Payer: Medicare Other

## 2023-08-05 ENCOUNTER — Encounter: Payer: Medicare Other | Admitting: Family Medicine

## 2023-08-05 DIAGNOSIS — R739 Hyperglycemia, unspecified: Secondary | ICD-10-CM | POA: Diagnosis not present

## 2023-08-05 DIAGNOSIS — E782 Mixed hyperlipidemia: Secondary | ICD-10-CM | POA: Diagnosis not present

## 2023-08-05 DIAGNOSIS — I10 Essential (primary) hypertension: Secondary | ICD-10-CM

## 2023-08-05 LAB — LIPID PANEL
Cholesterol: 159 mg/dL (ref 0–200)
HDL: 51.6 mg/dL (ref >39.00)
LDL Cholesterol: 82 mg/dL (ref 0–99)
NonHDL: 107.18
Total CHOL/HDL Ratio: 3
Triglycerides: 124 mg/dL (ref 0.0–149.0)
VLDL: 24.8 mg/dL (ref 0.0–40.0)

## 2023-08-05 LAB — COMPREHENSIVE METABOLIC PANEL
ALT: 25 U/L (ref 0–35)
AST: 22 U/L (ref 0–37)
Albumin: 3.8 g/dL (ref 3.5–5.2)
Alkaline Phosphatase: 105 U/L (ref 39–117)
BUN: 15 mg/dL (ref 6–23)
CO2: 30 meq/L (ref 19–32)
Calcium: 9.2 mg/dL (ref 8.4–10.5)
Chloride: 104 meq/L (ref 96–112)
Creatinine, Ser: 0.84 mg/dL (ref 0.40–1.20)
GFR: 71.8 mL/min (ref >60.00)
Glucose, Bld: 102 mg/dL — ABNORMAL HIGH (ref 70–99)
Potassium: 4 meq/L (ref 3.5–5.1)
Sodium: 142 meq/L (ref 135–145)
Total Bilirubin: 0.5 mg/dL (ref 0.2–1.2)
Total Protein: 6.2 g/dL (ref 6.0–8.3)

## 2023-08-05 LAB — CBC WITH DIFFERENTIAL/PLATELET
Basophils Absolute: 0 10*3/uL (ref 0.0–0.1)
Basophils Relative: 0.6 % (ref 0.0–3.0)
Eosinophils Absolute: 0.2 10*3/uL (ref 0.0–0.7)
Eosinophils Relative: 5.2 % — ABNORMAL HIGH (ref 0.0–5.0)
HCT: 37.9 % (ref 36.0–46.0)
Hemoglobin: 12.8 g/dL (ref 12.0–15.0)
Lymphocytes Relative: 32.5 % (ref 12.0–46.0)
Lymphs Abs: 1.3 10*3/uL (ref 0.7–4.0)
MCHC: 33.6 g/dL (ref 30.0–36.0)
MCV: 88.2 fL (ref 78.0–100.0)
Monocytes Absolute: 0.3 10*3/uL (ref 0.1–1.0)
Monocytes Relative: 8.7 % (ref 3.0–12.0)
Neutro Abs: 2.1 10*3/uL (ref 1.4–7.7)
Neutrophils Relative %: 53 % (ref 43.0–77.0)
Platelets: 227 10*3/uL (ref 150.0–400.0)
RBC: 4.3 Mil/uL (ref 3.87–5.11)
RDW: 12.9 % (ref 11.5–15.5)
WBC: 4 10*3/uL (ref 4.0–10.5)

## 2023-08-05 LAB — TSH: TSH: 1.32 u[IU]/mL (ref 0.35–5.50)

## 2023-08-05 LAB — HEMOGLOBIN A1C: Hgb A1c MFr Bld: 5.2 % (ref 4.6–6.5)

## 2023-08-05 NOTE — Addendum Note (Signed)
Addended by: Mervin Kung A on: 08/05/2023 07:16 AM   Modules accepted: Orders

## 2023-08-06 NOTE — Assessment & Plan Note (Signed)
Hydrate and monitor

## 2023-08-06 NOTE — Assessment & Plan Note (Signed)
hgba1c acceptable, minimize simple carbs. Increase exercise as tolerated.

## 2023-08-06 NOTE — Assessment & Plan Note (Addendum)
Encouraged DASH or MIND diet, decrease po intake and increase exercise as tolerated. Needs 7-8 hours of sleep nightly. Avoid trans fats, eat small, frequent meals every 4-5 hours with lean proteins, complex carbs and healthy fats. Minimize simple carbs, high fat foods and processed foods

## 2023-08-06 NOTE — Assessment & Plan Note (Signed)
Patient encouraged to maintain heart healthy diet, regular exercise, adequate sleep. Consider daily probiotics. Take medications as prescribed Labs ordered and reviewed. Given and reviewed copy of ACP documents from [facility] Secretary of [place] and encouraged to complete and return  Pap 2017 Colonoscopy 08/2021 repeat in 5 years Mgm 07/2022 repeat every 1-2 years Dexa 2023, normal repeat in 2028

## 2023-08-06 NOTE — Assessment & Plan Note (Signed)
Encourage heart healthy diet such as MIND or DASH diet, increase exercise, avoid trans fats, simple carbohydrates and processed foods, consider a krill or fish or flaxseed oil cap daily.

## 2023-08-06 NOTE — Assessment & Plan Note (Signed)
Well controlled, no changes to meds. Encouraged heart healthy diet such as the DASH diet and exercise as tolerated.

## 2023-08-07 ENCOUNTER — Ambulatory Visit (INDEPENDENT_AMBULATORY_CARE_PROVIDER_SITE_OTHER): Payer: Medicare Other | Admitting: Family Medicine

## 2023-08-07 ENCOUNTER — Encounter: Payer: Self-pay | Admitting: Family Medicine

## 2023-08-07 VITALS — BP 124/72 | HR 93 | Temp 98.6°F | Resp 18 | Ht 64.0 in | Wt 205.2 lb

## 2023-08-07 DIAGNOSIS — I10 Essential (primary) hypertension: Secondary | ICD-10-CM

## 2023-08-07 DIAGNOSIS — Z0000 Encounter for general adult medical examination without abnormal findings: Secondary | ICD-10-CM

## 2023-08-07 DIAGNOSIS — M25561 Pain in right knee: Secondary | ICD-10-CM

## 2023-08-07 DIAGNOSIS — E6609 Other obesity due to excess calories: Secondary | ICD-10-CM

## 2023-08-07 DIAGNOSIS — R739 Hyperglycemia, unspecified: Secondary | ICD-10-CM

## 2023-08-07 DIAGNOSIS — E782 Mixed hyperlipidemia: Secondary | ICD-10-CM | POA: Diagnosis not present

## 2023-08-07 DIAGNOSIS — M109 Gout, unspecified: Secondary | ICD-10-CM

## 2023-08-07 DIAGNOSIS — M25562 Pain in left knee: Secondary | ICD-10-CM

## 2023-08-07 DIAGNOSIS — G8929 Other chronic pain: Secondary | ICD-10-CM

## 2023-08-07 NOTE — Patient Instructions (Addendum)
Oat bran fiber or Psyllium fiber daily online at [employer], [employer] calcium intake of 1200 to 1500 mg daily, divided into roughly 3 doses. Best source is the diet and a single dairy serving is about 500 mg, a supplement of calcium citrate once or twice daily to balance diet is fine if not getting enough in diet. Also need Vitamin D 2000 IU caps, 1 cap daily if not already taking vitamin D. Also recommend weight baring exercise on hips and upper body to keep bones strong   Preventive Care 65 Years and Older, Female Preventive care refers to lifestyle choices and visits with your health care provider that can promote health and wellness. Preventive care visits are also called wellness exams. What can I expect for my preventive care visit? Counseling Your health care provider may ask you questions about your: Medical history, including: Past medical problems. Family medical history. Pregnancy and menstrual history. History of falls. Current health, including: Memory and ability to understand (cognition). Emotional well-being. Home life and relationship well-being. Sexual activity and sexual health. Lifestyle, including: Alcohol, nicotine or tobacco, and drug use. Access to firearms. Diet, exercise, and sleep habits. Work and work Astronomer. Sunscreen use. Safety issues such as seatbelt and bike helmet use. Physical exam Your health care provider will check your: Height and weight. These may be used to calculate your BMI (body mass index). BMI is a measurement that tells if you are at a healthy weight. Waist circumference. This measures the distance around your waistline. This measurement also tells if you are at a healthy weight and may help predict your risk of certain diseases, such as type 2 diabetes and high blood pressure. Heart rate and blood pressure. Body temperature. Skin for abnormal spots. What immunizations do I need?  Vaccines are usually given at various  ages, according to a schedule. Your health care provider will recommend vaccines for you based on your age, medical history, and lifestyle or other factors, such as travel or where you work. What tests do I need? Screening Your health care provider may recommend screening tests for certain conditions. This may include: Lipid and cholesterol levels. Hepatitis C test. Hepatitis B test. HIV (human immunodeficiency virus) test. STI (sexually transmitted infection) testing, if you are at risk. Lung cancer screening. Colorectal cancer screening. Diabetes screening. This is done by checking your blood sugar (glucose) after you have not eaten for a while (fasting). Mammogram. Talk with your health care provider about how often you should have regular mammograms. BRCA-related cancer screening. This may be done if you have a family history of breast, ovarian, tubal, or peritoneal cancers. Bone density scan. This is done to screen for osteoporosis. Talk with your health care provider about your test results, treatment options, and if necessary, the need for more tests. Follow these instructions at home: Eating and drinking  Eat a diet that includes fresh fruits and vegetables, whole grains, lean protein, and low-fat dairy products. Limit your intake of foods with high amounts of sugar, saturated fats, and salt. Take vitamin and mineral supplements as recommended by your health care provider. Do not drink alcohol if your health care provider tells you not to drink. If you drink alcohol: Limit how much you have to 0-1 drink a day. Know how much alcohol is in your drink. In the [place] one drink equals one 12 oz bottle of beer (355 mL), one 5 oz glass of wine (148 mL), or one 1 oz glass of hard liquor (  44 mL). Lifestyle Brush your teeth every morning and night with fluoride toothpaste. Floss one time each day. Exercise for at least 30 minutes 5 or more days each week. Do not use any products that  contain nicotine or tobacco. These products include cigarettes, chewing tobacco, and vaping devices, such as e-cigarettes. If you need help quitting, ask your health care provider. Do not use drugs. If you are sexually active, practice safe sex. Use a condom or other form of protection in order to prevent STIs. Take aspirin only as told by your health care provider. Make sure that you understand how much to take and what form to take. Work with your health care provider to find out whether it is safe and beneficial for you to take aspirin daily. Ask your health care provider if you need to take a cholesterol-lowering medicine (statin). Find healthy ways to manage stress, such as: Meditation, yoga, or listening to music. Journaling. Talking to a trusted person. Spending time with friends and family. Minimize exposure to UV radiation to reduce your risk of skin cancer. Safety Always wear your seat belt while driving or riding in a vehicle. Do not drive: If you have been drinking alcohol. Do not ride with someone who has been drinking. When you are tired or distracted. While texting. If you have been using any mind-altering substances or drugs. Wear a helmet and other protective equipment during sports activities. If you have firearms in your house, make sure you follow all gun safety procedures. What's next? Visit your health care provider once a year for an annual wellness visit. Ask your health care provider how often you should have your eyes and teeth checked. Stay up to date on all vaccines. This information is not intended to replace advice given to you by your health care provider. Make sure you discuss any questions you have with your health care provider. Document Revised: 12/06/2020 Document Reviewed: 12/06/2020 Elsevier Patient Education  2024 [employer].

## 2023-08-08 ENCOUNTER — Encounter: Payer: Self-pay | Admitting: Family Medicine

## 2023-08-08 DIAGNOSIS — M1711 Unilateral primary osteoarthritis, right knee: Secondary | ICD-10-CM | POA: Diagnosis not present

## 2023-08-08 DIAGNOSIS — M170 Bilateral primary osteoarthritis of knee: Secondary | ICD-10-CM | POA: Diagnosis not present

## 2023-08-08 DIAGNOSIS — M1712 Unilateral primary osteoarthritis, left knee: Secondary | ICD-10-CM | POA: Diagnosis not present

## 2023-08-08 NOTE — Progress Notes (Signed)
Subjective:    [name], female    DOB: 14-Dec-1955, 68 y.o.   MRN: [id number]  Chief Complaint  Patient presents with   Annual Exam    HPI Discussed the use of AI scribe software for clinical note transcription with the patient, who gave verbal consent to proceed.  History of Present Illness   The patient, with a history of foot surgery and family history of dementia, presents with sinus congestion that started a few days ago. The patient describes the mucus as initially yellow but has since become clear and gooey. The patient has been taking over-the-counter medication including Mucinex and a NyQuil-like product, which seem to have helped with the congestion. The patient denies any fever, chills, ear pain, or throat pain.  The patient also reports frequent urination, up to three times a night, and believes she is not drinking enough water daily. The patient has been taking chia seeds and drinking cherry juice daily, which seems to stimulate bowel movements. The patient describes her stools as loose and frequent, occurring up to three times a day.  The patient also discusses ongoing knee issues, which have required multiple fluid drainages and surgeries due to arthritis, torn meniscus, and damaged cartilage. Despite the surgeries, the patient continues to experience fluid build-up in the knees and is considering gel injections to alleviate the issue. The patient expresses a desire to delay knee replacement surgery as long as possible.        Past Medical History:  Diagnosis Date   Adenomatous colon polyp    Allergic rhinitis    Allergy    late fall, spring    Anemia    as teenager    Breast abscess 09/22/2014   recurrent   Cataract    bilateral,removed   Colon polyps    Depression    pt denies- uses venlafaxine with menopause ,pt,denies 08/23/21   Facial lesion 06/2010   removed from right side of face--precancerous   GERD (gastroesophageal reflux disease)     Hypertension    Hypoglycemia    Hypoglycemia    Osteoarthritis    knees neck and hands- OA   Pain of left heel 02/17/2017   Preventative health care 03/07/2014   Snoring 2008   negative sleep study of OSA   Tendonitis 08/16/2016   Visual disturbance    visual flashes, referred to [facility] (Normal funduscopic exam)    Past Surgical History:  Procedure Laterality Date   ABDOMINAL HYSTERECTOMY  2002   partial   CARPAL TUNNEL RELEASE  05/2010, 06/2010   bilateral--Dr Ave Filter   CATARACT EXTRACTION Bilateral    CERVICAL FUSION  06/15/2009   right sided C6-C7 radiculopathy status post anterior cervical diskectomy and fusion at the C5-6 and C6-7 levels. Estill Bamberg, MD   CHOLECYSTECTOMY N/A 10/10/2016   Procedure: LAPAROSCOPIC CHOLECYSTECTOMY;  Surgeon: Chevis Pretty III, MD;  Location: [facility];  Service: General;  Laterality: N/A;   COLONOSCOPY     DORSAL COMPARTMENT RELEASE Left 02/06/2015   Procedure: DEQUERVAIN'S RELEASE;  Surgeon: Mack Hook, MD;  Location: [facility];  Service: Orthopedics;  Laterality: Left;   EYE SURGERY Right    right eye stye removed   EYE SURGERY Bilateral    radial K surgery   FOOT SURGERY Left    removed arthritis put in plate and screws,Nov 3,2022   FRACTURE SURGERY  08/12/2014 & 02/06/2015   Original break in Feb & add'l surg in Aug  Plate in Left arm   HARDWARE REMOVAL Left 02/06/2015   Procedure: LEFT WRIST REMOVAL OF HARDWARE;  Surgeon: Mack Hook, MD;  Location: [facility];  Service: Orthopedics;  Laterality: Left;   Left wrist surgery     Feb.19th 2016   NASAL SEPTUM SURGERY     deviated septum   POLYPECTOMY     SPINE SURGERY  5 +/- yrs ago   Neck vertebrae fusion   Surgery on left ankle for fracture     age 47 years old    Family History  Problem Relation Age of Onset   Stroke Mother    Colon cancer Mother    Hypertension Mother    Hyperlipidemia Mother    Arthritis Mother        OA  and rheumatoid   Heart disease Mother        MI 40   Colon polyps Mother    Alcohol abuse Father    Heart disease Father    Hypertension Father    Colon polyps Father    Arthritis Maternal Grandmother        RA and OA   Diabetes Maternal Grandmother    Heart disease Maternal Grandmother    Hypertension Maternal Grandmother    Stroke Maternal Grandmother    Heart disease Maternal Grandfather    Hypertension Maternal Grandfather    Diabetes Paternal Grandfather    Esophageal cancer Neg Hx    Rectal cancer Neg Hx    Stomach cancer Neg Hx     Social History   Socioeconomic History   Marital status: Married    Spouse name: Not on file   Number of children: Not on file   Years of education: Not on file   Highest education level: Not on file  Occupational History   Occupation: Writer for commercial businesses  Tobacco Use   Smoking status: Never    Passive exposure: Past ("FATHER WAS CHAIN SMOKER")   Smokeless tobacco: Never  Vaping Use   Vaping status: Never Used  Substance and Sexual Activity   Alcohol use: No   Drug use: Never   Sexual activity: Yes    Comment: lives with husband, no dietary restrictions  Other Topics Concern   Not on file  Social History Narrative   Not on file   Social Drivers of Health   Financial Resource Strain: Not on file  Food Insecurity: Not on file  Transportation Needs: Not on file  Physical Activity: Not on file  Stress: Not on file  Social Connections: Not on file  Intimate Partner Violence: Not on file    Outpatient Medications Prior to Visit  Medication Sig Dispense Refill   Ascorbic Acid (VITAMIN C) 1000 MG tablet 2 (two) times daily.     aspirin 81 MG EC tablet Take 81 mg by mouth daily.       Cholecalciferol (VITAMIN D3) 25 MCG (1000 UT) CAPS 2,000 Units 2 (two) times daily.     fexofenadine (ALLEGRA) 180 MG tablet Take 180 mg by mouth at bedtime as needed for allergies.      gabapentin (NEURONTIN) 300 MG  capsule TAKE 1 CAPSULE(300 MG) BY MOUTH TWICE DAILY 180 capsule 1   irbesartan-hydrochlorothiazide (AVALIDE) 300-12.5 MG tablet TAKE 1 TABLET BY MOUTH DAILY 90 tablet 1   Krill Oil 350 MG CAPS Take 350 mg by mouth daily.     Misc Natural Products (GLUCOSAMINE CHONDROITIN TRIPLE) TABS Take 1 tablet by mouth 2 (two) times  daily.     OVER THE COUNTER MEDICATION daily. Shift probotic take one capsule daily     pantoprazole (PROTONIX) 40 MG tablet TAKE 1 TABLET BY MOUTH DAILY 90 tablet 3   Soy Isoflavones 40 MG TABS Take 50 mg by mouth 2 (two) times daily.     Vitamins-Lipotropics (B-100 COMPLEX) TABS daily.     No facility-administered medications prior to visit.    No Known Allergies  Review of Systems  Constitutional:  Negative for chills, fever and malaise/fatigue.  HENT:  Positive for congestion. Negative for hearing loss.   Eyes:  Negative for discharge.  Respiratory:  Positive for cough and sputum production. Negative for shortness of breath.   Cardiovascular:  Negative for chest pain, palpitations and leg swelling.  Gastrointestinal:  Negative for abdominal pain, blood in stool, constipation, diarrhea, heartburn, nausea and vomiting.  Genitourinary:  Negative for dysuria, frequency, hematuria and urgency.  Musculoskeletal:  Positive for joint pain and myalgias. Negative for back pain and falls.  Skin:  Negative for rash.  Neurological:  Negative for dizziness, sensory change, loss of consciousness, weakness and headaches.  Endo/Heme/Allergies:  Negative for environmental allergies. Does not bruise/bleed easily.  Psychiatric/Behavioral:  Negative for depression and suicidal ideas. The patient is not nervous/anxious and does not have insomnia.        Objective:    Physical Exam Constitutional:      General: She is not in acute distress.    Appearance: Normal appearance. She is not diaphoretic.  HENT:     Head: Normocephalic and atraumatic.     Right Ear: Tympanic membrane, ear  canal and external ear normal.     Left Ear: Tympanic membrane, ear canal and external ear normal.     Nose: Nose normal.     Mouth/Throat:     Mouth: Mucous membranes are moist.     Pharynx: Oropharynx is clear. No oropharyngeal exudate.  Eyes:     General: No scleral icterus.       Right eye: No discharge.        Left eye: No discharge.     Conjunctiva/sclera: Conjunctivae normal.     Pupils: Pupils are equal, round, and reactive to light.  Neck:     Thyroid: No thyromegaly.  Cardiovascular:     Rate and Rhythm: Normal rate and regular rhythm.     Heart sounds: Normal heart sounds. No murmur heard. Pulmonary:     Effort: Pulmonary effort is normal. No respiratory distress.     Breath sounds: Normal breath sounds. No wheezing or rales.  Abdominal:     General: Bowel sounds are normal. There is no distension.     Palpations: Abdomen is soft. There is no mass.     Tenderness: There is no abdominal tenderness.  Musculoskeletal:        General: No tenderness. Normal range of motion.     Cervical back: Normal range of motion and neck supple.  Lymphadenopathy:     Cervical: No cervical adenopathy.  Skin:    General: Skin is warm and dry.     Findings: No rash.  Neurological:     General: No focal deficit present.     Mental Status: She is alert and oriented to person, place, and time.     Cranial Nerves: No cranial nerve deficit.     Coordination: Coordination normal.     Deep Tendon Reflexes: Reflexes are normal and symmetric. Reflexes normal.  Psychiatric:  Mood and Affect: Mood normal.        Behavior: Behavior normal.        Thought Content: Thought content normal.        Judgment: Judgment normal.     BP 124/72 (BP Location: Right Arm, Patient Position: Sitting, Cuff Size: Large)   Pulse 93   Temp 98.6 F (37 C) (Oral)   Resp 18   Ht 5\' 4"  (1.626 m)   Wt 205 lb 3.2 oz (93.1 kg)   SpO2 100%   BMI 35.22 kg/m  Wt Readings from Last 3 Encounters:  08/07/23  205 lb 3.2 oz (93.1 kg)  08/01/22 205 lb (93 kg)  01/10/22 209 lb (94.8 kg)    Diabetic Foot Exam - Simple   No data filed    Lab Results  Component Value Date   WBC 4.0 08/05/2023   HGB 12.8 08/05/2023   HCT 37.9 08/05/2023   PLT 227.0 08/05/2023   GLUCOSE 102 (H) 08/05/2023   CHOL 159 08/05/2023   TRIG 124.0 08/05/2023   HDL 51.60 08/05/2023   LDLDIRECT 115.0 03/15/2019   LDLCALC 82 08/05/2023   ALT 25 08/05/2023   AST 22 08/05/2023   NA 142 08/05/2023   K 4.0 08/05/2023   CL 104 08/05/2023   CREATININE 0.84 08/05/2023   BUN 15 08/05/2023   CO2 30 08/05/2023   TSH 1.32 08/05/2023   INR 0.97 06/13/2009   HGBA1C 5.2 08/05/2023    Lab Results  Component Value Date   TSH 1.32 08/05/2023   Lab Results  Component Value Date   WBC 4.0 08/05/2023   HGB 12.8 08/05/2023   HCT 37.9 08/05/2023   MCV 88.2 08/05/2023   PLT 227.0 08/05/2023   Lab Results  Component Value Date   NA 142 08/05/2023   K 4.0 08/05/2023   CO2 30 08/05/2023   GLUCOSE 102 (H) 08/05/2023   BUN 15 08/05/2023   CREATININE 0.84 08/05/2023   BILITOT 0.5 08/05/2023   ALKPHOS 105 08/05/2023   AST 22 08/05/2023   ALT 25 08/05/2023   PROT 6.2 08/05/2023   ALBUMIN 3.8 08/05/2023   CALCIUM 9.2 08/05/2023   ANIONGAP 9 10/09/2016   GFR 71.80 08/05/2023   Lab Results  Component Value Date   CHOL 159 08/05/2023   Lab Results  Component Value Date   HDL 51.60 08/05/2023   Lab Results  Component Value Date   LDLCALC 82 08/05/2023   Lab Results  Component Value Date   TRIG 124.0 08/05/2023   Lab Results  Component Value Date   CHOLHDL 3 08/05/2023   Lab Results  Component Value Date   HGBA1C 5.2 08/05/2023       Assessment & Plan:  Preventative health care Assessment & Plan: Patient encouraged to maintain heart healthy diet, regular exercise, adequate sleep. Consider daily probiotics. Take medications as prescribed Labs ordered and reviewed. Given and reviewed copy of ACP  documents from [facility] Secretary of State and encouraged to complete and return  Pap 2017 Colonoscopy 08/2021 repeat in 5 years Mgm 07/2022 repeat every 1-2 years Dexa 2023, normal repeat in 2028   Essential hypertension Assessment & Plan: Well controlled, no changes to meds. Encouraged heart healthy diet such as the DASH diet and exercise as tolerated.    Orders: -     CBC with Differential/Platelet; Future -     Comprehensive metabolic panel; Future -     TSH; Future  Gout, unspecified cause, unspecified chronicity, unspecified site  Assessment & Plan: Hydrate and monitor   Orders: -     Uric acid; Future  Hyperglycemia Assessment & Plan: hgba1c acceptable, minimize simple carbs. Increase exercise as tolerated.   Orders: -     Hemoglobin A1c; Future  Hyperlipidemia, mixed Assessment & Plan: Encourage heart healthy diet such as MIND or DASH diet, increase exercise, avoid trans fats, simple carbohydrates and processed foods, consider a krill or fish or flaxseed oil cap daily.   Orders: -     Lipid panel; Future  Obesity due to excess calories without serious comorbidity, unspecified class Assessment & Plan: Encouraged DASH or MIND diet, decrease po intake and increase exercise as tolerated. Needs 7-8 hours of sleep nightly. Avoid trans fats, eat small, frequent meals every 4-5 hours with lean proteins, complex carbs and healthy fats. Minimize simple carbs, high fat foods and processed foods   Chronic pain of both knees Assessment & Plan: Following with ortho, encouraged to stay as active as tolerated     Assessment and Plan    Sinusitis Recent onset of sinus congestion and headache, possibly related to allergies. No fever, ear pain, or throat pain. Currently self-managing with over-the-counter medications. -Continue current management. -Consider antibiotic if symptoms persist or worsen.  Knee Osteoarthritis Chronic knee pain and swelling, with recent surgeries for  torn meniscus and cartilage damage. Persistent fluid accumulation despite multiple aspirations and surgical intervention. -Aspiration of knee fluid scheduled for tomorrow. -Consider gel injection pending insurance approval.  Dehydration Possible dehydration contributing to thickened mucus and sinus symptoms. -Increase water intake to 10 ounces every 1-2 hours.  General Health Maintenance -Last Pap smear in 2017, need to confirm date of most recent Pap smear. -DEXA scan in 2023 was normal, next scan due in 2025. -Continue calcium intake, consider adding psyllium fiber or oat bran fiber for bowel regularity. -Continue shingles vaccination regimen. -Consider annual flu and COVID vaccinations, new RSV vaccination, and Prevnar 20 for pneumococcal pneumonia. -Update tetanus vaccination as needed.         Danise Edge, MD

## 2023-08-08 NOTE — Assessment & Plan Note (Signed)
Following with ortho, encouraged to stay as active as tolerated

## 2023-08-22 DIAGNOSIS — L738 Other specified follicular disorders: Secondary | ICD-10-CM | POA: Diagnosis not present

## 2023-08-22 DIAGNOSIS — D485 Neoplasm of uncertain behavior of skin: Secondary | ICD-10-CM | POA: Diagnosis not present

## 2023-08-22 DIAGNOSIS — L281 Prurigo nodularis: Secondary | ICD-10-CM | POA: Diagnosis not present

## 2023-08-22 DIAGNOSIS — L815 Leukoderma, not elsewhere classified: Secondary | ICD-10-CM | POA: Diagnosis not present

## 2023-08-22 DIAGNOSIS — L821 Other seborrheic keratosis: Secondary | ICD-10-CM | POA: Diagnosis not present

## 2023-08-22 DIAGNOSIS — L309 Dermatitis, unspecified: Secondary | ICD-10-CM | POA: Diagnosis not present

## 2023-08-27 ENCOUNTER — Encounter: Payer: Self-pay | Admitting: Family Medicine

## 2023-08-27 DIAGNOSIS — Z01419 Encounter for gynecological examination (general) (routine) without abnormal findings: Secondary | ICD-10-CM | POA: Diagnosis not present

## 2023-08-27 DIAGNOSIS — Z1231 Encounter for screening mammogram for malignant neoplasm of breast: Secondary | ICD-10-CM | POA: Diagnosis not present

## 2023-08-27 LAB — HM MAMMOGRAPHY

## 2023-08-27 LAB — HM PAP SMEAR

## 2023-08-28 NOTE — Telephone Encounter (Signed)
 Abstract

## 2023-09-03 ENCOUNTER — Encounter: Payer: Self-pay | Admitting: *Deleted

## 2023-09-05 DIAGNOSIS — L438 Other lichen planus: Secondary | ICD-10-CM | POA: Diagnosis not present

## 2023-09-05 DIAGNOSIS — Z4802 Encounter for removal of sutures: Secondary | ICD-10-CM | POA: Diagnosis not present

## 2023-09-15 ENCOUNTER — Encounter: Payer: Self-pay | Admitting: Family Medicine

## 2023-09-15 DIAGNOSIS — M1711 Unilateral primary osteoarthritis, right knee: Secondary | ICD-10-CM | POA: Diagnosis not present

## 2023-09-16 ENCOUNTER — Encounter: Payer: Self-pay | Admitting: Emergency Medicine

## 2023-09-17 DIAGNOSIS — M1712 Unilateral primary osteoarthritis, left knee: Secondary | ICD-10-CM | POA: Diagnosis not present

## 2023-11-16 ENCOUNTER — Other Ambulatory Visit: Payer: Self-pay | Admitting: Family Medicine

## 2023-11-16 DIAGNOSIS — M791 Myalgia, unspecified site: Secondary | ICD-10-CM

## 2023-11-16 DIAGNOSIS — I10 Essential (primary) hypertension: Secondary | ICD-10-CM

## 2023-11-16 DIAGNOSIS — Z780 Asymptomatic menopausal state: Secondary | ICD-10-CM

## 2023-12-18 ENCOUNTER — Other Ambulatory Visit: Payer: Self-pay | Admitting: Family Medicine

## 2024-01-03 IMAGING — MG MM DIGITAL SCREENING BILAT W/ TOMO AND CAD
6 of 10 series · 6 of 30 positions shown · non-contrast
Comparison: Previous exam(s).

CLINICAL DATA: Screening.

EXAM:
DIGITAL SCREENING BILATERAL MAMMOGRAM WITH TOMOSYNTHESIS AND CAD
TECHNIQUE: Bilateral screening digital craniocaudal and mediolateral oblique
mammograms were obtained. Bilateral screening digital breast
tomosynthesis was performed. The images were evaluated with
computer-aided detection.

[R MLO synth-2D (1 of 2)]
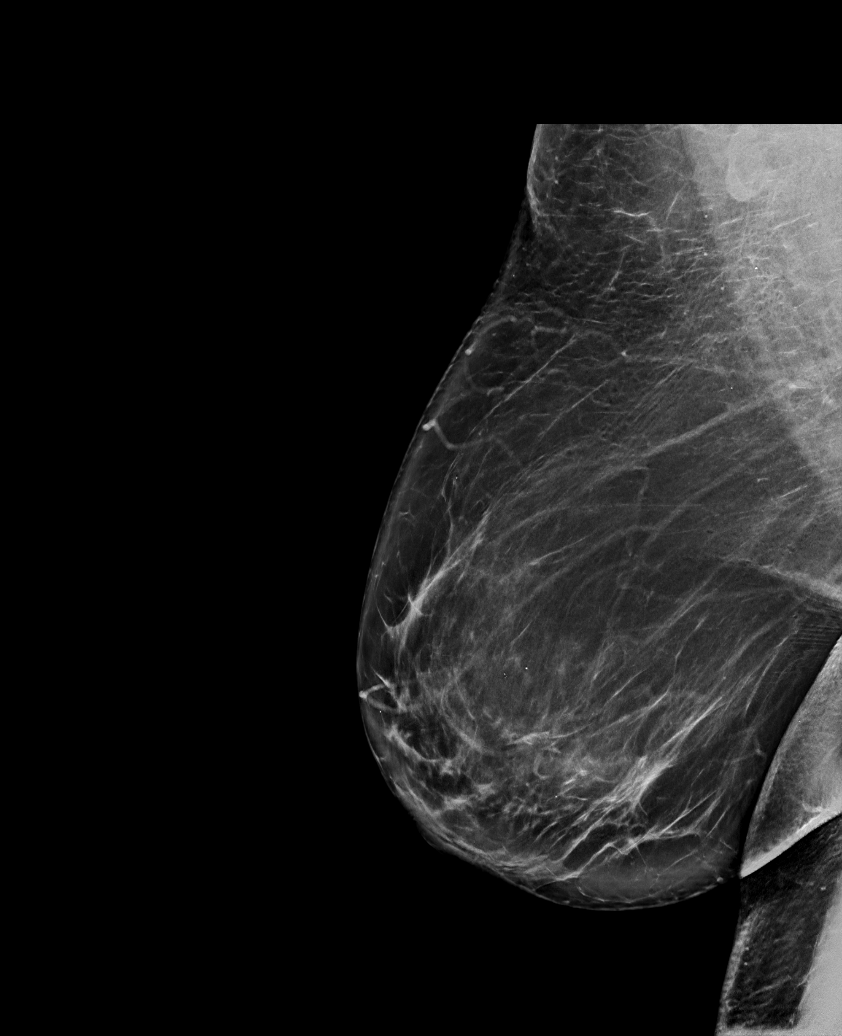

[R CC synth-2D]
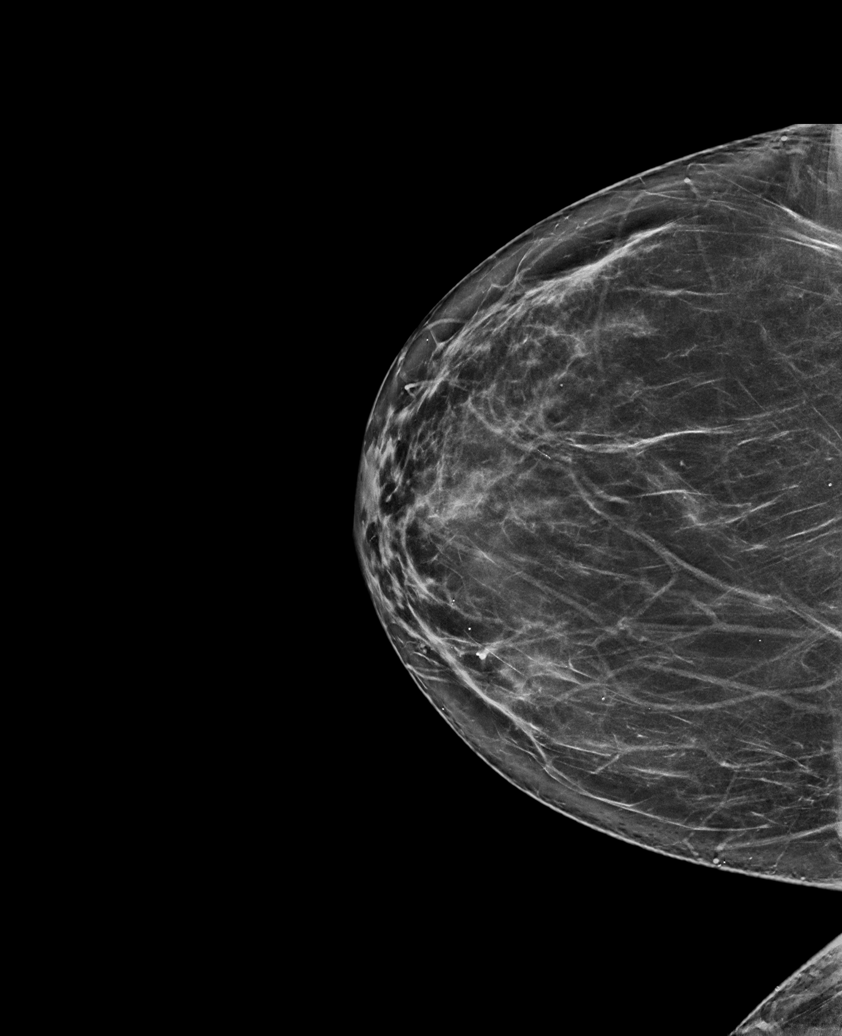

[R MLO synth-2D (2 of 2)]
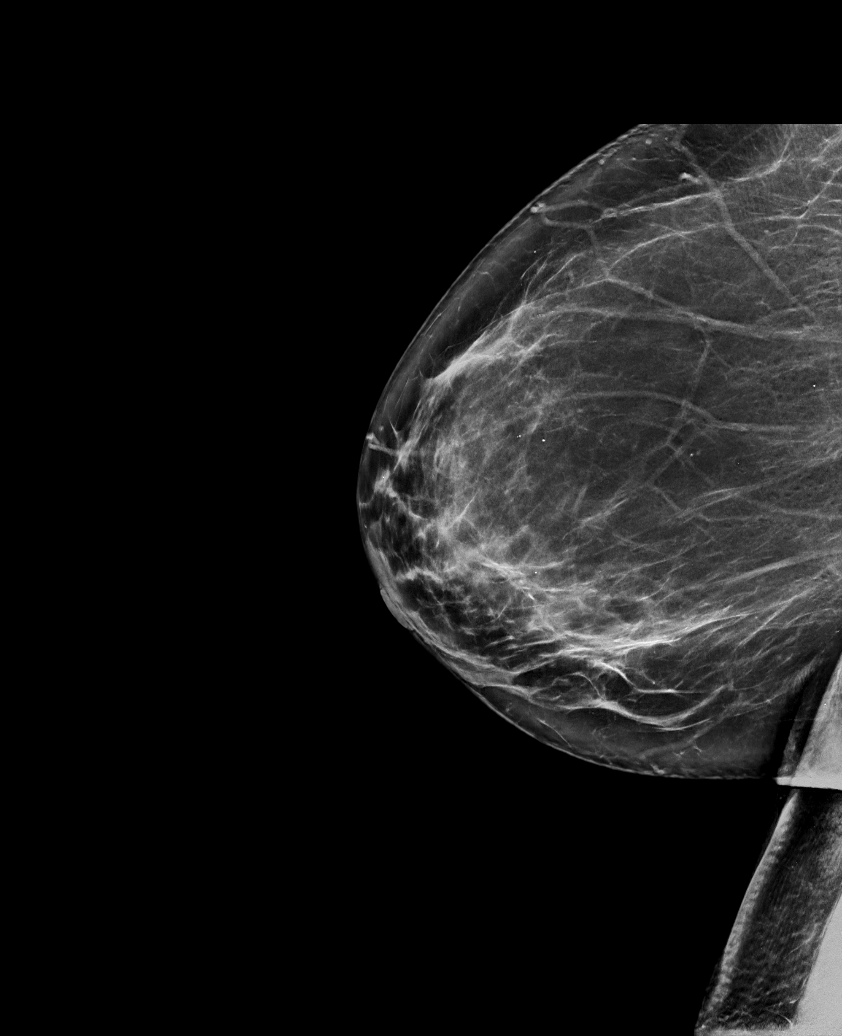

[L CC synth-2D]
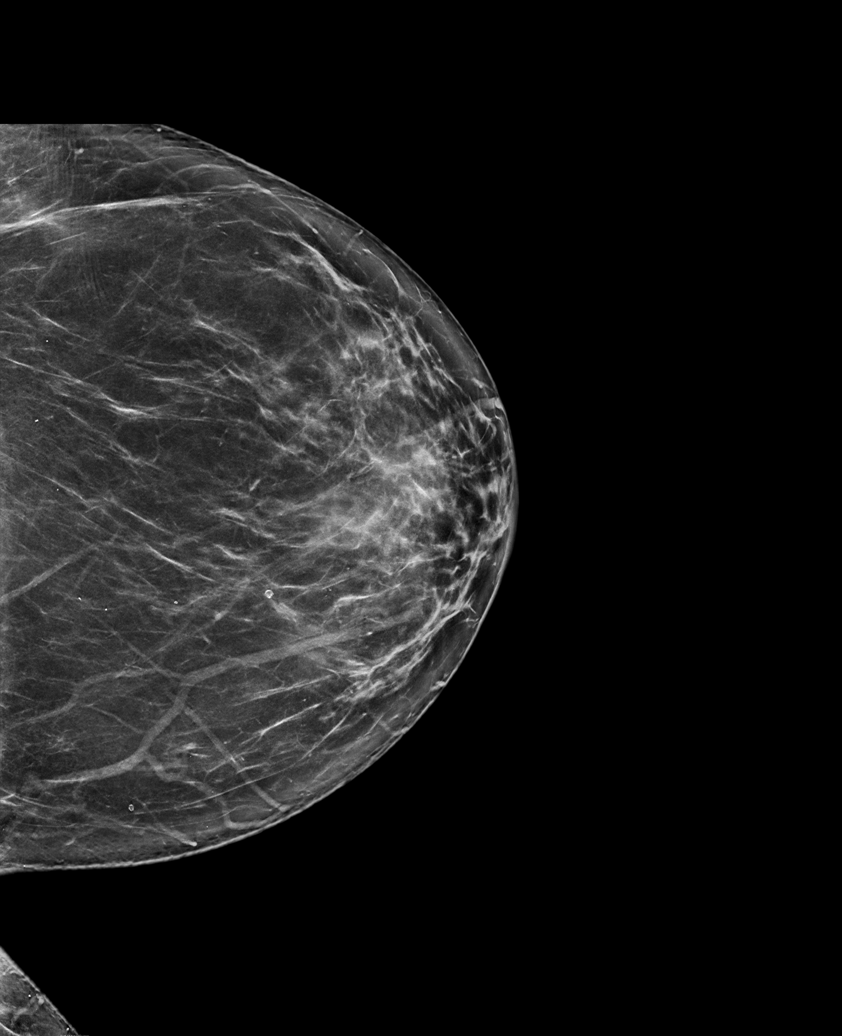

[L MLO synth-2D]
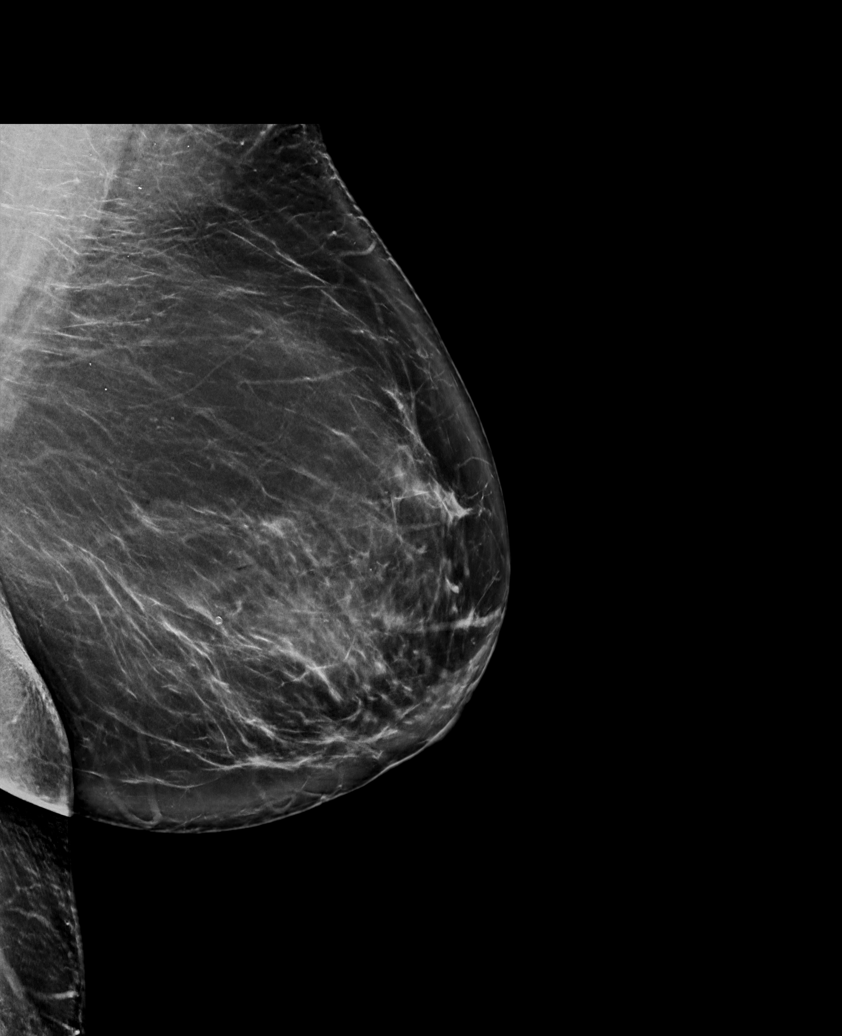

[L CC tomo · tomo slice 41/80.0]
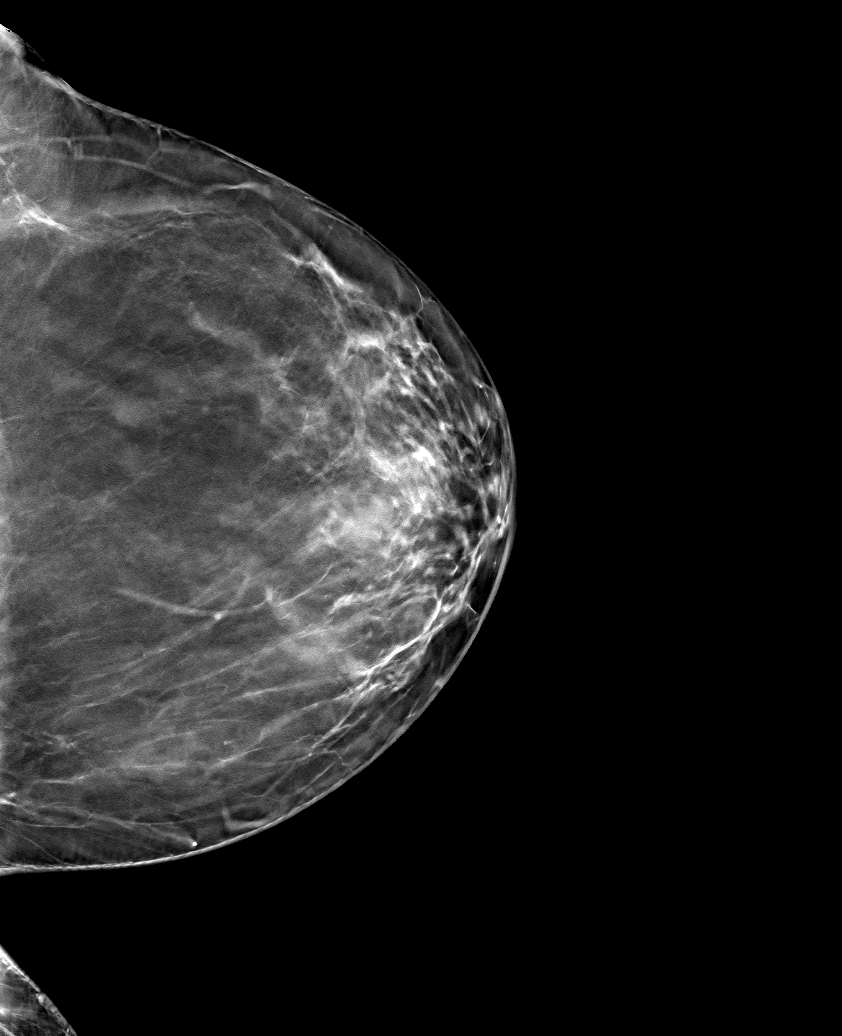

[6 of 30 positions shown; findings below may reference images not displayed]

ACR Breast Density Category b: There are scattered areas of
fibroglandular density.
FINDINGS: There are no findings suspicious for malignancy.
IMPRESSION: No mammographic evidence of malignancy. A result letter of this
screening mammogram will be mailed directly to the patient.

RECOMMENDATION:
Screening mammogram in one year. (Code:51-O-LD2)

BI-RADS CATEGORY  1: Negative.

## 2024-01-18 ENCOUNTER — Other Ambulatory Visit: Payer: Self-pay | Admitting: Family Medicine

## 2024-03-20 IMAGING — CT CT FOOT*L* W/O CM
3 series · 9 of 34 positions shown, 10 images · non-contrast
Comparison: 04/11/2021

CLINICAL DATA: Left foot pain.

EXAM:
CT OF THE LEFT FOOT WITHOUT CONTRAST
TECHNIQUE: Multidetector CT imaging of the left foot was performed according to
the standard protocol. Multiplanar CT image reconstructions were
also generated.
RADIATION DOSE REDUCTION: This exam was performed according to the
departmental dose-optimization program which includes automated
exposure control, adjustment of the mA and/or kV according to
patient size and/or use of iterative reconstruction technique.

[Series 5: sfov lower extremity 2.00 br40 s3 soft · axial · 0.19mm/px · z∈[+542,+542]mm · 1 of 67 slices shown, 2 images (1 of 3)]
[im 36/67  soft-tissue]
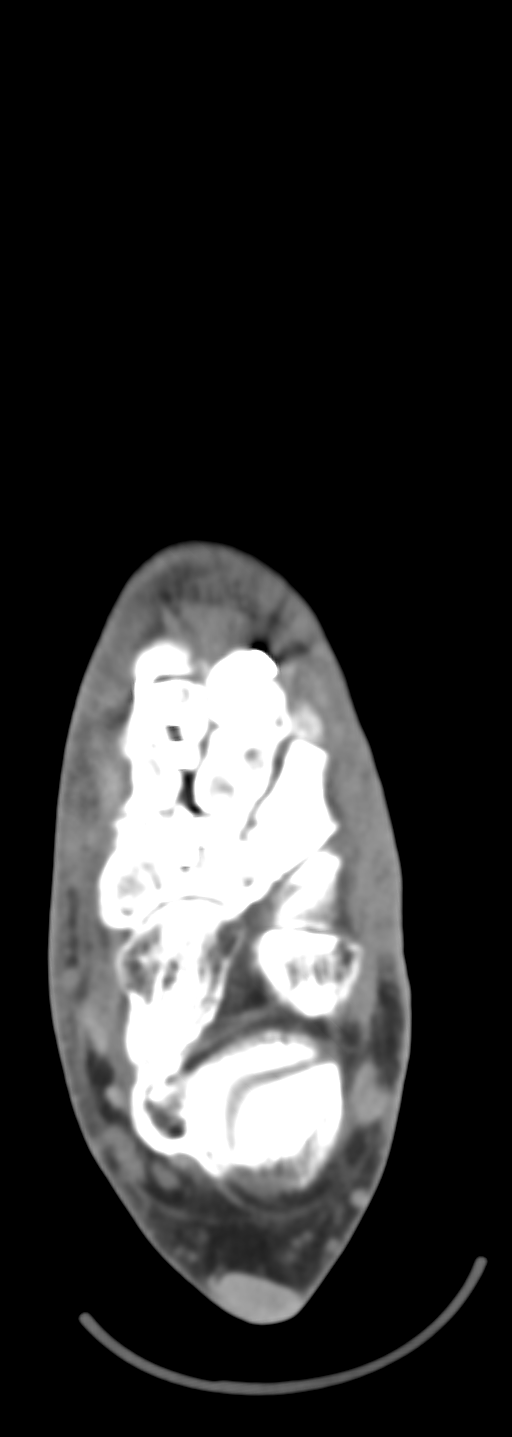
[im 36/67  bone]
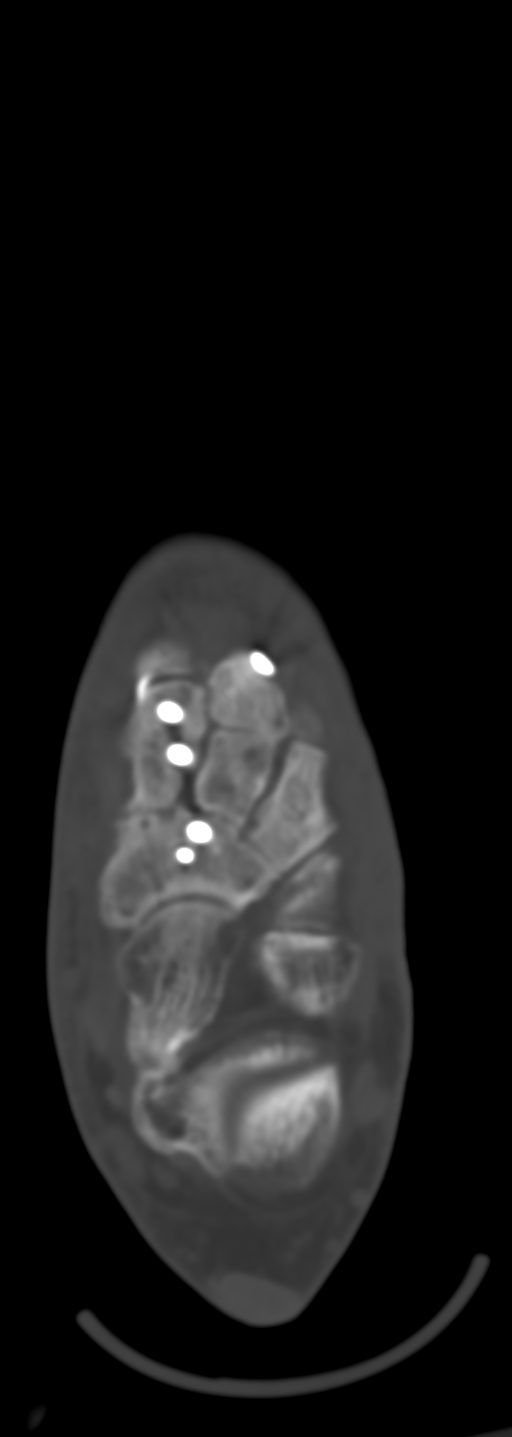

[Series 9: sfov lower extremity 2.00 br40 s3 soft · coronal · 0.19mm/px · 3 of 139 slices shown (2 of 3)]
[im 28/139  bone]
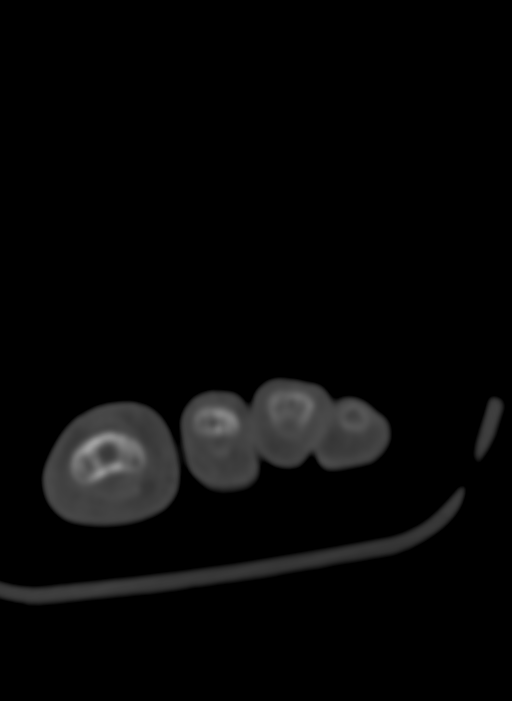
[im 56/139  bone]
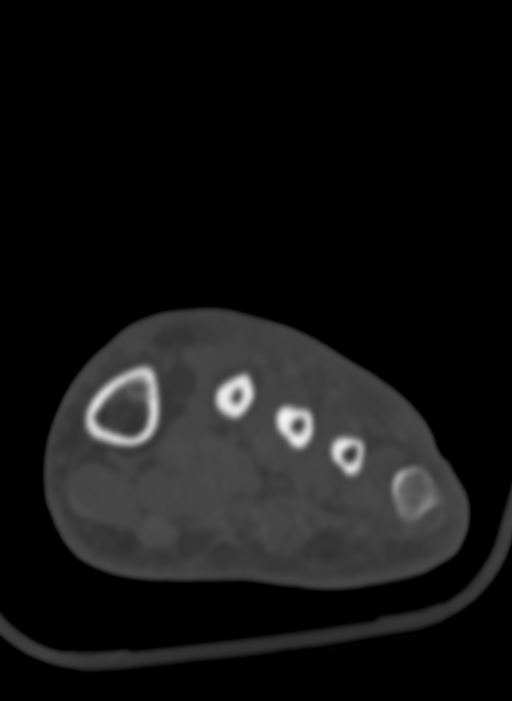
[im 83/139  bone]
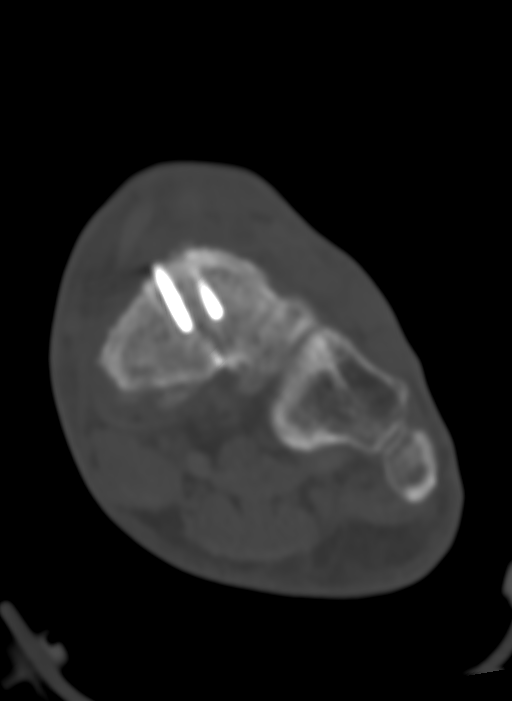

[Series 13: sfov lower extremity 2.00 br40 s3 soft · sagittal · 0.27mm/px · 5 of 60 slices shown (3 of 3)]
[im 20/60  bone]
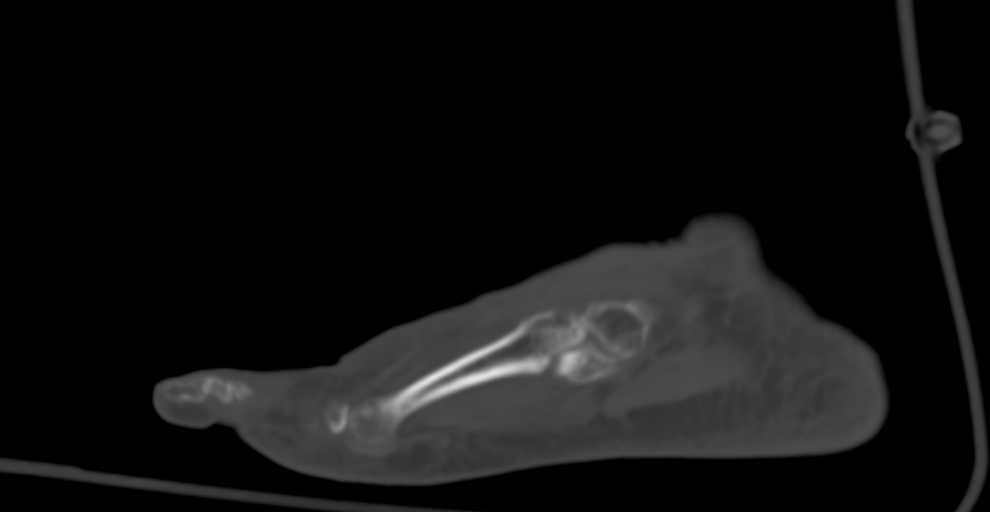
[im 25/60  bone]
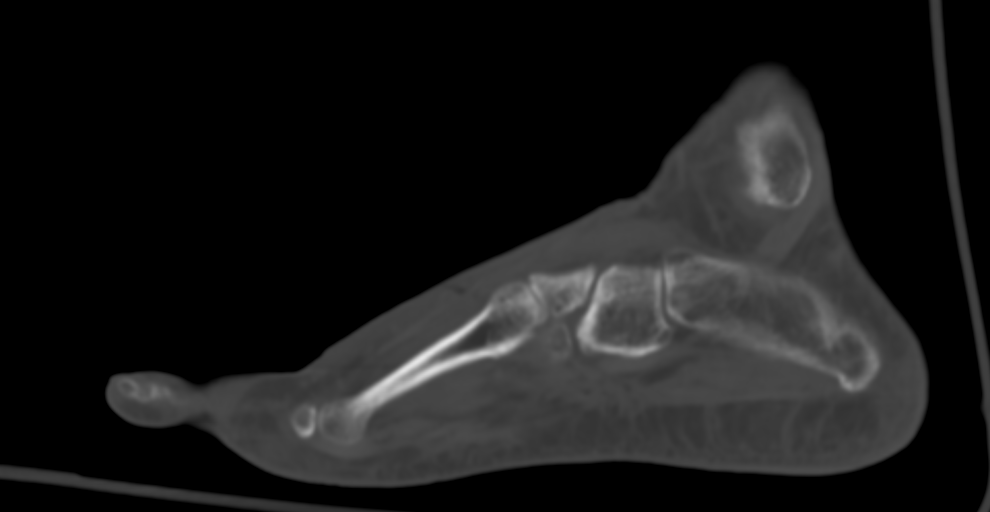
[im 30/60  bone]
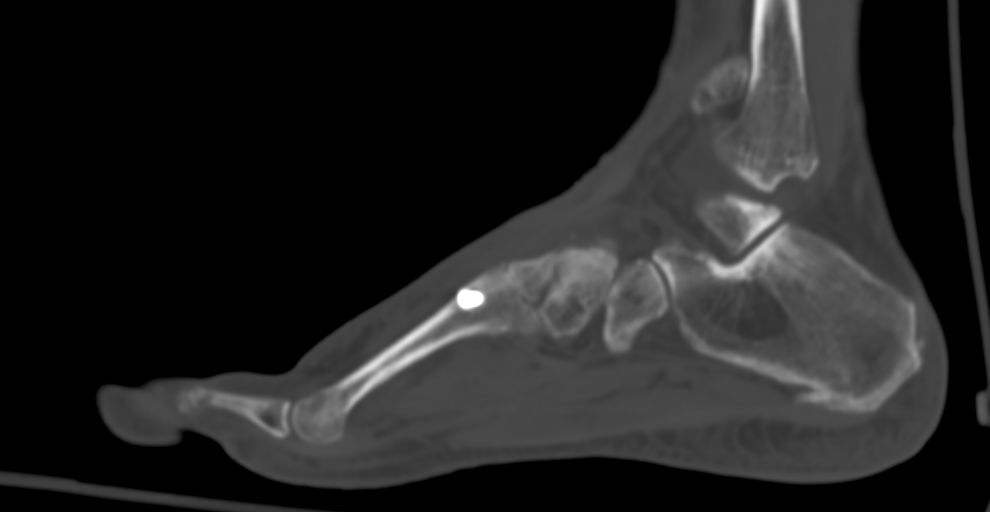
[im 35/60  bone]
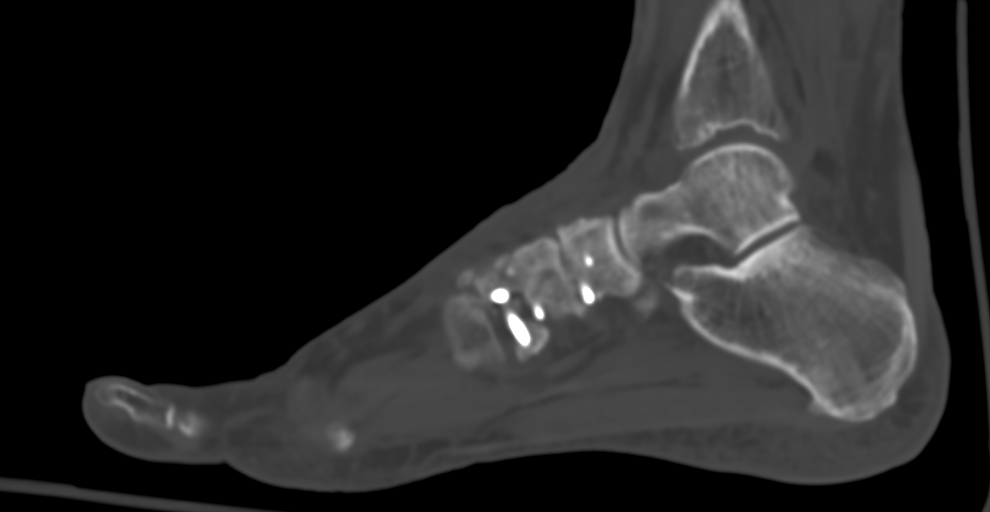
[im 40/60  bone]
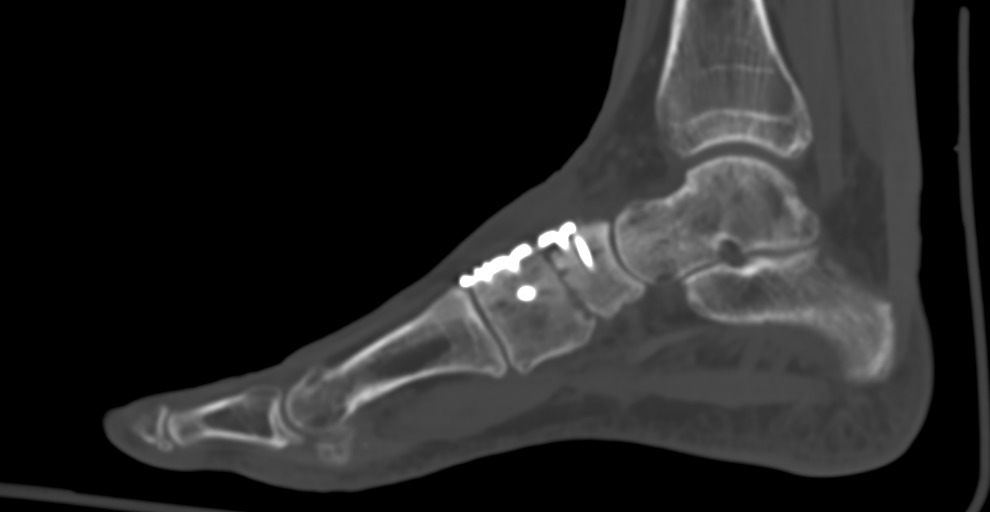

[9 of 34 positions shown; findings below may reference images not displayed]

FINDINGS: Bones/Joint/Cartilage

Interval arthrodesis of the navicular-medial cuneiform joint
transfixed with a dorsal side plate and interlocking screws without
significant bone bridging across the joint space. Lucency around the
screws involving the navicular concerning for loosening.

Single screw transfixing the base of the second metatarsal and
medial cuneiform without hardware failure or complication.

Normal alignment.  No joint effusion.

Mild osteoarthritis of the first MTP joint. Small plantar calcaneal
spur. Enthesopathic changes of the Achilles tendon insertion.
Generalized osteopenia.

Ligaments

Ligaments are suboptimally evaluated by CT.

Muscles and Tendons
Muscles are normal. No muscle atrophy. No intramuscular fluid
collection or hematoma. Flexor, extensor, peroneal and Achilles
tendons are intact.

Soft tissue
No fluid collection or hematoma.  No soft tissue mass.
IMPRESSION: 1. Interval arthrodesis of the navicular-medial cuneiform joint
transfixed with a dorsal side plate and interlocking screws without
significant bone bridging across the joint space. Lucency around the
screws involving the navicular concerning for loosening.
2. Single screw transfixing the base of the second metatarsal and
medial cuneiform without hardware failure or complication.
3.  No acute osseous injury of the left foot.

## 2024-05-14 ENCOUNTER — Other Ambulatory Visit: Payer: Self-pay | Admitting: Family Medicine

## 2024-05-14 DIAGNOSIS — I10 Essential (primary) hypertension: Secondary | ICD-10-CM

## 2024-05-14 DIAGNOSIS — M791 Myalgia, unspecified site: Secondary | ICD-10-CM

## 2024-05-14 DIAGNOSIS — Z780 Asymptomatic menopausal state: Secondary | ICD-10-CM

## 2024-06-29 DIAGNOSIS — K219 Gastro-esophageal reflux disease without esophagitis: Secondary | ICD-10-CM

## 2024-06-29 MED ORDER — PANTOPRAZOLE SODIUM 40 MG PO TBEC: 40.0000 mg | DELAYED_RELEASE_TABLET | Freq: Every day | ORAL | 3 refills | Status: AC

## 2024-08-10 ENCOUNTER — Other Ambulatory Visit: Payer: Medicare Other

## 2024-08-12 ENCOUNTER — Encounter: Payer: Medicare Other | Admitting: Family Medicine

## 2024-08-17 ENCOUNTER — Other Ambulatory Visit

## 2024-08-19 ENCOUNTER — Encounter: Admitting: Student
